# Patient Record
Sex: Female | Born: 1960
Health system: Southern US, Community
[De-identification: ages and names within clinical notes are randomized; demographics above are authoritative.]

## PROBLEM LIST (undated history)

## (undated) DIAGNOSIS — Z9889 Other specified postprocedural states: Secondary | ICD-10-CM

## (undated) DIAGNOSIS — E785 Hyperlipidemia, unspecified: Secondary | ICD-10-CM

## (undated) DIAGNOSIS — R112 Nausea with vomiting, unspecified: Secondary | ICD-10-CM

## (undated) DIAGNOSIS — M1990 Unspecified osteoarthritis, unspecified site: Secondary | ICD-10-CM

## (undated) DIAGNOSIS — N951 Menopausal and female climacteric states: Secondary | ICD-10-CM

## (undated) DIAGNOSIS — E109 Type 1 diabetes mellitus without complications: Secondary | ICD-10-CM

## (undated) DIAGNOSIS — E669 Obesity, unspecified: Secondary | ICD-10-CM

## (undated) DIAGNOSIS — E559 Vitamin D deficiency, unspecified: Secondary | ICD-10-CM

## (undated) DIAGNOSIS — E039 Hypothyroidism, unspecified: Secondary | ICD-10-CM

## (undated) DIAGNOSIS — G20 Parkinson's disease: Secondary | ICD-10-CM

## (undated) DIAGNOSIS — G20A1 Parkinson's disease without dyskinesia, without mention of fluctuations: Secondary | ICD-10-CM

## (undated) DIAGNOSIS — D649 Anemia, unspecified: Secondary | ICD-10-CM

## (undated) HISTORY — DX: Hypothyroidism, unspecified: E03.9

## (undated) HISTORY — DX: Anemia, unspecified: D64.9

## (undated) HISTORY — PX: OTHER SURGICAL HISTORY: SHX169

## (undated) HISTORY — DX: Type 1 diabetes mellitus without complications: E10.9

## (undated) HISTORY — DX: Unspecified osteoarthritis, unspecified site: M19.90

## (undated) HISTORY — DX: Vitamin D deficiency, unspecified: E55.9

## (undated) HISTORY — PX: BREAST EXCISIONAL BIOPSY: SUR124

## (undated) HISTORY — PX: APPENDECTOMY: SHX54

## (undated) HISTORY — DX: Menopausal and female climacteric states: N95.1

## (undated) HISTORY — PX: TONSILLECTOMY: SUR1361

## (undated) HISTORY — DX: Obesity, unspecified: E66.9

## (undated) HISTORY — DX: Hyperlipidemia, unspecified: E78.5

---

## 2010-10-11 LAB — HM COLONOSCOPY

## 2013-06-22 DIAGNOSIS — E039 Hypothyroidism, unspecified: Secondary | ICD-10-CM

## 2013-06-22 DIAGNOSIS — E1069 Type 1 diabetes mellitus with other specified complication: Secondary | ICD-10-CM | POA: Insufficient documentation

## 2013-06-22 DIAGNOSIS — E1065 Type 1 diabetes mellitus with hyperglycemia: Secondary | ICD-10-CM | POA: Insufficient documentation

## 2013-06-22 DIAGNOSIS — E785 Hyperlipidemia, unspecified: Secondary | ICD-10-CM

## 2013-06-22 DIAGNOSIS — E782 Mixed hyperlipidemia: Secondary | ICD-10-CM

## 2013-06-22 DIAGNOSIS — E109 Type 1 diabetes mellitus without complications: Secondary | ICD-10-CM

## 2013-06-22 DIAGNOSIS — N951 Menopausal and female climacteric states: Secondary | ICD-10-CM

## 2013-06-22 DIAGNOSIS — E66811 Obesity, class 1: Secondary | ICD-10-CM | POA: Insufficient documentation

## 2013-06-22 DIAGNOSIS — E669 Obesity, unspecified: Secondary | ICD-10-CM

## 2013-06-22 HISTORY — DX: Menopausal and female climacteric states: N95.1

## 2013-06-22 HISTORY — DX: Obesity, class 1: E66.811

## 2013-06-22 HISTORY — DX: Type 1 diabetes mellitus without complications: E10.9

## 2013-06-22 HISTORY — DX: Obesity, unspecified: E66.9

## 2013-06-22 HISTORY — DX: Hypothyroidism, unspecified: E03.9

## 2013-06-22 HISTORY — DX: Hyperlipidemia, unspecified: E78.5

## 2013-09-24 DIAGNOSIS — E559 Vitamin D deficiency, unspecified: Secondary | ICD-10-CM | POA: Insufficient documentation

## 2013-09-24 HISTORY — DX: Vitamin D deficiency, unspecified: E55.9

## 2016-04-23 LAB — HM DIABETES EYE EXAM

## 2016-07-27 ENCOUNTER — Telehealth: Payer: Self-pay | Admitting: Surgical

## 2016-07-27 NOTE — Telephone Encounter (Signed)
Pre Visit call attempted. When I called the patient she could not hear me. I ask her to call back if she could hear me.

## 2016-07-30 ENCOUNTER — Encounter: Payer: Self-pay | Admitting: Family Medicine

## 2016-07-30 ENCOUNTER — Ambulatory Visit (INDEPENDENT_AMBULATORY_CARE_PROVIDER_SITE_OTHER): Payer: 59 | Admitting: Family Medicine

## 2016-07-30 VITALS — BP 108/70 | HR 69 | Temp 97.8°F | Ht 63.5 in | Wt 182.2 lb

## 2016-07-30 DIAGNOSIS — E039 Hypothyroidism, unspecified: Secondary | ICD-10-CM

## 2016-07-30 DIAGNOSIS — E669 Obesity, unspecified: Secondary | ICD-10-CM | POA: Diagnosis not present

## 2016-07-30 DIAGNOSIS — I8390 Asymptomatic varicose veins of unspecified lower extremity: Secondary | ICD-10-CM

## 2016-07-30 DIAGNOSIS — E109 Type 1 diabetes mellitus without complications: Secondary | ICD-10-CM

## 2016-07-30 DIAGNOSIS — E559 Vitamin D deficiency, unspecified: Secondary | ICD-10-CM

## 2016-07-30 DIAGNOSIS — N951 Menopausal and female climacteric states: Secondary | ICD-10-CM

## 2016-07-30 DIAGNOSIS — E782 Mixed hyperlipidemia: Secondary | ICD-10-CM

## 2016-07-30 DIAGNOSIS — E66811 Obesity, class 1: Secondary | ICD-10-CM

## 2016-07-30 LAB — POCT GLYCOSYLATED HEMOGLOBIN (HGB A1C): Hemoglobin A1C: 7.4

## 2016-07-30 MED ORDER — LEVOTHYROXINE SODIUM 125 MCG PO TABS: ORAL_TABLET | ORAL | 3 refills | Status: DC

## 2016-07-30 MED ORDER — BLOOD GLUCOSE TEST VI STRP: ORAL_STRIP | 6 refills | Status: DC

## 2016-07-30 NOTE — Progress Notes (Signed)
Chelsea Mercado is a 56 y.o. female is here to Chelsea Mercado.   Patient Care Team: Briscoe Deutscher, DO as PCP - General (Family Medicine)   History of Present Illness:   Shaune Pascal CMA acting as scribe for Dr. Juleen China.  HPI:   Patient comes in today to establish care. She just moved to this area from [place] yesterday. She needs a referral to Endocrinology for Type 1 diabetes. She is on the pump for her diabetes. This pump is 56 year old. No other concerns today. Referral placed today.   Current symptoms: no polyuria or polydipsia, no chest pain, dyspnea or TIA's, no numbness, tingling or pain in extremities.  Taking medication compliantly without noted sided effects [x]   YES  []   NO  Episodes of hypoglycemia? []   YES  [x]   NO Maintaining a diabetic diet? [x]   YES  []   NO Trying to exercise on a regular basis? [x]   YES  []   NO  On ACE inhibitor or angiotensin II receptor blocker? [x]   YES  []   NO On Aspirin? []   YES  [x]   NO  She would like to be referred for her varicose veins. They do not give her any problems just concerned about the look. Referral placed today.   Health Maintenance Due  Topic Date Due  . Hepatitis C Screening  10/28/1960  . PNEUMOCOCCAL POLYSACCHARIDE VACCINE (1) 08/01/1962  . FOOT EXAM  08/01/1970  . OPHTHALMOLOGY EXAM  08/01/1970  . URINE MICROALBUMIN  08/01/1970  . HIV Screening  08/01/1975  . TETANUS/TDAP  08/01/1979  . PAP SMEAR  07/31/1981  . MAMMOGRAM  08/01/2010  . COLONOSCOPY  08/01/2010   PMHx, SurgHx, SocialHx, Medications, and Allergies were reviewed in the Visit Navigator and updated as appropriate.   Past Medical History:  Diagnosis Date  . Controlled type 1 diabetes mellitus without complication ([place]) 16/07/3708  . Hyperlipidemia 06/22/2013  . Obesity, Class I, BMI 30-34.9 06/22/2013  . Post menopausal syndrome 06/22/2013  . Primary hypothyroidism 06/22/2013  . Vitamin D deficiency 09/24/2013   No past surgical history on  file. No family history on file.   Social History  Substance Use Topics  . Smoking status: Never Smoker  . Smokeless tobacco: Never Used  . Alcohol use No   Current Medications and Allergies:   .  Cholecalciferol (VITAMIN D3) 5000 units CAPS, Take by mouth., Disp: , Rfl:  .  Glucose Blood (BLOOD GLUCOSE TEST STRIPS) STRP, 6-7 times daily, Disp: 100 each, Rfl: 6 .  insulin lispro (HUMALOG) 100 UNIT/ML injection, Per pump take maximum 50 units daily, Disp: , Rfl:  .  Ketoprofen 10 % CREA, by Does not apply route., Disp: , Rfl:  .  levothyroxine (SYNTHROID, LEVOTHROID) 125 MCG tablet, Take 1 tab by mouth once daily, Disp: 90 tablet, Rfl: 3 .  naproxen (NAPROSYN) 500 MG tablet, Take by mouth., Disp: , Rfl:  .  pravastatin (PRAVACHOL) 40 MG tablet, Take by mouth., Disp: , Rfl:  .  ramipril (ALTACE) 10 MG capsule, 1 tab once daily, Disp: , Rfl:   Allergies  Allergen Reactions  . Hydrocodone-Acetaminophen Nausea And Vomiting  . Propoxyphene Nausea Only   Review of Systems:   Pertinent items are noted in the HPI. Otherwise, ROS is negative.  Vitals:   Vitals:   07/30/16 1108  BP: 108/70  Pulse: 69  Temp: 97.8 F (36.6 C)  TempSrc: Oral  SpO2: 96%  Weight: 182 lb 3.2 oz (82.6 kg)  Height: 5'  3.5" (1.613 m)     Body mass index is 31.77 kg/m.  Physical Exam:   Physical Exam  Constitutional: She is oriented to person, place, and time. She appears well-developed and well-nourished. No distress.  HENT:  Head: Normocephalic and atraumatic.  Eyes: Pupils are equal, round, and reactive to light. EOM are normal.  Neck: Normal range of motion. Neck supple.  Cardiovascular: Normal rate, regular rhythm, normal heart sounds and intact distal pulses.   Pulmonary/Chest: Effort normal and breath sounds normal.  Abdominal: Soft. Bowel sounds are normal.  Musculoskeletal: Normal range of motion.  Neurological: She is alert and oriented to person, place, and time.  Skin: Skin is warm.   Psychiatric: She has a normal mood and affect. Her behavior is normal.  Nursing note and vitals reviewed.  Results for orders placed or performed in visit on 07/30/16  POCT glycosylated hemoglobin (Hb A1C)  Result Value Ref Range   Hemoglobin A1C 7.4    Assessment and Plan:   Problem  Varicose Vein of Leg   Multiple superficial veins, some of which are inflamed. No red flags. The patient would like referral to the intravascular. Referral placed today.   Vitamin D Deficiency   History of the same with a low vitamin D of 8 per the patient. She continues to take daily vitamin D. Concerns today.   Controlled Type 1 Diabetes Mellitus Without Complication ([facility])   Referral for endocrinology placed today. The patient is using an insulin pump. She has no concerns today she does not use a continuous glucose monitor. Her last A1c was documented at 7.6.  Lab Results  Component Value Date   HGBA1C 7.4 07/30/2016     Hyperlipidemia   Well controlled.  No signs of complications, medication side effects, or red flags.  Continue current regimen.      Obesity, Class I, Bmi 30-34.9   The patient is asked to make an attempt to improve diet and exercise patterns to aid in medical management of this problem.    Primary Hypothyroidism   The patient denies unintentional weight change, fatigue, skin changes, bowel changes, or other concerns. She takes her medication daily. She is not due for a recheck labs today. She does need a new prescription refill today.   Post Menopausal Syndrome (Resolved)   The patient denies any new symptoms or concerns.     . Reviewed expectations re: course of current medical issues. . Discussed self-management of symptoms. . Outlined signs and symptoms indicating need for more acute intervention. . Patient verbalized understanding and all questions were answered. Marland Kitchen Health Maintenance issues including appropriate healthy diet, exercise, and smoking avoidance were  discussed with patient. . See orders for this visit as documented in the electronic medical record. . Patient received an After Visit Summary.  CMA served as Education administrator during this visit. History, Physical, and Plan performed by medical provider. The above documentation has been reviewed and is accurate and complete. Briscoe Deutscher, D.O.  Briscoe Deutscher, DO [place] [place] 07/30/2016  Future Appointments Date Time Provider [place]  10/30/2016 9:45 AM Briscoe Deutscher, DO [facility] None

## 2016-08-03 ENCOUNTER — Encounter: Payer: Self-pay | Admitting: Surgical

## 2016-08-10 ENCOUNTER — Other Ambulatory Visit: Payer: Self-pay

## 2016-08-10 DIAGNOSIS — I8311 Varicose veins of right lower extremity with inflammation: Secondary | ICD-10-CM

## 2016-08-10 DIAGNOSIS — I8312 Varicose veins of left lower extremity with inflammation: Principal | ICD-10-CM

## 2016-08-20 ENCOUNTER — Encounter: Payer: Self-pay | Admitting: Vascular Surgery

## 2016-08-20 ENCOUNTER — Ambulatory Visit (INDEPENDENT_AMBULATORY_CARE_PROVIDER_SITE_OTHER): Payer: 59 | Admitting: Vascular Surgery

## 2016-08-20 VITALS — BP 117/81 | HR 86 | Temp 98.0°F | Resp 18 | Ht 64.0 in | Wt 185.0 lb

## 2016-08-20 DIAGNOSIS — I83893 Varicose veins of bilateral lower extremities with other complications: Secondary | ICD-10-CM | POA: Insufficient documentation

## 2016-08-20 DIAGNOSIS — I8393 Asymptomatic varicose veins of bilateral lower extremities: Secondary | ICD-10-CM

## 2016-08-20 NOTE — Progress Notes (Signed)
Subjective:     Patient ID: Chelsea Mercado, female   DOB: 1960/01/21, 56 y.o.   MRN: [id number]  HPI This 56 year old female was referred by Dr. Juleen China for evaluation of bilateral varicose veins with inflammation. The patient is a juvenile diabetic and has an insulin pump. She has no history of DVT thrombophlebitis stasis ulcers or bleeding. She does develop nocturnal leg cramps. She has had spider veins in both legs for many years but has never had any treatment. She takes no anticoagulants.  Past Medical History:  Diagnosis Date  . Anemia   . Arthritis   . Controlled type 1 diabetes mellitus without complication ([place]) 64/40/3474  . Hyperlipidemia 06/22/2013  . Obesity, Class I, BMI 30-34.9 06/22/2013  . Post menopausal syndrome 06/22/2013  . Primary hypothyroidism 06/22/2013  . Vitamin D deficiency 09/24/2013    Social History  Substance Use Topics  . Smoking status: Never Smoker  . Smokeless tobacco: Never Used  . Alcohol use No    Family History  Problem Relation Age of Onset  . Heart disease Mother   . Hypertension Mother   . High Cholesterol Mother   . Heart disease Maternal Grandfather     Allergies  Allergen Reactions  . Hydrocodone-Acetaminophen Nausea And Vomiting  . Propoxyphene Nausea Only     Current Outpatient Prescriptions:  .  Cholecalciferol (VITAMIN D3) 5000 units CAPS, Take by mouth., Disp: , Rfl:  .  Glucose Blood (BLOOD GLUCOSE TEST STRIPS) STRP, 6-7 times daily, Disp: 100 each, Rfl: 6 .  insulin lispro (HUMALOG) 100 UNIT/ML injection, Per pump take maximum 50 units daily, Disp: , Rfl:  .  Ketoprofen 10 % CREA, by Does not apply route., Disp: , Rfl:  .  levothyroxine (SYNTHROID, LEVOTHROID) 125 MCG tablet, Take 1 tab by mouth once daily, Disp: 90 tablet, Rfl: 3 .  naproxen (NAPROSYN) 500 MG tablet, Take by mouth., Disp: , Rfl:  .  pravastatin (PRAVACHOL) 40 MG tablet, Take by mouth., Disp: , Rfl:  .  ramipril (ALTACE) 10 MG capsule, 1 tab once daily,  Disp: , Rfl:   Vitals:   08/20/16 1139  BP: 117/81  Pulse: 86  Resp: 18  Temp: 98 F (36.7 C)  TempSrc: Oral  SpO2: 96%  Weight: 185 lb (83.9 kg)  Height: 5\' 4"  (1.626 m)    Body mass index is 31.76 kg/m.         Review of Systems denies chest pain, dyspnea on exertion, PND, orthopnea, hemoptysis. See history of present illness    Objective:   Physical Exam BP 117/81 (BP Location: Left Arm, Patient Position: Sitting, Cuff Size: Normal)   Pulse 86   Temp 98 F (36.7 C) (Oral)   Resp 18   Ht 5\' 4"  (1.626 m)   Wt 185 lb (83.9 kg)   SpO2 96%   BMI 31.76 kg/m     Gen.-alert and oriented x3 in no apparent distress HEENT normal for age Lungs no rhonchi or wheezing Cardiovascular regular rhythm no murmurs carotid pulses 3+ palpable no bruits audible Abdomen soft nontender no palpable masses-obese Musculoskeletal free of  major deformities Skin clear -no rashes Neurologic normal Lower extremities 3+ femoral and dorsalis pedis pulses palpable bilaterally with no edema Diffuse spider veins bilaterally in the medial lateral thighs medial lateral calf and posterior calf and popliteal fossa. No bulging varicosities noted. No hyperpigmentation or ulceration noted. Both feet well perfused.  Lap performed a bedside SonoSite ultrasound exam and visualized both great saphenous veins  which appear normal in caliber with no evidence of reflux       Assessment:     #1 bilateral spider veins #2 type 1 diabetes mellitus    Plan:     Discussed potential foam sclerotherapy with patient and she would like to explore this further and will meet with Thea Silversmith. Also explained to her that any symptoms which she is experiencing now may not resolve with treatment and that the treatment is primarily for cosmetic reasons

## 2016-09-17 ENCOUNTER — Encounter: Payer: Self-pay | Admitting: *Deleted

## 2016-09-19 ENCOUNTER — Encounter: Payer: Self-pay | Admitting: *Deleted

## 2016-09-19 ENCOUNTER — Ambulatory Visit (INDEPENDENT_AMBULATORY_CARE_PROVIDER_SITE_OTHER): Payer: Self-pay | Admitting: *Deleted

## 2016-09-19 DIAGNOSIS — I8393 Asymptomatic varicose veins of bilateral lower extremities: Secondary | ICD-10-CM

## 2016-09-19 NOTE — Progress Notes (Signed)
X=.3% Sotradecol administered with a 27g butterfly.  Patient received a total of 6cc.  Treated major areas of concern with one syringe. Easy access. Tol well. She'll need more tx in the future. Will follow this nice lady prn.    Compression stockings applied: Yes.

## 2016-09-20 ENCOUNTER — Telehealth: Payer: Self-pay | Admitting: Family Medicine

## 2016-09-20 MED ORDER — INSULIN LISPRO 100 UNIT/ML ~~LOC~~ SOLN: SUBCUTANEOUS | 1 refills | Status: DC

## 2016-09-20 NOTE — Telephone Encounter (Signed)
Medication sent into pharmacy for patient.

## 2016-09-20 NOTE — Telephone Encounter (Signed)
MEDICATION: insulin lispro (HUMALOG) 100 UNIT/ML injection [[id number]]   PHARMACY:   [id number] - [place], [place] - [address] [phone] (Phone) [phone] (Fax)    IS THIS A 90 DAY SUPPLY :  N/A  IS PATIENT OUT OF MEDICATION:  No, only one bottle left.  IF NOT; HOW MUCH IS LEFT: One bottle.   LAST APPOINTMENT DATE: @7 /23/2018  NEXT APPOINTMENT DATE:@10 /23/2018  OTHER COMMENTS:  See's endo provider on 9/24, needs enough to last till then.    **Let patient know to contact pharmacy at the end of the day to make sure medication is ready. **  ** Please notify patient to allow 48-72 hours to process**  **Encourage patient to contact the pharmacy for refills or they can request refills through [facility]**

## 2016-10-01 ENCOUNTER — Encounter: Payer: Self-pay | Admitting: Internal Medicine

## 2016-10-01 ENCOUNTER — Ambulatory Visit (INDEPENDENT_AMBULATORY_CARE_PROVIDER_SITE_OTHER): Payer: 59 | Admitting: Internal Medicine

## 2016-10-01 VITALS — BP 110/60 | HR 83 | Ht 63.5 in | Wt 183.0 lb

## 2016-10-01 DIAGNOSIS — E039 Hypothyroidism, unspecified: Secondary | ICD-10-CM

## 2016-10-01 DIAGNOSIS — E559 Vitamin D deficiency, unspecified: Secondary | ICD-10-CM

## 2016-10-01 DIAGNOSIS — E1065 Type 1 diabetes mellitus with hyperglycemia: Secondary | ICD-10-CM | POA: Diagnosis not present

## 2016-10-01 DIAGNOSIS — E041 Nontoxic single thyroid nodule: Secondary | ICD-10-CM | POA: Insufficient documentation

## 2016-10-01 LAB — LIPID PANEL
CHOLESTEROL: 187 mg/dL (ref 0–200)
HDL: 56.8 mg/dL (ref >39.00)
LDL CALC: 106 mg/dL — AB (ref 0–99)
NonHDL: 129.89
Total CHOL/HDL Ratio: 3
Triglycerides: 119 mg/dL (ref 0.0–149.0)
VLDL: 23.8 mg/dL (ref 0.0–40.0)

## 2016-10-01 LAB — MICROALBUMIN / CREATININE URINE RATIO
Creatinine,U: 140 mg/dL
MICROALB/CREAT RATIO: 0.5 mg/g (ref 0.0–30.0)

## 2016-10-01 LAB — COMPLETE METABOLIC PANEL WITH GFR
AG Ratio: 1.4 (calc) (ref 1.0–2.5)
ALKALINE PHOSPHATASE (APISO): 60 U/L (ref 33–130)
ALT: 13 U/L (ref 6–29)
AST: 18 U/L (ref 10–35)
Albumin: 4 g/dL (ref 3.6–5.1)
BILIRUBIN TOTAL: 0.4 mg/dL (ref 0.2–1.2)
BUN: 15 mg/dL (ref 7–25)
CHLORIDE: 102 mmol/L (ref 98–110)
CO2: 26 mmol/L (ref 20–32)
CREATININE: 0.93 mg/dL (ref 0.50–1.05)
Calcium: 9.3 mg/dL (ref 8.6–10.4)
GFR, Est African American: 80 mL/min/{1.73_m2} (ref >=60)
GFR, Est Non African American: 69 mL/min/{1.73_m2} (ref >=60)
GLUCOSE: 158 mg/dL — AB (ref 65–99)
Globulin: 2.9 g/dL (calc) (ref 1.9–3.7)
Potassium: 4.5 mmol/L (ref 3.5–5.3)
Sodium: 136 mmol/L (ref 135–146)
Total Protein: 6.9 g/dL (ref 6.1–8.1)

## 2016-10-01 LAB — VITAMIN D 25 HYDROXY (VIT D DEFICIENCY, FRACTURES): VITD: 47.47 ng/mL (ref 30.00–100.00)

## 2016-10-01 MED ORDER — LEVOTHYROXINE SODIUM 125 MCG PO TABS: ORAL_TABLET | ORAL | 3 refills | Status: DC

## 2016-10-01 MED ORDER — GLUCAGON (RDNA) 1 MG IJ KIT: 1.0000 mg | PACK | Freq: Once | INTRAMUSCULAR | 12 refills | Status: DC | PRN

## 2016-10-01 MED ORDER — RAMIPRIL 10 MG PO CAPS: ORAL_CAPSULE | ORAL | 3 refills | Status: DC

## 2016-10-01 NOTE — Patient Instructions (Addendum)
Please continue: - basal rates: 12 AM: 0.775 units/h 5 AM: 0.9 9 AM: 0.7 - ICR: 1:10 - target: 100-120 - ISF: 50 - Insulin on Board: 3h  Please check sugars,  enter carbs, and bolus even for snacks.  Please move Levothyroxine 125 mcg to am.  Take the thyroid hormone every day, with water, at least 30 minutes before breakfast, separated by at least 4 hours from: - acid reflux medications - calcium - iron - multivitamins  Please call and schedule an eye appt with Dr. Prudencio Burly: [facility]:  Dr. Sherlyn Lick MD ?  Address: [address], [place], [place] [address]  [phone]

## 2016-10-01 NOTE — Progress Notes (Signed)
Patient ID: Chelsea Mercado, female   DOB: Nov 20, 1960, 56 y.o.   MRN: [id number]   HPI: Chelsea Mercado is a 56 y.o.-year-old female, referred by her PCP, Dr. Juleen China, for management of DM1, uncontrolled, without long term complications and hypothyroidism. She saw endocrinology before: Dr. Cherie Dark.   Patient has been diagnosed with DM1 at 56 y/o; she started on insulin at dx.   Last hemoglobin A1c was: Lab Results  Component Value Date   HGBA1C 7.4 07/30/2016  02/14/2016: HbA1c 7.6%  Pt is on an insulin pump: Medtronic 630G (wears it on arm) - last pump: since 2016-2017, without CGM (not enough space on abdomen), uses Humalog in the pump.  Pump settings: - basal rates: 12 AM: 0.775 units/h 5 AM: 0.9 9 AM: 0.7 - ICR: 1:10 - target: 100-120 - ISF: 50 - Insulin on Board: 3h - bolus wizard: on TDD from basal insulin: 59% (18 units) TDD from bolus insulin: 41% (12 units) - extended bolusing: not using - changes infusion site: q3-4 days  - Meter: Bayer Contour  Pt checks her sugars  2.4x a day - 137 +/- 38: - am: 85-156, 179 - 2h after b'fast: n/c - before lunch: 85-168, 195, 251 - 2h after lunch: n/c - before dinner: 93-145, 216 - 2h after dinner: n/c - bedtime: 123-193 - nighttime: 79 No lows. Lowest sugar was 68; she has hypoglycemia awareness at 60s. + previous hypoglycemia ED visit x 1 many years ago. She does not have a glucagon kit at home. Highest sugar was 490 (site/infusion set pb). No previous DKA admissions.    Pt's meals are: - Breakfast: waffles, or egg, bread, coffee - Lunch: Sandwich, soup, salad, fruit, chips - Dinner: Meat, potatoes, brown rice, veggies or salad - Snacks: 1  - no CKD, last BUN/creatinine:  03/26/2015: 14/0.9 No results found for: BUN, CREATININE  On Ramipril 10. - + HL. Last set of lipids: No results found for: CHOL, HDL, LDLCALC, LDLDIRECT, TRIG, CHOLHDL  On Pravastatin 40. - last eye exam was in 03/2015 >> No DR - [place], [place] - no numbness and  tingling in her feet.  Pt has no FH of DM.  She also has hypothyroidism:  Last TSH: 02/10/2016: TSH 6.671 08/06/2015: TSH 4.35 03/26/2015: TSH 4.37 09/28/2014: TSH 2.73 No results found for: TSH  Pt is on levothyroxine 125 mcg daily, taken: - at bedtime (!): eats dinner, goes to bed at 10:30-11 pm - fasting - at least 30 min from b'fast - no Ca, Fe, MVI, PPIs - not on Biotin  She also has vitamin D deficiency.  Latest vitamin D level: 02/10/2016: vit D 31 08/06/2015: vit D 21  On vitamin D 5000 IU daily.  ROS: Constitutional: no weight gain/loss, no fatigue, no subjective hyperthermia/hypothermia Eyes: no blurry vision, no xerophthalmia ENT: no sore throat, no nodules palpated in throat, no dysphagia/odynophagia, no hoarseness Cardiovascular: no CP/SOB/palpitations/leg swelling Respiratory: no cough/SOB Gastrointestinal: no N/V/D/C Musculoskeletal: no muscle/joint aches Skin: no rashes Neurological: no tremors/numbness/tingling/dizziness Psychiatric: no depression/anxiety  Past Medical History:  Diagnosis Date  . Anemia   . Arthritis   . Controlled type 1 diabetes mellitus without complication ([place]) 56/16/9678  . Hyperlipidemia 06/22/2013  . Obesity, Class I, BMI 30-34.9 06/22/2013  . Post menopausal syndrome 06/22/2013  . Primary hypothyroidism 06/22/2013  . Vitamin D deficiency 09/24/2013   Past Surgical History:  Procedure Laterality Date  . APPENDECTOMY    . [place]   Social History   Social History  .  Marital status: Married    Spouse name: N/A  . Number of children: 3   Occupational History  . Homemaker    Social History Main Topics  . Smoking status: Never Smoker  . Smokeless tobacco: Never Used  . Alcohol use No  . Drug use: No   Current Outpatient Prescriptions on File Prior to Visit  Medication Sig Dispense Refill  . Cholecalciferol (VITAMIN D3) 5000 units CAPS Take by mouth.    . Glucose Blood (BLOOD GLUCOSE TEST  STRIPS) STRP 6-7 times daily 100 each 6  . insulin lispro (HUMALOG) 100 UNIT/ML injection Per pump take maximum 50 units daily 10 mL 1  . levothyroxine (SYNTHROID, LEVOTHROID) 125 MCG tablet Take 1 tab by mouth once daily 90 tablet 3  . pravastatin (PRAVACHOL) 40 MG tablet Take by mouth.    . ramipril (ALTACE) 10 MG capsule 1 tab once daily    . Ketoprofen 10 % CREA by Does not apply route.    . naproxen (NAPROSYN) 500 MG tablet Take by mouth.     No current facility-administered medications on file prior to visit.    Allergies  Allergen Reactions  . Hydrocodone-Acetaminophen Nausea And Vomiting  . Propoxyphene Nausea Only   Family History  Problem Relation Age of Onset  . Heart disease Mother   . Hypertension Mother   . High Cholesterol Mother   . Heart disease Maternal Grandfather     PE: BP 110/60 (BP Location: Left Arm, Patient Position: Sitting)   Pulse 83   Ht 5' 3.5" (1.613 m)   Wt 183 lb (83 kg)   SpO2 96%   BMI 31.91 kg/m  Wt Readings from Last 3 Encounters:  10/01/16 183 lb (83 kg)  08/20/16 185 lb (83.9 kg)  07/30/16 182 lb 3.2 oz (82.6 kg)   Constitutional: overweight, in NAD Eyes: PERRLA, EOMI, no exophthalmos ENT: moist mucous membranes, no thyromegaly + L lobe and isthmus possible thyroid nodule , no cervical lymphadenopathy Cardiovascular: RRR, No MRG Respiratory: CTA B Gastrointestinal: abdomen soft, NT, ND, BS+ Musculoskeletal: no deformities, strength intact in all 4 Skin: moist, warm, no rashes Neurological: no tremor with outstretched hands, DTR normal in all 4  ASSESSMENT: 1. DM1, uncontrolled, without long term complications, but with hyperglycemia  2. Hypothyroidism  3. Thyroid nodule  4. Vit D def  PLAN:  1. Patient with long-standing DM1, on insulin pump therapy. She is doing exceptionally well considering her long duration of diabetes. My suspicion is that she has some insulin production from the pancreas - she has better control  especially in the last 2 weeks that I was able to review. We downloaded her insulin pump and reviewed the reports together with the patient. Her sugars are at or very close to goal the majority of the time, with few hyperglycemic spikes. She is mostly doing a good job entering the carbs in her pump with every meal and bolusing for them, however, at night, she boluses for snacks without introducing carbs or checking sugars. Subsequently, sugars in the morning may be higher than target. I strongly advised her to start checking sugars and putting in the carbs for snacks, also. She rarely misses checking her sugars and introducing the carbs with dinner, and I advised her to also start doing so. Otherwise, I feel her insulin pump settings are very good, and no other changes are needed.  - She is also doing a great job changing her infusion site every 3 days mostly, occasionally every  4 days - She is doing insulin injections she had a nonfunctioning site  - I advised her to:  Patient Instructions  Please continue: - basal rates: 12 AM: 0.775 units/h 5 AM: 0.9 9 AM: 0.7 - ICR: 1:10 - target: 100-120 - ISF: 50 - Insulin on Board: 3h  Please check sugars,  enter carbs, and bolus even for snacks.  Please move Levothyroxine 125 mcg to am.  Take the thyroid hormone every day, with water, at least 30 minutes before breakfast, separated by at least 4 hours from: - acid reflux medications - calcium - iron - multivitamins  Please call and schedule an eye appt with Dr. Prudencio Burly: [facility]:  Dr. Sherlyn Lick MD ?  Address: [address], [place], [place] [address]  [phone]  - continue checking sugars at different times of the day - check at least 4 times a day, rotating checks - given sugar log and advised how to fill it and to bring it at next appt  - given foot care handout and explained the principles  - given instructions for hypoglycemia management "15-15 rule"   - advised for yearly eye exams >> she will need a local doctor so I recommended Dr. Prudencio Burly  - sent 1x glucagon kit Rx to pharmacy - Will check a CMP, ACR, and lipid panel today. She is not fasting. - advised to always have Glu tablets with her - Return to clinic in 3 mo with sugar log   2.  hypothyroidism  - Last TSH level was high, with the previous ones being at the upper limit of normal  - We discussed that taking the levothyroxine at bedtime is not a good idea, since there is decreased absorption of the medication after dinner - I advised her to move the levothyroxine in the morning and she agrees to restart this - For now, will continue the current dose of levothyroxine, 125 g daily-   - I advised her to take the thyroid hormone every day, with water, at least 30 minutes before breakfast, separated by at least 4 hours from: - acid reflux medications - calcium - iron - multivitamins - Will recheck her TFTs at next visit, after we move levothyroxine in the morning  - Refilled her levothyroxine prescription  3. Thyroid nodule - Possible left-sided thyroid nodule versus left lobe enlargement and also possible isthmic smaller nodule. I plan to check a thyroid ultrasound at next visit. She agrees with this plan. - no neck compression sxs  4.Vit D def -  We'll check a vitamin D level today -  continue current dose of vitamin D, 5000 units daily   Orders Placed This Encounter  Procedures  . Vitamin D, 25-hydroxy  . Microalbumin / creatinine urine ratio  . COMPLETE METABOLIC PANEL WITH GFR  . Lipid panel   - time spent with the patient: 60 min of which >50% was spent in reviewing her pump downloads, discussing her blood sugar patterns, reviewing previous labs and pump settings and developing a plan to avoid hypo- and hyper-glycemia. We also addressed the rest of her endocrine problems: Hypothyroidism, new diagnosis of thyroid nodule, also vitamin D deficiency - please see the  discussed topics above.   Office Visit on 10/01/2016  Component Date Value Ref Range Status  . VITD 10/01/2016 47.47  30.00 - 100.00 ng/mL Final  . Microalb, Ur 10/01/2016 <0.7  0.0 - 1.9 mg/dL Final  . Creatinine,U 10/01/2016 140.0  mg/dL Final  .  Microalb Creat Ratio 10/01/2016 0.5  0.0 - 30.0 mg/g Final  . Glucose, Bld 10/01/2016 158* 65 - 99 mg/dL Final   Comment: .            Fasting reference interval . For someone without known diabetes, a glucose value >125 mg/dL indicates that they may have diabetes and this should be confirmed with a follow-up test. .   . BUN 10/01/2016 15  7 - 25 mg/dL Final  . Creat 10/01/2016 0.93  0.50 - 1.05 mg/dL Final   Comment: For patients >1 years of age, the reference limit for Creatinine is approximately 13% higher for people identified as African-American. .   . GFR, Est Non African American 10/01/2016 69  > OR = 60 mL/min/1.79m Final  . GFR, Est African American 10/01/2016 80  > OR = 60 mL/min/1.712mFinal  . BUN/Creatinine Ratio 0993/23/5573OT APPLICABLE  6 - 22 (calc) Final  . Sodium 10/01/2016 136  135 - 146 mmol/L Final  . Potassium 10/01/2016 4.5  3.5 - 5.3 mmol/L Final  . Chloride 10/01/2016 102  98 - 110 mmol/L Final  . CO2 10/01/2016 26  20 - 32 mmol/L Final  . Calcium 10/01/2016 9.3  8.6 - 10.4 mg/dL Final  . Total Protein 10/01/2016 6.9  6.1 - 8.1 g/dL Final  . Albumin 10/01/2016 4.0  3.6 - 5.1 g/dL Final  . Globulin 10/01/2016 2.9  1.9 - 3.7 g/dL (calc) Final  . AG Ratio 10/01/2016 1.4  1.0 - 2.5 (calc) Final  . Total Bilirubin 10/01/2016 0.4  0.2 - 1.2 mg/dL Final  . Alkaline phosphatase (APISO) 10/01/2016 60  33 - 130 U/L Final  . AST 10/01/2016 18  10 - 35 U/L Final  . ALT 10/01/2016 13  6 - 29 U/L Final  . Cholesterol 10/01/2016 187  0 - 200 mg/dL Final   ATP III Classification       Desirable:  < 200 mg/dL               Borderline High:  200 - 239 mg/dL          High:  > = 240 mg/dL  . Triglycerides 10/01/2016 119.0   0.0 - 149.0 mg/dL Final   Normal:  <150 mg/dLBorderline High:  150 - 199 mg/dL  . HDL 10/01/2016 56.80  >39.00 mg/dL Final  . VLDL 10/01/2016 23.8  0.0 - 40.0 mg/dL Final  . LDL Cholesterol 10/01/2016 106* 0 - 99 mg/dL Final  . Total CHOL/HDL Ratio 10/01/2016 3   Final                  Men          Women1/2 Average Risk     3.4          3.3Average Risk          5.0          4.42X Average Risk          9.6          7.13X Average Risk          15.0          11.0                      . NonHDL 10/01/2016 129.89   Final   NOTE:  Non-HDL goal should be 30 mg/dL higher than patient's LDL goal (i.e. LDL goal of < 70 mg/dL, would have non-HDL goal  of < 100 mg/dL)    [name], MD PhD [facility] Endocrinology

## 2016-10-02 ENCOUNTER — Telehealth: Payer: Self-pay

## 2016-10-02 NOTE — Telephone Encounter (Signed)
LVM, gave lab results. Gave call back number if any questions or concerns.  

## 2016-10-02 NOTE — Telephone Encounter (Signed)
-----   Message from [name], MD sent at 10/02/2016  9:25 AM EDT ----- Chelsea Mercado, can you please call pt: Labs are all OK except for slightly high Glucose and slightly high LDL-cholesterol.

## 2016-10-04 ENCOUNTER — Encounter (HOSPITAL_COMMUNITY): Payer: 59

## 2016-10-04 ENCOUNTER — Encounter: Payer: 59 | Admitting: Vascular Surgery

## 2016-10-30 ENCOUNTER — Encounter: Payer: Self-pay | Admitting: Family Medicine

## 2016-10-30 ENCOUNTER — Ambulatory Visit (INDEPENDENT_AMBULATORY_CARE_PROVIDER_SITE_OTHER): Payer: 59 | Admitting: Family Medicine

## 2016-10-30 VITALS — BP 112/70 | HR 81 | Temp 97.9°F | Ht 63.5 in | Wt 183.8 lb

## 2016-10-30 DIAGNOSIS — E669 Obesity, unspecified: Secondary | ICD-10-CM | POA: Diagnosis not present

## 2016-10-30 DIAGNOSIS — Z1239 Encounter for other screening for malignant neoplasm of breast: Secondary | ICD-10-CM

## 2016-10-30 DIAGNOSIS — L821 Other seborrheic keratosis: Secondary | ICD-10-CM

## 2016-10-30 DIAGNOSIS — Z1231 Encounter for screening mammogram for malignant neoplasm of breast: Secondary | ICD-10-CM

## 2016-10-30 DIAGNOSIS — E1065 Type 1 diabetes mellitus with hyperglycemia: Secondary | ICD-10-CM

## 2016-10-30 DIAGNOSIS — Z23 Encounter for immunization: Secondary | ICD-10-CM

## 2016-10-30 NOTE — Progress Notes (Signed)
Chelsea Mercado is a 56 y.o. female is here for follow up.  History of Present Illness:   Shaune Pascal CMA acting as scribe for Dr. Juleen China.  HPI: Patient comes in today for a follow up on her diabetes. She has one concern today about two spots on her back. She stated that they have gotten lighter in color.  1. Type 1 diabetes mellitus with hyperglycemia ([place]).   Current symptoms: no polyuria or polydipsia, no chest pain, dyspnea or TIA's, no numbness, tingling or pain in extremities.  Taking medication compliantly without noted sided effects [x]   YES  []   NO  Episodes of hypoglycemia? []   YES  [x]   NO Maintaining a diabetic diet? [x]   YES  []   NO Trying to exercise on a regular basis? [x]   YES  []   NO  On ACE inhibitor or angiotensin II receptor blocker? [x]   YES  []   NO On Aspirin? []   YES  [x]   NO  Lab Results  Component Value Date   HGBA1C 7.4 07/30/2016    Lab Results  Component Value Date   MICROALBUR <0.7 10/01/2016    Lab Results  Component Value Date   CHOL 187 10/01/2016   HDL 56.80 10/01/2016   LDLCALC 106 (H) 10/01/2016   TRIG 119.0 10/01/2016   CHOLHDL 3 10/01/2016     Wt Readings from Last 3 Encounters:  10/30/16 183 lb 12.8 oz (83.4 kg)  10/01/16 183 lb (83 kg)  08/20/16 185 lb (83.9 kg)   BP Readings from Last 3 Encounters:  10/30/16 112/70  10/01/16 110/60  08/20/16 117/81   Lab Results  Component Value Date   CREATININE 0.93 10/01/2016     Health Maintenance Due  Topic Date Due  . Hepatitis C Screening  [birth year]  . PNEUMOCOCCAL POLYSACCHARIDE VACCINE (1) 08/01/1962  . FOOT EXAM  08/01/1970  . OPHTHALMOLOGY EXAM  08/01/1970  . HIV Screening  08/01/1975  . TETANUS/TDAP  08/01/1979  . MAMMOGRAM  08/01/2010  . COLONOSCOPY  08/01/2010   Depression screen PHQ 2/9 10/30/2016  Decreased Interest 0  Down, Depressed, Hopeless 0  PHQ - 2 Score 0   PMHx, SurgHx, SocialHx, FamHx, Medications, and Allergies were reviewed in the Visit Navigator  and updated as appropriate.   Patient Active Problem List   Diagnosis Date Noted  . Left thyroid nodule 10/01/2016  . Varicose veins of bilateral lower extremities with other complications 26/33/3545  . Spider veins of both lower extremities 08/20/2016  . Varicose vein of leg 07/30/2016  . Vitamin D deficiency 09/24/2013  . Type 1 diabetes mellitus with hyperglycemia ([place]) 06/22/2013  . Hyperlipidemia 06/22/2013  . Obesity, Class I, BMI 30-34.9 06/22/2013  . Primary hypothyroidism 06/22/2013   Social History  Substance Use Topics  . Smoking status: Never Smoker  . Smokeless tobacco: Never Used  . Alcohol use No   Current Medications and Allergies:   .  Cholecalciferol (VITAMIN D3) 5000 units CAPS, Take by mouth., Disp: , Rfl:  .  glucagon (GLUCAGON EMERGENCY) 1 MG injection, Inject 1 mg into the muscle once as needed., Disp: 1 each, Rfl: 12 .  Glucose Blood (BLOOD GLUCOSE TEST STRIPS) STRP, 6-7 times daily, Disp: 100 each, Rfl: 6 .  insulin lispro (HUMALOG) 100 UNIT/ML injection, Per pump take maximum 50 units daily, Disp: 10 mL, Rfl: 1 .  Ketoprofen 10 % CREA, by Does not apply route., Disp: , Rfl:  .  levothyroxine (SYNTHROID, LEVOTHROID) 125 MCG tablet, Take 1 tab  by mouth once daily, Disp: 90 tablet, Rfl: 3 .  naproxen (NAPROSYN) 500 MG tablet, Take by mouth., Disp: , Rfl:  .  pravastatin (PRAVACHOL) 40 MG tablet, Take by mouth., Disp: , Rfl:  .  ramipril (ALTACE) 10 MG capsule, 1 tab once daily, Disp: 90 capsule, Rfl: 3   Allergies  Allergen Reactions  . Hydrocodone-Acetaminophen Nausea And Vomiting  . Propoxyphene Nausea Only   Review of Systems   Pertinent items are noted in the HPI. Otherwise, ROS is negative.  Vitals:   Vitals:   10/30/16 0956  BP: 112/70  Pulse: 81  Temp: 97.9 F (36.6 C)  TempSrc: Oral  SpO2: 98%  Weight: 183 lb 12.8 oz (83.4 kg)  Height: 5' 3.5" (1.613 m)     Body mass index is 32.05 kg/m.   Physical Exam:   Physical Exam    Constitutional: She appears well-nourished.  HENT:  Head: Normocephalic and atraumatic.  Eyes: Pupils are equal, round, and reactive to light. EOM are normal.  Neck: Normal range of motion. Neck supple.  Cardiovascular: Normal rate, regular rhythm, normal heart sounds and intact distal pulses.   Pulmonary/Chest: Effort normal.  Abdominal: Soft.  Skin: Skin is warm.  Two benign SK on back.  Psychiatric: She has a normal mood and affect. Her behavior is normal.  Nursing note and vitals reviewed.   Assessment and Plan:   Diagnoses and all orders for this visit:  Type 1 diabetes mellitus with hyperglycemia ([place]) Comments: A1c 7.4 at last visit. She will be seeing Dr. Cruzita Lederer on 01/02/17. Will wait and let A1c be rechecked at that point.   Screening for breast cancer -     MM SCREENING BREAST TOMO BILATERAL; Future  Need for immunization against influenza -     Flu Vaccine QUAD 36+ mos IM  Obesity, Class I, BMI 30-34.9 Comments: The patient is asked to make an attempt to improve diet and exercise patterns to aid in medical management of this problem.   Seborrheic keratoses Comments: No red flags. Will monitor for changes.   . Reviewed expectations re: course of current medical issues. . Discussed self-management of symptoms. . Outlined signs and symptoms indicating need for more acute intervention. . Patient verbalized understanding and all questions were answered. Marland Kitchen Health Maintenance issues including appropriate healthy diet, exercise, and smoking avoidance were discussed with patient. . See orders for this visit as documented in the electronic medical record. . Patient received an After Visit Summary.  CMA served as Education administrator during this visit. History, Physical, and Plan performed by medical provider. The above documentation has been reviewed and is accurate and complete. Briscoe Deutscher, D.O.  Briscoe Deutscher, DO [place] [place] 11/03/2016  Future  Appointments Date Time Provider [place]  11/19/2016 2:00 PM [facility] NURSE [facility] None  11/27/2016 11:00 AM [facility] MM 3 [facility] GI-BREAST CE  01/02/2017 10:30 AM [name], MD [facility] None  04/30/2017 10:00 AM Briscoe Deutscher, DO [facility] None

## 2016-11-02 ENCOUNTER — Telehealth: Payer: Self-pay | Admitting: *Deleted

## 2016-11-02 NOTE — Telephone Encounter (Signed)
Patient called concerned with an area at site of sclerotherapy stating that it appears bruised and somewhat raised.  Thea Silversmith, RN  Is calling patient to triage.

## 2016-11-09 ENCOUNTER — Other Ambulatory Visit: Payer: Self-pay | Admitting: Family Medicine

## 2016-11-19 ENCOUNTER — Ambulatory Visit (INDEPENDENT_AMBULATORY_CARE_PROVIDER_SITE_OTHER): Payer: 59

## 2016-11-19 DIAGNOSIS — Z23 Encounter for immunization: Secondary | ICD-10-CM | POA: Diagnosis not present

## 2016-11-27 ENCOUNTER — Ambulatory Visit
Admission: RE | Admit: 2016-11-27 | Discharge: 2016-11-27 | Disposition: A | Discharge status: Home / Self Care | Payer: 59 | Source: Ambulatory Visit | Attending: Family Medicine | Admitting: Family Medicine

## 2016-11-27 DIAGNOSIS — Z1239 Encounter for other screening for malignant neoplasm of breast: Secondary | ICD-10-CM

## 2016-12-07 ENCOUNTER — Telehealth: Payer: Self-pay | Admitting: Internal Medicine

## 2016-12-07 NOTE — Telephone Encounter (Signed)
Please decrease the basal rates as follows for the duration of her stomach problems: - basal rates: 12 AM: 0.775 units/h >> 0.7 5 AM: 0.9 >> 0.8 9 AM: 0.7 >> 0.6  The rest of the settings can stay the same for now - Insulin to carb ratio: 1:10 - target: 100-120 - Sensitivity factor: 50 - Insulin on Board: 3h

## 2016-12-07 NOTE — Telephone Encounter (Signed)
Pt is aware and is fixing her rates

## 2016-12-07 NOTE — Telephone Encounter (Signed)
Pt stated that she has had the stomach virus all week, today is her first "good" day.  12/05/16 7:55-119 10:33-124 12:56 120 6:38p-89 10:25p-62 12/06/16 11:43-69 12:19-80 12:47-96 1:20-149 12/07/16 10:50-77 12:25-123 3:15p-72 3:30p-78 4:17p-98  Would like to know your opinion on what to do about her bottoming out while she is dealing with this diarrhea and such from the stomach bug, please advise

## 2016-12-07 NOTE — Telephone Encounter (Signed)
Patient wants you to call her re:she has been sick-blood sugar dropping a lot ph# [phone]

## 2016-12-14 ENCOUNTER — Other Ambulatory Visit: Payer: Self-pay | Admitting: Family Medicine

## 2016-12-24 ENCOUNTER — Ambulatory Visit: Payer: 59 | Admitting: Family Medicine

## 2016-12-27 ENCOUNTER — Other Ambulatory Visit: Payer: Self-pay

## 2016-12-27 MED ORDER — INSULIN LISPRO 100 UNIT/ML ~~LOC~~ SOLN: SUBCUTANEOUS | 1 refills | Status: DC

## 2017-01-02 ENCOUNTER — Encounter: Payer: Self-pay | Admitting: Internal Medicine

## 2017-01-02 ENCOUNTER — Ambulatory Visit (INDEPENDENT_AMBULATORY_CARE_PROVIDER_SITE_OTHER): Payer: 59 | Admitting: Internal Medicine

## 2017-01-02 VITALS — BP 130/70 | HR 79 | Ht 63.5 in | Wt 181.4 lb

## 2017-01-02 DIAGNOSIS — E559 Vitamin D deficiency, unspecified: Secondary | ICD-10-CM

## 2017-01-02 DIAGNOSIS — E1065 Type 1 diabetes mellitus with hyperglycemia: Secondary | ICD-10-CM

## 2017-01-02 DIAGNOSIS — E041 Nontoxic single thyroid nodule: Secondary | ICD-10-CM

## 2017-01-02 DIAGNOSIS — E039 Hypothyroidism, unspecified: Secondary | ICD-10-CM | POA: Diagnosis not present

## 2017-01-02 LAB — T4, FREE: Free T4: 1.54 ng/dL (ref 0.60–1.60)

## 2017-01-02 LAB — POCT GLYCOSYLATED HEMOGLOBIN (HGB A1C): HEMOGLOBIN A1C: 7.9

## 2017-01-02 LAB — TSH: TSH: 1.06 u[IU]/mL (ref 0.35–4.50)

## 2017-01-02 MED ORDER — PRAVASTATIN SODIUM 40 MG PO TABS: 40.0000 mg | ORAL_TABLET | Freq: Every day | ORAL | 3 refills | Status: DC

## 2017-01-02 MED ORDER — INSULIN GLARGINE 100 UNIT/ML SOLOSTAR PEN: 20.0000 [IU] | PEN_INJECTOR | Freq: Every day | SUBCUTANEOUS | 11 refills | Status: DC

## 2017-01-02 MED ORDER — PEN NEEDLES 32G X 4 MM MISC: 1.0000 | Freq: Every day | 11 refills | Status: DC

## 2017-01-02 NOTE — Progress Notes (Signed)
Patient ID: Chelsea Mercado, female   DOB: 1960-07-07, 56 y.o.   MRN: [id number]   HPI: Chelsea Mercado is a 56 y.o.-year-old female, returning for f/u for DM1, dx'ed at 56 y/o, uncontrolled, without long term complications and hypothyroidism. She saw endocrinology before: Dr. Cherie Dark.  Last visit with me 3 months ago.    Since last visit, she had gastroenteritis >> we had to decrease her basal rates 2/2 hypoglycemia. Since then, she increased them back in last week.   Last hemoglobin A1c was: Lab Results  Component Value Date   HGBA1C 7.4 07/30/2016  02/14/2016: HbA1c 7.6%  Pt is on an insulin pump: Medtronic 630 G (wears it on arm) - last pump: since 2016-2017, without CGM (not enough space on abdomen), uses Humalog in the pump.  Pump settings: - basal rates: 12 AM: 0.775 units/h 5 AM: 0.9 9 AM: 0.7 - ICR: 1:10 >> 1:12 - target: 100-120 - ISF: 50 - Insulin on Board: 3h - bolus wizard: on TDD from basal insulin: 59% (18 units) >> 58% TDD from bolus insulin: 41% (12 units) >> 42% - extended bolusing: not using - changes infusion site: q 3-4 days  - Meter: Bayer Contour  Pt checks her sugars  3x a day - 137 +/- 38 >> 156 +/- 37: - am: 85-156, 179 >> 97-163, 186, 203 - 2h after b'fast: n/c >> 208 - before lunch: 85-168, 195, 251 >> 94-177 - 2h after lunch: n/c  - before dinner: 93-145, 216 >> 103-190 - 2h after dinner: n/c - bedtime: 123-193 >> 189-203 - nighttime: 79 Lowest sugar was 68 >> 94; she has hypoglycemia awareness at 60s. She had previous hypoglycemia ED visit x 1 many years ago. She has a glucagon kit at home. Highest sugar was 490 (site/infusion set pb) >> 203. No previous DKA admissions.    Pt's meals are: - Breakfast: waffles, or egg, bread, coffee - Lunch: Sandwich, soup, salad, fruit, chips - Dinner: Meat, potatoes, brown rice, veggies or salad - Snacks: 1  - No CKD, last BUN/creatinine:  Lab Results  Component Value Date   BUN 15 10/01/2016   CREATININE 0.93  10/01/2016   On Ramipril 10. - + HL. Last set of lipids: Lab Results  Component Value Date   CHOL 187 10/01/2016   HDL 56.80 10/01/2016   LDLCALC 106 (H) 10/01/2016   TRIG 119.0 10/01/2016   CHOLHDL 3 10/01/2016   On Pravastatin 40 - last eye exam was in 03/2016 >> No DR - [place], [place] - no numbness and tingling in her feet.  Pt has no FH of DM.  She also has hypothyroidism:  Last TSH: 02/10/2016: TSH 6.671 08/06/2015: TSH 4.35 03/26/2015: TSH 4.37 09/28/2014: TSH 2.73 No results found for: TSH  Pt is on levothyroxine 125 mcg daily, taken: - in am now, moved from bedtime at last visit - fasting - at least 30 min from b'fast - no Ca, Fe, MVI, PPIs - not on Biotin  She also has vitamin D deficiency:  Latest vitamin D level - normal: Lab Results  Component Value Date   VD25OH 47.47 10/01/2016  Prev: 02/10/2016: vit D 31 08/06/2015: vit D 21  On vitamin D 5000 IU daily.  ROS: Constitutional: no weight gain/no weight loss, no fatigue, no subjective hyperthermia, no subjective hypothermia Eyes: no blurry vision, no xerophthalmia ENT: no sore throat, no nodules palpated in throat, no dysphagia, no odynophagia, no hoarseness Cardiovascular: no CP/no SOB/no palpitations/no leg swelling Respiratory: no cough/no  SOB/no wheezing Gastrointestinal: no N/no V/no D/no C/no acid reflux Musculoskeletal: no muscle aches/no joint aches Skin: no rashes, no hair loss Neurological: no tremors/no numbness/no tingling/no dizziness  I reviewed pt's medications, allergies, PMH, social hx, family hx, and changes were documented in the history of present illness. Otherwise, unchanged from my initial visit note.  Past Medical History:  Diagnosis Date  . Anemia   . Arthritis   . Controlled type 1 diabetes mellitus without complication ([place]) 35/45/5656  . Hyperlipidemia 06/22/2013  . Obesity, Class I, BMI 30-34.9 06/22/2013  . Post menopausal syndrome 06/22/2013  . Primary  hypothyroidism 06/22/2013  . Vitamin D deficiency 09/24/2013   Past Surgical History:  Procedure Laterality Date  . APPENDECTOMY    . [place]   Social History   Social History  . Marital status: Married    Spouse name: N/A  . Number of children: 3   Occupational History  . Homemaker    Social History Main Topics  . Smoking status: Never Smoker  . Smokeless tobacco: Never Used  . Alcohol use No  . Drug use: No   Current Outpatient Medications on File Prior to Visit  Medication Sig Dispense Refill  . Cholecalciferol (VITAMIN D3) 5000 units CAPS Take by mouth.    Marland Kitchen glucagon (GLUCAGON EMERGENCY) 1 MG injection Inject 1 mg into the muscle once as needed. 1 each 12  . Glucose Blood (BLOOD GLUCOSE TEST STRIPS) STRP 6-7 times daily 100 each 6  . insulin lispro (HUMALOG) 100 UNIT/ML injection PER PUMP TAKE MAXIMUM 50 UNITS DAILY 10 mL 1  . Ketoprofen 10 % CREA by Does not apply route.    Marland Kitchen levothyroxine (SYNTHROID, LEVOTHROID) 125 MCG tablet Take 1 tab by mouth once daily 90 tablet 3  . naproxen (NAPROSYN) 500 MG tablet Take by mouth.    . pravastatin (PRAVACHOL) 40 MG tablet Take by mouth.    . ramipril (ALTACE) 10 MG capsule 1 tab once daily 90 capsule 3   No current facility-administered medications on file prior to visit.    Allergies  Allergen Reactions  . Hydrocodone-Acetaminophen Nausea And Vomiting  . Propoxyphene Nausea Only   Family History  Problem Relation Age of Onset  . Heart disease Mother   . Hypertension Mother   . High Cholesterol Mother   . Heart disease Maternal Grandfather    PE: BP 130/70   Pulse 79   Ht 5' 3.5" (1.613 m)   Wt 181 lb 6.4 oz (82.3 kg)   SpO2 98%   BMI 31.63 kg/m  Wt Readings from Last 3 Encounters:  01/02/17 181 lb 6.4 oz (82.3 kg)  10/30/16 183 lb 12.8 oz (83.4 kg)  10/01/16 183 lb (83 kg)   Constitutional: overweight, in NAD Eyes: PERRLA, EOMI, no exophthalmos ENT: moist mucous membranes, + L lobe and  isthmus possible thyroid nodule, no cervical lymphadenopathy Cardiovascular: RRR, No MRG Respiratory: CTA B Gastrointestinal: abdomen soft, NT, ND, BS+ Musculoskeletal: no deformities, strength intact in all 4 Skin: moist, warm, no rashes Neurological: no tremor with outstretched hands, DTR normal in all 4  ASSESSMENT: 1. DM1, uncontrolled, without long term complications, but with hyperglycemia  2. Hypothyroidism  3. Thyroid nodule  4. Vit D def  PLAN:  1. Patient with long-standing, uncontrolled, type 1 diabetes, on insulin pump therapy.  She is usually doing a good job with entering her carbs in her pump and bolusing for meals.  However, in the last few months, her meter  did not communicate with the pump and she had to introduce the sugars manually in the pump.  She introduced them selectively, therefore, the average glucose for the last 2 weeks is 156, however, her HbA1c today is 7.9% (higher than before and also higher than expected from her pump downloads). - We had to decrease her basal rates a month ago when she had gastroenteritis and she could not eat well, therefore, she had more hypoglycemia.  Since then, her GI symptoms resolved and she actually went back to her previous basal rates approximately 1 week ago, his sugars were higher. - Since last visit, during her episode of gastroenteritis, she also increased her insulin to carb ratios from 1:10 to 1:12.  At this visit, since her sugars are slightly higher, I advised her to go back to the 1:10 ICRS. - She is doing a great job changing her infusion sites every 3 days - at today's visit, I gave her a written prescription for Lantus and pen needles in case her pump fails - I also referred her to diabetes education to see if they can help with meter-pump communication problem - I advised her to:  Patient Instructions  Please change: - basal rates: 12 AM: 0.775 units/h 5 AM: 0.9 9 AM: 0.7 - ICR: 1:12 >> 1:10 - target:  100-120 - ISF: 50 - Insulin on Board: 3h  Please continue Levothyroxine 125 mcg to am.  Take the thyroid hormone every day, with water, at least 30 minutes before breakfast, separated by at least 4 hours from: - acid reflux medications - calcium - iron - multivitamins  Please call and schedule an eye appt with Dr. Prudencio Burly: [facility]:  Dr. Sherlyn Lick MD ?  Address: [address], [place], [place] [address]  [phone]  Please look up: Christa See - The Engine 2 diet   Rip Esselstyn - The Engine 2 7-day Rescue diet  - continue checking sugars at different times of the day - check 4x a day, rotating checks - advised for yearly eye exams >> she is UTD - Return to clinic in 3 mo with sugar log   2.  hypothyroidism  - latest thyroid labs reviewed with pt >> high, 10 mo ago - she continues on LT4 125 mcg daily - pt feels good on this dose. - we discussed about taking the thyroid hormone every day, with water, >30 minutes before breakfast, separated by >4 hours from acid reflux medications, calcium, iron, multivitamins. Pt. is taking it correctly, now moved it to am. - will check thyroid tests today: TSH and fT4 - If labs are abnormal, she will need to return for repeat TFTs in 1.5 months - OTW, RTC in 3 mo  3. Thyroid nodule - She has a possible left-sided thyroid nodule versus left lobe enlargement and also possible isthmic smaller nodule. - No neck compression symptoms - We will check a thyroid ultrasound at next visit  4.Vit D def -  Continues 5000 IU vit D daily -  Latest level great 3 mo ago  Orders Placed This Encounter  Procedures  . TSH  . T4, free  . Ambulatory referral to diabetic education  . POCT glycosylated hemoglobin (Hb A1C)   Component     Latest Ref Rng & Units 01/02/2017  TSH     0.35 - 4.50 uIU/mL 1.06  T4,Free(Direct)     0.60 - 1.60 ng/dL 1.54  Normal TFTs. [name], MD PhD [facility]

## 2017-01-02 NOTE — Patient Instructions (Addendum)
Please change: - basal rates: 12 AM: 0.775 units/h 5 AM: 0.9 9 AM: 0.7 - ICR: 1:12 >> 1:10 - target: 100-120 - ISF: 50 - Insulin on Board: 3h  Please continue Levothyroxine 125 mcg to am.  Take the thyroid hormone every day, with water, at least 30 minutes before breakfast, separated by at least 4 hours from: - acid reflux medications - calcium - iron - multivitamins  Please call and schedule an eye appt with Dr. Prudencio Burly: [facility]:  Dr. Sherlyn Lick MD ?  Address: [address], [place], [place] [address]  [phone]  Please look up: Christa See - The Engine 2 diet   Rip Cyd Silence - The Engine 2 7-day Rescue diet

## 2017-01-07 ENCOUNTER — Encounter: Payer: Self-pay | Admitting: Family Medicine

## 2017-01-07 ENCOUNTER — Ambulatory Visit: Payer: 59 | Admitting: Family Medicine

## 2017-01-07 ENCOUNTER — Other Ambulatory Visit: Payer: Self-pay | Admitting: Family Medicine

## 2017-01-07 VITALS — BP 126/74 | HR 96 | Temp 97.9°F | Ht 63.5 in | Wt 180.6 lb

## 2017-01-07 DIAGNOSIS — B354 Tinea corporis: Secondary | ICD-10-CM | POA: Diagnosis not present

## 2017-01-07 MED ORDER — KETOCONAZOLE 2 % EX CREA: 1.0000 "application " | TOPICAL_CREAM | Freq: Every day | CUTANEOUS | 0 refills | Status: DC

## 2017-01-07 NOTE — Progress Notes (Signed)
Chelsea Mercado is a 56 y.o. female here for an acute visit.  History of Present Illness:   Shaune Pascal CMA acting as scribe for Dr. Juleen China.  HPI: Patient comes in today for a sore on the back left thigh. She first noticed before thanksgiving. She has put neosporin on it without any relief. She denies any drainage.   PMHx, SurgHx, SocialHx, Medications, and Allergies were reviewed in the Visit Navigator and updated as appropriate.  Current Medications:   Current Outpatient Medications:  .  Cholecalciferol (VITAMIN D3) 5000 units CAPS, Take by mouth., Disp: , Rfl:  .  glucagon (GLUCAGON EMERGENCY) 1 MG injection, Inject 1 mg into the muscle once as needed., Disp: 1 each, Rfl: 12 .  Glucose Blood (BLOOD GLUCOSE TEST STRIPS) STRP, 6-7 times daily, Disp: 100 each, Rfl: 6 .  Insulin Glargine (LANTUS SOLOSTAR) 100 UNIT/ML Solostar Pen, Inject 20 Units into the skin daily at 10 pm., Disp: 5 pen, Rfl: 11 .  insulin lispro (HUMALOG) 100 UNIT/ML injection, PER PUMP TAKE MAXIMUM 50 UNITS DAILY, Disp: 10 mL, Rfl: 1 .  Insulin Pen Needle (PEN NEEDLES) 32G X 4 MM MISC, 1 each by Does not apply route daily., Disp: 100 each, Rfl: 11 .  Ketoprofen 10 % CREA, by Does not apply route., Disp: , Rfl:  .  levothyroxine (SYNTHROID, LEVOTHROID) 125 MCG tablet, Take 1 tab by mouth once daily, Disp: 90 tablet, Rfl: 3 .  naproxen (NAPROSYN) 500 MG tablet, Take by mouth., Disp: , Rfl:  .  pravastatin (PRAVACHOL) 40 MG tablet, Take 1 tablet (40 mg total) by mouth daily., Disp: 90 tablet, Rfl: 3 .  ramipril (ALTACE) 10 MG capsule, 1 tab once daily, Disp: 90 capsule, Rfl: 3   Allergies  Allergen Reactions  . Hydrocodone-Acetaminophen Nausea And Vomiting  . Propoxyphene Nausea Only   Review of Systems:   Pertinent items are noted in the HPI. Otherwise, ROS is negative.  Vitals:   Vitals:   01/07/17 1028  BP: 126/74  Pulse: 96  Temp: 97.9 F (36.6 C)  TempSrc: Oral  SpO2: 96%  Weight: 180 lb 9.6 oz  (81.9 kg)  Height: 5' 3.5" (1.613 m)     Body mass index is 31.49 kg/m.  Physical Exam:   Physical Exam  Constitutional: She appears well-nourished.  HENT:  Head: Normocephalic and atraumatic.  Eyes: EOM are normal. Pupils are equal, round, and reactive to light.  Neck: Normal range of motion. Neck supple.  Cardiovascular: Normal rate, regular rhythm, normal heart sounds and intact distal pulses.  Pulmonary/Chest: Effort normal.  Abdominal: Soft.  Skin: Skin is warm.     Psychiatric: She has a normal mood and affect. Her behavior is normal.  Nursing note and vitals reviewed.   Diabetic Foot Exam - Simple   Simple Foot Form Diabetic Foot exam was performed with the following findings:  Yes 01/07/2017 10:49 AM  Visual Inspection No deformities, no ulcerations, no other skin breakdown bilaterally:  Yes Sensation Testing Intact to touch and monofilament testing bilaterally:  Yes Pulse Check Posterior Tibialis and Dorsalis pulse intact bilaterally:  Yes Comments    Results for orders placed or performed in visit on 01/02/17  TSH  Result Value Ref Range   TSH 1.06 0.35 - 4.50 uIU/mL  T4, free  Result Value Ref Range   Free T4 1.54 0.60 - 1.60 ng/dL  POCT glycosylated hemoglobin (Hb A1C)  Result Value Ref Range   Hemoglobin A1C 7.9    Assessment  and Plan:   1. Tinea corporis - ketoconazole (NIZORAL) 2 % cream; Apply 1 application topically daily.  Dispense: 15 g; Refill: 0   . Reviewed expectations re: course of current medical issues. . Discussed self-management of symptoms. . Outlined signs and symptoms indicating need for more acute intervention. . Patient verbalized understanding and all questions were answered. Marland Kitchen Health Maintenance issues including appropriate healthy diet, exercise, and smoking avoidance were discussed with patient. . See orders for this visit as documented in the electronic medical record. . Patient received an After Visit Summary.  CMA  served as Education administrator during this visit. History, Physical, and Plan performed by medical provider. The above documentation has been reviewed and is accurate and complete. Briscoe Deutscher, D.O.   Briscoe Deutscher, DO [place], [place] [facility] 01/07/2017

## 2017-01-17 ENCOUNTER — Telehealth: Payer: Self-pay | Admitting: Internal Medicine

## 2017-01-17 NOTE — Telephone Encounter (Signed)
[employer] called about a medical necessity  paper that was faxed over to [place] for pt. Wanted to see if we have received.   Call if there are any questions.  [phone] Option 2

## 2017-01-17 NOTE — Telephone Encounter (Signed)
Have not received the fax from [employer], and unable to reach a representative when calling the number below

## 2017-01-18 ENCOUNTER — Encounter: Payer: 59 | Attending: Internal Medicine | Admitting: *Deleted

## 2017-01-18 DIAGNOSIS — Z4681 Encounter for fitting and adjustment of insulin pump: Secondary | ICD-10-CM | POA: Diagnosis not present

## 2017-01-18 DIAGNOSIS — E1065 Type 1 diabetes mellitus with hyperglycemia: Secondary | ICD-10-CM

## 2017-01-24 NOTE — Progress Notes (Signed)
Pump Upgrade Training:Date: 01/18/2017   Appt start time: 0930  End time: 1100.   Assessment:  Patient here for upgrade to 630G replacement within warranty insulin pump. [place] has [place]. She received her pump before she moved here and was trained there too.    Medications: see list.   Intervention:  Pump settings transferred directly from current pump to new pump by patient New meter ID added to pump for communicating Reviewed following features of pump with patient:  Temp Basal, Dual/Square Wave Bolus Patient is not signed up on [employer], I encouraged her to do so in order for MD office to assist if needed between visits.  Patient expressed understanding of info taught and is wearing pump by end of visit.  Follow Up  Patient to see MD for insulin dose adjustments  Patient informed of DM 1/Pump Support Group and expressed interest in attending meetings.

## 2017-01-31 DIAGNOSIS — E1065 Type 1 diabetes mellitus with hyperglycemia: Secondary | ICD-10-CM | POA: Diagnosis not present

## 2017-02-03 ENCOUNTER — Other Ambulatory Visit: Payer: Self-pay | Admitting: Family Medicine

## 2017-03-04 ENCOUNTER — Other Ambulatory Visit: Payer: Self-pay

## 2017-03-04 MED ORDER — GLUCOSE BLOOD VI STRP: ORAL_STRIP | 3 refills | Status: DC

## 2017-04-05 ENCOUNTER — Ambulatory Visit: Payer: 59 | Admitting: Internal Medicine

## 2017-04-05 ENCOUNTER — Encounter: Payer: Self-pay | Admitting: Internal Medicine

## 2017-04-05 VITALS — BP 120/74 | HR 98 | Ht 63.5 in | Wt 183.0 lb

## 2017-04-05 DIAGNOSIS — E039 Hypothyroidism, unspecified: Secondary | ICD-10-CM | POA: Diagnosis not present

## 2017-04-05 DIAGNOSIS — E041 Nontoxic single thyroid nodule: Secondary | ICD-10-CM

## 2017-04-05 DIAGNOSIS — E559 Vitamin D deficiency, unspecified: Secondary | ICD-10-CM | POA: Diagnosis not present

## 2017-04-05 DIAGNOSIS — E1065 Type 1 diabetes mellitus with hyperglycemia: Secondary | ICD-10-CM | POA: Diagnosis not present

## 2017-04-05 LAB — POCT GLYCOSYLATED HEMOGLOBIN (HGB A1C)

## 2017-04-05 NOTE — Patient Instructions (Addendum)
Please change: - basal rates: 12 AM: 0.775 units/h 5 AM: 0.9 9 AM: 0.775 - ICR: 1:10 - target: 100-120 - ISF: 50 >> 40 - Insulin on Board: 3h  Please continue Levothyroxine 125 mcg to am.  Take the thyroid hormone every day, with water, at least 30 minutes before breakfast, separated by at least 4 hours from: - acid reflux medications - calcium - iron - multivitamins  Please return in 3 months.

## 2017-04-05 NOTE — Progress Notes (Addendum)
Patient ID: Chelsea Mercado, female   DOB: 1960-11-03, 57 y.o.   MRN: [id number]   HPI: Chelsea Mercado is a 57 y.o.-year-old female, returning for f/u for DM1, dx'ed at 57 y/o, uncontrolled, without long term complications and hypothyroidism. She saw endocrinology before: Dr. Cherie Dark.  Last visit with me 57 months ago.  She was trying to reduce meat >> did not like it >> trying to get on a Mediterranean diet.  However, since last visit, she is mostly at home and eats because she is bored so she noticed that her sugars are higher in the second half of the day.  Last hemoglobin A1c was: Lab Results  Component Value Date   HGBA1C 7.9 01/02/2017   HGBA1C 7.4 07/30/2016  02/14/2016: HbA1c 7.6%  Pt is on an insulin pump:  -Medtronic 630 G (wears it on her arm) since 2016-2017 >> new pump since 01/2017, without CGM (not enough space on the abdomen), uses Humalog in the pump Pump settings: - basal rates: 12 AM: 0.775 units/h 5 AM: 0.9 9 AM: 0.775 - ICR: 1:12 >> 1:10 - target: 100-120 - ISF: 50 - Insulin on Board: 3h - bolus wizard: on TDD from basal insulin: 59% (18 units) >> 58% TDD from bolus insulin: 41% (12 units) >> 42% - extended bolusing: not using - changes infusion site: q 3-4 days  - Meter: Bayer Contour  Pt checks her sugars  ~3x a day: - am: 85-156, 179 >> 97-163, 186, 203 >> 124-186 - 2h after b'fast: n/c >> 208 >> 114 - before lunch: 85-168, 195, 251 >> 94-177 >> 97-146 - 2h after lunch: n/c  - before dinner: 93-145, 216 >> 103-190 >> 200-243 - 2h after dinner: n/c - bedtime: 123-193 >> 189-203 >> 242 - nighttime: 79 >> n/c Lowest sugar was 68 >> 94 >> 80 ; she has hypoglycemia awareness in the 60s.  She has previous hypoglycemia ED visit  x 1 many years ago.  she has a glucagon kit at home Highest sugar was 490 (site/infusion set pb) >> 203 >> 300 x2 (site pb, food indiscretions).  No previous DKA admissions  Pt's meals are: - Breakfast: waffles, or egg, bread, coffee - Lunch:  Sandwich, soup, salad, fruit, chips - Dinner: Meat, potatoes, brown rice, veggies or salad - Snacks: 1  -No CKD, last BUN/creatinine:  Lab Results  Component Value Date   BUN 15 10/01/2016   CREATININE 0.93 10/01/2016   On ramipril 10. -+ HL. Last set of lipids: Lab Results  Component Value Date   CHOL 187 10/01/2016   HDL 56.80 10/01/2016   LDLCALC 106 (H) 10/01/2016   TRIG 119.0 10/01/2016   CHOLHDL 3 10/01/2016   On pravastatin 40 - last eye exam was in  03/2016: No DR -Denies numbness and tingling in her feet.  Pt has no FH of DM.  She also has hypothyroidism: 57 y/o  Last TSH: Lab Results  Component Value Date   TSH 1.06 01/02/2017  02/10/2016: TSH 6.671 08/06/2015: TSH 4.35 03/26/2015: TSH 4.37 09/28/2014: TSH 2.73  Pt is on levothyroxine 125 mcg daily, taken: - in am since last visit - fasting - at least 30 min from b'fast - no Ca, Fe, MVI, PPIs - not on Biotin  Vitamin D deficiency:  Latest vitamin D level -normal: Lab Results  Component Value Date   VD25OH 47.47 10/01/2016  Prev: 02/10/2016: vit D 57 08/06/2015: vit D 21  On vitamin D 5000 units daily.  ROS: Constitutional: + weight gain/no  weight loss, no fatigue, no subjective hyperthermia, no subjective hypothermia Eyes: no blurry vision, no xerophthalmia ENT: no sore throat, no nodules palpated in throat, no dysphagia, no odynophagia, no hoarseness Cardiovascular: no CP/no SOB/no palpitations/no leg swelling Respiratory: no cough/no SOB/no wheezing Gastrointestinal: no N/no V/no D/no C/no acid reflux Musculoskeletal: no muscle aches/no joint aches Skin: no rashes, no hair loss Neurological: no tremors/no numbness/no tingling/no dizziness  I reviewed pt's medications, allergies, PMH, social hx, family hx, and changes were documented in the history of present illness. Otherwise, unchanged from my initial visit note.  Past Medical History:  Diagnosis Date  . Anemia   . Arthritis   . Controlled  type 1 diabetes mellitus without complication ([place]) 57/62/3762  . Hyperlipidemia 06/22/2013  . Obesity, Class I, BMI 30-34.9 06/22/2013  . Post menopausal syndrome 06/22/2013  . Primary hypothyroidism 06/22/2013  . Vitamin D deficiency 09/57/2015   Past Surgical History:  Procedure Laterality Date  . APPENDECTOMY    . [place]   Social History   Social History  . Marital status: Married    Spouse name: N/A  . Number of children: 3   Occupational History  . Homemaker    Social History Main Topics  . Smoking status: Never Smoker  . Smokeless tobacco: Never Used  . Alcohol use No  . Drug use: No   Current Outpatient Medications on File Prior to Visit  Medication Sig Dispense Refill  . Cholecalciferol (VITAMIN D3) 5000 units CAPS Take by mouth.    Marland Kitchen glucagon (GLUCAGON EMERGENCY) 1 MG injection Inject 1 mg into the muscle once as needed. 1 each 12  . glucose blood (CONTOUR NEXT TEST) test strip TEST 6-7 TIMES PER DAY 300 each 3  . Insulin Glargine (LANTUS SOLOSTAR) 100 UNIT/ML Solostar Pen Inject 20 Units into the skin daily at 10 pm. 5 pen 11  . insulin lispro (HUMALOG) 100 UNIT/ML injection PER PUMP TAKE MAXIMUM 50 UNITS DAILY 10 mL 5  . Insulin Pen Needle (PEN NEEDLES) 32G X 4 MM MISC 1 each by Does not apply route daily. 100 each 11  . ketoconazole (NIZORAL) 2 % cream Apply 1 application topically daily. 15 g 0  . Ketoprofen 10 % CREA by Does not apply route.    Marland Kitchen levothyroxine (SYNTHROID, LEVOTHROID) 125 MCG tablet Take 1 tab by mouth once daily 90 tablet 3  . naproxen (NAPROSYN) 500 MG tablet Take by mouth.    . pravastatin (PRAVACHOL) 40 MG tablet Take 1 tablet (40 mg total) by mouth daily. 90 tablet 3  . ramipril (ALTACE) 10 MG capsule 1 tab once daily 90 capsule 3   No current facility-administered medications on file prior to visit.    Allergies  Allergen Reactions  . Hydrocodone-Acetaminophen Nausea And Vomiting  . Propoxyphene Nausea Only    Family History  Problem Relation Age of Onset  . Heart disease Mother   . Hypertension Mother   . High Cholesterol Mother   . Heart disease Maternal Grandfather    PE: BP 120/74   Pulse 98   Ht 5' 3.5" (1.613 m)   Wt 183 lb (83 kg)   SpO2 98%   BMI 31.91 kg/m  Wt Readings from Last 3 Encounters:  04/05/17 183 lb (83 kg)  01/07/17 180 lb 9.6 oz (81.9 kg)  01/02/17 181 lb 6.4 oz (82.3 kg)   Constitutional: overweight, in NAD Eyes: PERRLA, EOMI, no exophthalmos ENT: moist mucous membranes, no thyromegaly, + left lobe and  possible isthmic thyroid nodules; no cervical lymphadenopathy Cardiovascular: tachycardia, RR, No MRG Respiratory: CTA B Gastrointestinal: abdomen soft, NT, ND, BS+ Musculoskeletal: no deformities, strength intact in all 4 Skin: moist, warm, no rashes Neurological: no tremor with outstretched hands, DTR normal in all 4  ASSESSMENT: 1. DM1, uncontrolled, without long term complications, but with hyperglycemia  2. Hypothyroidism  3. Thyroid nodule  4. Vit D def  PLAN:  1. Patient with long-standing, uncontrolled, type 1 diabetes, on insulin pump therapy.  She is usually doing a good job entering her carbs in her pump and bolusing for meals, however, since last visit, she relaxed her diet and she noticed that her sugars are higher towards the second half of the day.  At last visit, her HbA1c was 7.9% and at this visit, it is 8.2%, higher. -She mentions that she believes that her settings are right, but she does not have her eating under control.  She is trying to start a Mediterranean diet, which I encouraged her to do.  She tried to the vegan diet but could not do it. -At this visit, we discussed about decreasing her insulin sensitivity factor from 50 to 40 because she does not feel her pump is giving her enough insulin when she corrects a high sugar - she is changing her infusion site every 3 days. -We will not change her basal rates for now despite the  higher blood sugars since she is usually taking a higher daily dose of insulin from basal rates then from boluses - I advised her to:  Patient Instructions  Please change: - basal rates: 12 AM: 0.775 units/h 5 AM: 0.9 9 AM: 0.775 - ICR: 1:10 - target: 100-120 - ISF: 50 >> 40 - Insulin on Board: 3h  Please continue Levothyroxine 125 mcg to am.  Take the thyroid hormone every day, with water, at least 30 minutes before breakfast, separated by at least 4 hours from: - acid reflux medications - calcium - iron - multivitamins  Please return in 3 months.   - continue checking sugars at different times of the day - check  at least 4 times a day x a day, rotating checks - advised for yearly eye exams >> she is UTD - Return to clinic in 3 mo with sugar log    2.  hypothyroidism  - latest thyroid labs reviewed with pt >> normal at last visit - she continues on LT4 125 mcg daily - pt feels good on this dose. - we discussed about taking the thyroid hormone every day, with water, >30 minutes before breakfast, separated by >4 hours from acid reflux medications, calcium, iron, multivitamins. Pt. is taking it correctly.  3. Thyroid nodule - She has a possible left-sided thyroid nodule versus left lobe enlargement and also possible isthmic smaller nodule -  no neck compression symptoms - We will check a thyroid ultrasound now  4.Vit D def -Continues 5000 units vitamin D daily -Latest level reviewed: Normal 6 months ago  - time spent with the patient: 40 min, of which >50% was spent in reviewing her pump interest, settings, boluses (we could not download the reports today because her clip would not come off, but I reviewed the valve using her pump manually), discussing her  hyper-glycemic episodes, reviewing previous labs and pump settings and developing a plan to avoid hypo- and hyper-glycemia.  We also discussed about her other endocrine problems (please see above).  Thyroid U/S  (04/16/2017):: No thyroid nodule  Narrative  CLINICAL DATA: 57 year old female with a history of palpable nodules  EXAM: THYROID ULTRASOUND  TECHNIQUE: Ultrasound examination of the thyroid gland and adjacent soft tissues was performed.  COMPARISON: None.  FINDINGS: Parenchymal Echotexture: Moderately heterogenous Isthmus: 0.4 cm Right lobe: 4.7 cm x 1.1 cm x 1.0 cm Left lobe: 4.0 cm x 1.2 cm x 0.8 cm  _________________________________________________________  Estimated total number of nodules >/= 1 cm: 0 Number of spongiform nodules >/= 2 cm not described below (TR1): 0 Number of mixed cystic and solid nodules >/= 1.5 cm not described below (TR2): 0  _________________________________________________________  No discrete nodules are seen within the thyroid gland.  IMPRESSION: Heterogeneous thyroid suggesting medical thyroid disease.  Electronically Signed By: Corrie Mckusick D.O. On: 04/16/2017 14:42     [name], MD PhD [facility] Endocrinology

## 2017-04-16 ENCOUNTER — Ambulatory Visit
Admission: RE | Admit: 2017-04-16 | Discharge: 2017-04-16 | Disposition: A | Discharge status: Home / Self Care | Payer: 59 | Source: Ambulatory Visit | Attending: Internal Medicine | Admitting: Internal Medicine

## 2017-04-16 DIAGNOSIS — E042 Nontoxic multinodular goiter: Secondary | ICD-10-CM | POA: Diagnosis not present

## 2017-04-29 NOTE — Progress Notes (Signed)
Chelsea Mercado is a 57 y.o. female is here for follow up.  History of Present Illness:   SWF:UXNATFT in today for follow up.   Right Knee pain: Started when she was a child but now ache is constant. She has been taking naproxen that helps a little but not much. She has iced and heat a few times but didn't get much relief from that as well.    Foot Pain: Would like referral to podiatry for bilateral foot pain. When she [place] up on her feet a lot she has some swelling and pain. She has seen sports medicine but didn't is still having pain.   Health Maintenance Due  Topic Date Due  . OPHTHALMOLOGY EXAM  04/23/2017   Depression screen PHQ 2/9 10/30/2016  Decreased Interest 0  Down, Depressed, Hopeless 0  PHQ - 2 Score 0   PMHx, SurgHx, SocialHx, FamHx, Medications, and Allergies were reviewed in the Visit Navigator and updated as appropriate.   Patient Active Problem List   Diagnosis Date Noted  . Achilles tendon contracture, bilateral 05/07/2017  . Left thyroid nodule 10/01/2016  . Varicose veins of bilateral lower extremities with other complications 73/22/0254  . Spider veins of both lower extremities 08/20/2016  . Varicose vein of leg 07/30/2016  . Vitamin D deficiency 09/24/2013  . Type 1 diabetes mellitus with hyperglycemia ([place]) 06/22/2013  . Hyperlipidemia 06/22/2013  . Obesity, Class I, BMI 30-34.9 06/22/2013  . Primary hypothyroidism 06/22/2013   Social History   Tobacco Use  . Smoking status: Never Smoker  . Smokeless tobacco: Never Used  Substance Use Topics  . Alcohol use: No  . Drug use: No   Current Medications and Allergies:   .  Cholecalciferol (VITAMIN D3) 5000 units CAPS, Take by mouth., Disp: , Rfl:  .  glucagon (GLUCAGON EMERGENCY) 1 MG injection, Inject 1 mg into the muscle once as needed., Disp: 1 each, Rfl: 12 .  glucose blood (CONTOUR NEXT TEST) test strip, TEST 6-7 TIMES PER DAY, Disp: 300 each, Rfl: 3 .  Insulin Glargine (LANTUS SOLOSTAR) 100  UNIT/ML Solostar Pen, Inject 20 Units into the skin daily at 10 pm., Disp: 5 pen, Rfl: 11 .  insulin lispro (HUMALOG) 100 UNIT/ML injection, PER PUMP TAKE MAXIMUM 50 UNITS DAILY, Disp: 10 mL, Rfl: 5 .  Insulin Pen Needle (PEN NEEDLES) 32G X 4 MM MISC, 1 each by Does not apply route daily., Disp: 100 each, Rfl: 11 .  ketoconazole (NIZORAL) 2 % cream, Apply 1 application topically daily., Disp: 15 g, Rfl: 0 .  Ketoprofen 10 % CREA, by Does not apply route., Disp: , Rfl:  .  levothyroxine (SYNTHROID, LEVOTHROID) 125 MCG tablet, Take 1 tab by mouth once daily, Disp: 90 tablet, Rfl: 3 .  naproxen (NAPROSYN) 500 MG tablet, Take by mouth., Disp: , Rfl:  .  pravastatin (PRAVACHOL) 40 MG tablet, Take 1 tablet (40 mg total) by mouth daily., Disp: 90 tablet, Rfl: 3 .  ramipril (ALTACE) 10 MG capsule, 1 tab once daily, Disp: 90 capsule, Rfl: 3  Allergies  Allergen Reactions  . Hydrocodone-Acetaminophen Nausea And Vomiting  . Propoxyphene Nausea Only   Review of Systems   Pertinent items are noted in the HPI. Otherwise, ROS is negative.  Vitals:   Vitals:   04/30/17 0945  BP: 124/78  Pulse: 93  Temp: 98.3 F (36.8 C)  TempSrc: Oral  SpO2: 93%  Weight: 185 lb 6.4 oz (84.1 kg)  Height: 5' 3.5" (1.613 m)  Body mass index is 32.33 kg/m.   Physical Exam:   Physical Exam  Constitutional: She appears well-nourished.  HENT:  Head: Normocephalic and atraumatic.  Eyes: Pupils are equal, round, and reactive to light. EOM are normal.  Neck: Normal range of motion. Neck supple.  Cardiovascular: Normal rate, regular rhythm, normal heart sounds and intact distal pulses.  Pulmonary/Chest: Effort normal.  Abdominal: Soft.  Musculoskeletal:       Right knee: She exhibits decreased range of motion. She exhibits no effusion. Tenderness found. Medial joint line and lateral joint line tenderness noted.  Skin: Skin is warm.  Psychiatric: She has a normal mood and affect. Her behavior is normal.    Nursing note and vitals reviewed.   CLINICAL DATA:  Chronic right knee pain, no recent injury  EXAM: RIGHT KNEE 3 VIEWS  COMPARISON:  None.  FINDINGS: Views of the right knee show very mild primarily unicompartmental degenerative joint disease the right knee with some loss of medial joint compartment. However no significant degenerative spurring is seen. No fracture is noted and there is no evidence of joint effusion. The standing view of the left knee in the frontal projection shows no significant abnormality.  IMPRESSION: Very mild degenerative joint disease of the right knee involving the medial compartment. No significant degenerative spurring is seen.  Assessment and Plan:   [name] was seen today for follow-up.  Diagnoses and all orders for this visit:  Encounter for screening for HIV -     HIV antibody  Need for hepatitis C screening test -     Hepatitis C antibody  Chronic pain of right knee -     Ambulatory referral to Orthopedic Surgery -     DG Knee AP/LAT W/[place] Right; Future  Pain in both feet -     Ambulatory referral to Orthopedic Surgery  Pain in both lower extremities -     Comprehensive metabolic panel -     CK  . Reviewed expectations re: course of current medical issues. . Discussed self-management of symptoms. . Outlined signs and symptoms indicating need for more acute intervention. . Patient verbalized understanding and all questions were answered. Marland Kitchen Health Maintenance issues including appropriate healthy diet, exercise, and smoking avoidance were discussed with patient. . See orders for this visit as documented in the electronic medical record. . Patient received an After Visit Summary.  Briscoe Deutscher, DO [place], [place] 05/07/2017  Future Appointments  Date Time Provider [place]  08/06/2017 11:00 AM [name], MD [facility] None

## 2017-04-30 ENCOUNTER — Ambulatory Visit: Payer: 59 | Admitting: Family Medicine

## 2017-04-30 ENCOUNTER — Ambulatory Visit (INDEPENDENT_AMBULATORY_CARE_PROVIDER_SITE_OTHER): Payer: 59

## 2017-04-30 VITALS — BP 124/78 | HR 93 | Temp 98.3°F | Ht 63.5 in | Wt 185.4 lb

## 2017-04-30 DIAGNOSIS — M1711 Unilateral primary osteoarthritis, right knee: Secondary | ICD-10-CM | POA: Diagnosis not present

## 2017-04-30 DIAGNOSIS — M79671 Pain in right foot: Secondary | ICD-10-CM | POA: Diagnosis not present

## 2017-04-30 DIAGNOSIS — G8929 Other chronic pain: Secondary | ICD-10-CM

## 2017-04-30 DIAGNOSIS — M79672 Pain in left foot: Secondary | ICD-10-CM | POA: Diagnosis not present

## 2017-04-30 DIAGNOSIS — M79604 Pain in right leg: Secondary | ICD-10-CM

## 2017-04-30 DIAGNOSIS — M25561 Pain in right knee: Secondary | ICD-10-CM

## 2017-04-30 DIAGNOSIS — Z1159 Encounter for screening for other viral diseases: Secondary | ICD-10-CM | POA: Diagnosis not present

## 2017-04-30 DIAGNOSIS — E1065 Type 1 diabetes mellitus with hyperglycemia: Secondary | ICD-10-CM | POA: Diagnosis not present

## 2017-04-30 DIAGNOSIS — Z114 Encounter for screening for human immunodeficiency virus [HIV]: Secondary | ICD-10-CM | POA: Diagnosis not present

## 2017-04-30 DIAGNOSIS — M79605 Pain in left leg: Secondary | ICD-10-CM | POA: Diagnosis not present

## 2017-04-30 LAB — COMPREHENSIVE METABOLIC PANEL
ALT: 14 U/L (ref 0–35)
AST: 20 U/L (ref 0–37)
Albumin: 3.9 g/dL (ref 3.5–5.2)
Alkaline Phosphatase: 67 U/L (ref 39–117)
BUN: 20 mg/dL (ref 6–23)
CO2: 31 mEq/L (ref 19–32)
Calcium: 9.4 mg/dL (ref 8.4–10.5)
Chloride: 100 mEq/L (ref 96–112)
Creatinine, Ser: 0.87 mg/dL (ref 0.40–1.20)
GFR: 71.39 mL/min (ref >60.00)
Glucose, Bld: 200 mg/dL — ABNORMAL HIGH (ref 70–99)
Potassium: 4.4 mEq/L (ref 3.5–5.1)
Sodium: 135 mEq/L (ref 135–145)
Total Bilirubin: 0.5 mg/dL (ref 0.2–1.2)
Total Protein: 6.9 g/dL (ref 6.0–8.3)

## 2017-04-30 LAB — CK: Total CK: 116 U/L (ref 7–177)

## 2017-05-01 LAB — HIV ANTIBODY (ROUTINE TESTING W REFLEX): HIV 1&2 Ab, 4th Generation: NONREACTIVE

## 2017-05-01 LAB — HEPATITIS C ANTIBODY
Hepatitis C Ab: NONREACTIVE
SIGNAL TO CUT-OFF: 0.05 (ref <1.00)

## 2017-05-07 ENCOUNTER — Ambulatory Visit (INDEPENDENT_AMBULATORY_CARE_PROVIDER_SITE_OTHER): Payer: 59

## 2017-05-07 ENCOUNTER — Encounter (INDEPENDENT_AMBULATORY_CARE_PROVIDER_SITE_OTHER): Payer: Self-pay | Admitting: Orthopedic Surgery

## 2017-05-07 ENCOUNTER — Encounter: Payer: Self-pay | Admitting: Family Medicine

## 2017-05-07 ENCOUNTER — Ambulatory Visit (INDEPENDENT_AMBULATORY_CARE_PROVIDER_SITE_OTHER): Payer: 59 | Admitting: Orthopedic Surgery

## 2017-05-07 VITALS — Ht 63.0 in | Wt 185.0 lb

## 2017-05-07 DIAGNOSIS — M6702 Short Achilles tendon (acquired), left ankle: Secondary | ICD-10-CM | POA: Diagnosis not present

## 2017-05-07 DIAGNOSIS — M170 Bilateral primary osteoarthritis of knee: Secondary | ICD-10-CM

## 2017-05-07 DIAGNOSIS — M6701 Short Achilles tendon (acquired), right ankle: Secondary | ICD-10-CM

## 2017-05-07 DIAGNOSIS — M79672 Pain in left foot: Secondary | ICD-10-CM

## 2017-05-07 DIAGNOSIS — M79671 Pain in right foot: Secondary | ICD-10-CM | POA: Diagnosis not present

## 2017-05-07 NOTE — Progress Notes (Signed)
Office Visit Note   Patient: Chelsea Mercado           Date of Birth: 1960/10/02           MRN: [id number] Visit Date: 05/07/2017              Requested by: Briscoe Deutscher, [address], [place] [address] PCP: Briscoe Deutscher, DO  Chief Complaint  Patient presents with  . Right Knee - Pain  . Left Foot - Pain  . Right Foot - Pain      HPI: Patient is a 57 year old woman with diabetes insulin controlled with a pump who has had chronic history of midfoot pain bilaterally pain worse at the end of the day after prolonged ambulation also arthritic pain in both knees worse in the right than the left she states she is been diagnosed with mild arthritis of the knees.  Assessment & Plan: Visit Diagnoses:  1. Pain in right foot   2. Pain in left foot   3. Achilles tendon contracture, bilateral   4. Bilateral primary osteoarthritis of knee     Plan: Patient was given instructions and demonstrated Achilles stretching with both feet to be done 5 times a day a minute at a time.  Recommended a stiff soled Trail running shoe with sole orthotics to unload the arch.  Recommended exercise and strength training for the arthritis of her knees.  Follow-Up Instructions: Return if symptoms worsen or fail to improve.   Ortho Exam  Patient is alert, oriented, no adenopathy, well-dressed, normal affect, normal respiratory effort. Examination patient has a normal gait she has good pulses there is mild crepitation range of motion of her knees.  Examination of both feet she has heel cord contracture with dorsiflexion just short of neutral with her knee extended bilaterally.  She has no nodular changes to the Achilles there is no tenderness to palpation across the Lisfranc complex.  She does have some prominent bony spurs base of the first metatarsal bilaterally.  Imaging: Xr Foot 2 Views Left  Result Date: 05/07/2017 2 view radiographs of the right foot shows a congruent joint spaces she does  have osteophytic bone spurs from the cuneiform navicular and the medial cuneiform base of the first metatarsal.  Xr Foot 2 Views Right  Result Date: 05/07/2017 2 view radiographs of the left foot shows arthritic bone spurs  at the base of the first metatarsal medial cuneiform the joint spaces are congruent no widening of the Lisfranc complex.  No images are attached to the encounter.  Labs: Lab Results  Component Value Date   HGBA1C 8.2% 04/05/2017   HGBA1C 7.9 01/02/2017   HGBA1C 7.4 07/30/2016    @LABALLVALUES (HGBA1:3,ALB:3,PREALB:3)@  Body mass index is 32.77 kg/m.  Orders:  Orders Placed This Encounter  Procedures  . XR Foot 2 Views Right  . XR Foot 2 Views Left   No orders of the defined types were placed in this encounter.    Procedures: No procedures performed  Clinical Data: No additional findings.  ROS:  All other systems negative, except as noted in the HPI. Review of Systems  Objective: Vital Signs: Ht 5\' 3"  (1.6 m)   Wt 185 lb (83.9 kg)   BMI 32.77 kg/m   Specialty Comments:  No specialty comments available.  PMFS History: Patient Active Problem List   Diagnosis Date Noted  . Achilles tendon contracture, bilateral 05/07/2017  . Left thyroid nodule 10/01/2016  . Varicose veins of bilateral lower  extremities with other complications 38/17/7116  . Spider veins of both lower extremities 08/20/2016  . Varicose vein of leg 07/30/2016  . Vitamin D deficiency 09/24/2013  . Type 1 diabetes mellitus with hyperglycemia ([place]) 06/22/2013  . Hyperlipidemia 06/22/2013  . Obesity, Class I, BMI 30-34.9 06/22/2013  . Primary hypothyroidism 06/22/2013   Past Medical History:  Diagnosis Date  . Anemia   . Arthritis   . Controlled type 1 diabetes mellitus without complication ([place]) 57/90/3833  . Hyperlipidemia 06/22/2013  . Obesity, Class I, BMI 30-34.9 06/22/2013  . Post menopausal syndrome 06/22/2013  . Primary hypothyroidism 06/22/2013  . Vitamin D  deficiency 09/24/2013    Family History  Problem Relation Age of Onset  . Heart disease Mother   . Hypertension Mother   . High Cholesterol Mother   . Heart disease Maternal Grandfather     Past Surgical History:  Procedure Laterality Date  . APPENDECTOMY    . [place]   Social History   Occupational History  . Not on file  Tobacco Use  . Smoking status: Never Smoker  . Smokeless tobacco: Never Used  Substance and Sexual Activity  . Alcohol use: No  . Drug use: No  . Sexual activity: Yes    Partners: Male

## 2017-05-24 DIAGNOSIS — E1065 Type 1 diabetes mellitus with hyperglycemia: Secondary | ICD-10-CM | POA: Diagnosis not present

## 2017-05-27 DIAGNOSIS — E119 Type 2 diabetes mellitus without complications: Secondary | ICD-10-CM | POA: Diagnosis not present

## 2017-05-27 LAB — HM DIABETES EYE EXAM

## 2017-06-25 ENCOUNTER — Other Ambulatory Visit: Payer: Self-pay | Admitting: Family Medicine

## 2017-07-31 ENCOUNTER — Telehealth: Payer: Self-pay | Admitting: Emergency Medicine

## 2017-07-31 ENCOUNTER — Other Ambulatory Visit: Payer: Self-pay | Admitting: Family Medicine

## 2017-07-31 NOTE — Telephone Encounter (Signed)
Pt called and wants to know if she can get a refill on her HUMALOG 100 UNIT/ML injection. Pharmacy is [facility] [place]. Thanks.

## 2017-08-01 MED ORDER — INSULIN LISPRO 100 UNIT/ML ~~LOC~~ SOLN: SUBCUTANEOUS | 1 refills | Status: DC

## 2017-08-01 NOTE — Telephone Encounter (Signed)
Sent!

## 2017-08-06 ENCOUNTER — Encounter: Payer: Self-pay | Admitting: Internal Medicine

## 2017-08-06 ENCOUNTER — Ambulatory Visit: Payer: 59 | Admitting: Internal Medicine

## 2017-08-06 VITALS — BP 130/72 | HR 85 | Ht 63.5 in | Wt 184.6 lb

## 2017-08-06 DIAGNOSIS — E039 Hypothyroidism, unspecified: Secondary | ICD-10-CM | POA: Diagnosis not present

## 2017-08-06 DIAGNOSIS — E559 Vitamin D deficiency, unspecified: Secondary | ICD-10-CM | POA: Diagnosis not present

## 2017-08-06 DIAGNOSIS — E1065 Type 1 diabetes mellitus with hyperglycemia: Secondary | ICD-10-CM | POA: Diagnosis not present

## 2017-08-06 DIAGNOSIS — E041 Nontoxic single thyroid nodule: Secondary | ICD-10-CM

## 2017-08-06 LAB — POCT GLYCOSYLATED HEMOGLOBIN (HGB A1C): HEMOGLOBIN A1C: 7.9 % — AB (ref 4.0–5.6)

## 2017-08-06 NOTE — Addendum Note (Signed)
Addended by: Drucilla Schmidt on: 08/06/2017 02:09 PM   Modules accepted: Orders

## 2017-08-06 NOTE — Progress Notes (Signed)
Patient ID: Chelsea Mercado, female   DOB: [birth year], 57 y.o.   MRN: [id number]   HPI: Chelsea Mercado is a 58 y.o.-year-old female, returning for f/u for DM1, dx'ed at 57 y/o, uncontrolled, without long term complications and hypothyroidism. She saw endocrinology before: Dr. Cherie Dark.  Last visit with me 4 months ago.  Last hemoglobin A1c was: Lab Results  Component Value Date   HGBA1C 8.2% 04/05/2017   HGBA1C 7.9 01/02/2017   HGBA1C 7.4 07/30/2016  02/14/2016: HbA1c 7.6%  She is on insulin pump:  -Medtronic 630 G (wears it on her arm) since 2016-17 >> new pump since 01/2017, without CGM (not enough space on the abdomen).  Uses Humalog in the pump.  Pump settings: - basal rates: 12 AM: 0.775 units/h 5 AM: 0.9 9 AM: 0.775 - ICR: 1:10 - target: 100-120 - ISF: 50 >> 40 - Insulin on Board: 3h - bolus wizard: on TDD from basal insulin: 59% (18 units) >> 58% TDD from bolus insulin: 41% (12 units) >> 42% - extended bolusing: not using - changes infusion site: q 3 days  - Meter: Bayer Contour  Pt checks her sugars 3x a day: - am:  97-163, 186, 203 >> 124-186 >> 66-229 - 2h after b'fast: n/c >> 208 >> 114 >> 67, 110-194, 259 - before lunch: 94-177 >> 97-146 >> 81-242, 285 - 2h after lunch: n/c  >> 219-253 - before dinner: 103-190 >> 200-243 >> 102-210, 274 - 2h after dinner: n/c >> 70-195 - bedtime: 123-193 >> 189-203 >> 242 >> see above - nighttime: 79 >> n/c >> 217 Lowest sugar was 68 >> 94 >> 80 >> 66 ; she has hypoglycemia awareness  in the 60s.  She had one previous ED visit for hypoglycemia many years ago .  she she has a glucagon kit at home. Highest sugar was 300 x2 (site pb, food indiscretions) >> 285.  No previous DKA admissions.    Pt's meals are: - Breakfast: waffles, or egg, bread, coffee - Lunch: Sandwich, soup, salad, fruit, chips - Dinner: Meat, potatoes, brown rice, veggies or salad - Snacks: 1  - no CKD, last BUN/creatinine:  Lab Results  Component Value Date   BUN  20 04/30/2017   BUN 15 10/01/2016   CREATININE 0.87 04/30/2017   CREATININE 0.93 10/01/2016   On  ramipril 10. -+ HL. Last set of lipids: Lab Results  Component Value Date   CHOL 187 10/01/2016   HDL 56.80 10/01/2016   LDLCALC 106 (H) 10/01/2016   TRIG 119.0 10/01/2016   CHOLHDL 3 10/01/2016   On p pravastatin 40 - last eye exam was in 05/2017: No DR - no numbness and tingling in her feet.  Pt has no FH of DM.  Hypothyroidism:  Last TSH normal: Lab Results  Component Value Date   TSH 1.06 01/02/2017  02/10/2016: TSH 6.671 08/06/2015: TSH 4.35 03/26/2015: TSH 4.37 09/28/2014: TSH 2.73  Pt is on levothyroxine 125 mcg daily, taken: - in am - fasting - at least 30 min from b'fast - no Ca, Fe, MVI, PPIs - not on Biotin  Vitamin D deficiency:  Latest vitamin D level -normal Lab Results  Component Value Date   VD25OH 47.47 10/01/2016  Prev: 02/10/2016: vit D 31 08/06/2015: vit D 21  On vitamin D 5000 units daily:  ROS: Constitutional: no weight gain/no weight loss, no fatigue, no subjective hyperthermia, no subjective hypothermia Eyes: no blurry vision, no xerophthalmia ENT: no sore throat, no nodules palpated in throat,  no dysphagia, no odynophagia, no hoarseness Cardiovascular: no CP/no SOB/no palpitations/no leg swelling Respiratory: no cough/no SOB/no wheezing Gastrointestinal: no N/no V/no D/no C/no acid reflux Musculoskeletal: no muscle aches/no joint aches Skin: no rashes, no hair loss Neurological: no tremors/no numbness/no tingling/no dizziness  I reviewed pt's medications, allergies, PMH, social hx, family hx, and changes were documented in the history of present illness. Otherwise, unchanged from my initial visit note.  Past Medical History:  Diagnosis Date  . Anemia   . Arthritis   . Controlled type 1 diabetes mellitus without complication ([place]) 57/84/6962  . Hyperlipidemia 06/22/2013  . Obesity, Class I, BMI 30-34.9 06/22/2013  . Post  menopausal syndrome 06/22/2013  . Primary hypothyroidism 06/22/2013  . Vitamin D deficiency 09/24/2013   Past Surgical History:  Procedure Laterality Date  . APPENDECTOMY    . [place]   Social History   Social History  . Marital status: Married    Spouse name: N/A  . Number of children: 3   Occupational History  . Homemaker    Social History Main Topics  . Smoking status: Never Smoker  . Smokeless tobacco: Never Used  . Alcohol use No  . Drug use: No   Current Outpatient Medications on File Prior to Visit  Medication Sig Dispense Refill  . Cholecalciferol (VITAMIN D3) 5000 units CAPS Take by mouth.    Marland Kitchen glucagon (GLUCAGON EMERGENCY) 1 MG injection Inject 1 mg into the muscle once as needed. 1 each 12  . glucose blood (CONTOUR NEXT TEST) test strip TEST 6-7 TIMES PER DAY 300 each 3  . Insulin Glargine (LANTUS SOLOSTAR) 100 UNIT/ML Solostar Pen Inject 20 Units into the skin daily at 10 pm. (Patient taking differently: Inject 20 Units into the skin daily at 10 pm. ) 5 pen 11  . insulin lispro (HUMALOG) 100 UNIT/ML injection PER PUMP TAKE MAXIMUM 50 UNITS DAILY 10 mL 5  . insulin lispro (HUMALOG) 100 UNIT/ML injection PER PUMP TAKE MAXIMUM 50 UNITS DAILY 50 mL 1  . Insulin Pen Needle (PEN NEEDLES) 32G X 4 MM MISC 1 each by Does not apply route daily. 100 each 11  . Ketoprofen 10 % CREA by Does not apply route.    Marland Kitchen levothyroxine (SYNTHROID, LEVOTHROID) 125 MCG tablet Take 1 tab by mouth once daily 90 tablet 3  . naproxen (NAPROSYN) 500 MG tablet Take by mouth.    . pravastatin (PRAVACHOL) 40 MG tablet Take 1 tablet (40 mg total) by mouth daily. 90 tablet 3  . ramipril (ALTACE) 10 MG capsule 1 tab once daily 90 capsule 3   No current facility-administered medications on file prior to visit.    Allergies  Allergen Reactions  . Hydrocodone-Acetaminophen Nausea And Vomiting  . Propoxyphene Nausea Only   Family History  Problem Relation Age of Onset  . Heart  disease Mother   . Hypertension Mother   . High Cholesterol Mother   . Heart disease Maternal Grandfather    PE: BP 130/72   Pulse 85   Ht 5' 3.5" (1.613 m)   Wt 184 lb 9.6 oz (83.7 kg)   SpO2 95%   BMI 32.19 kg/m  Wt Readings from Last 3 Encounters:  08/06/17 184 lb 9.6 oz (83.7 kg)  05/07/17 185 lb (83.9 kg)  04/30/17 185 lb 6.4 oz (84.1 kg)   Constitutional: overweight, in NAD Eyes: PERRLA, EOMI, no exophthalmos ENT: moist mucous membranes, no thyromegaly, no cervical lymphadenopathy Cardiovascular: RRR, No MRG Respiratory: CTA B  Gastrointestinal: abdomen soft, NT, ND, BS+ Musculoskeletal: no deformities, strength intact in all 4 Skin: moist, warm, no rashes Neurological: no tremor with outstretched hands, DTR normal in all 4  ASSESSMENT: 1. DM1, uncontrolled, without long term complications, but with hyperglycemia  2. Hypothyroidism  3. Thyroid nodule  4. Vit D def  PLAN:  1. Patient with  long-standing, uncontrolled, type 1 diabetes, on insulin pump.  She is usually doing a good job entering her carbs in the pump and bolusing for meals, however, before last visit, she relaxed her diet and noticed that her sugars were higher towards the second half of the day.  And HbA1c was also higher, at 8.2%.  She started to adjust her diet and trying a Mediterranean diet right before last visit.  However, she did not continue with this since last visit. she tried a vegan diet but could not do it.  She also started to eat beyond 7 PM, which she feels is influencing her morning sugars.  They are usually higher.  Also, her sugars in the afternoon and before dinner are higher, as she sometimes eats ice cream or other sweets after lunch and does not enter the carbs for them, only does a manual bolus.  I strongly advised her to enter the carbs for these if she does a later bolus (not associated with the main meal) - At last visit, as she felt that she was not getting enough insulin when she  was trying to do a correction, we decrease her insulin sensitivity factor at last visit.  We will continue the current correction factor for now - She is telling me that if she tries to eat a lighter meal, even though it contains carbs, she may drop her sugars after the meal.  I advised her that if she restarts the Mediterranean diet, she may need to increase her insulin to carb ratio from 1:10 to 1:12 to avoid low blood sugars after meals - She continues to do a good job changing her infusion sites every 3 days -No other changes are necessary for now. - I advised her to:  Patient Instructions  Please continue: - basal rates: 12 AM: 0.775 units/h 5 AM: 0.9 9 AM: 0.775  - ICR: 1:10 (try 1:12 on the Mediterranean diet) - target: 100-120 - ISF: 40 - Insulin on Board: 3h  Enter the carbs for snacks/desserts.  Please continue Levothyroxine 125 mcg to am.  Take the thyroid hormone every day, with water, at least 30 minutes before breakfast, separated by at least 4 hours from: - acid reflux medications - calcium - iron - multivitamins  Please return in 4 months.   - today, HbA1c is 7.9% (lower) - continue checking sugars at different times of the day - check 4x a day, rotating checks - advised for yearly eye exams >> she is UTD - Return to clinic in 4 mo      2.  hypothyroidism  - latest thyroid labs reviewed with pt >> normal  - she continues on LT4 125 mcg daily - pt feels good on this dose. - we discussed about taking the thyroid hormone every day, with water, >30 minutes before breakfast, separated by >4 hours from acid reflux medications, calcium, iron, multivitamins. Pt. is taking it correctly. - will check thyroid tests at next visit  3. H/o Thyroid nodule -  no neck compression symptoms - Reviewed the report of her latest ultrasound: No thyroid nodules evident  4.Vit D def - Continues  5000 units vitamin D daily - Latest level was normal - will check again at next  visit  - time spent with the patient: 30 min, of which >50% was spent in reviewing her pump downloads, discussing her hypo- and hyper-glycemic episodes, reviewing previous labs and pump settings and developing a plan to avoid hypo- and hyper-glycemia.    [name], MD PhD [facility] Endocrinology

## 2017-08-06 NOTE — Patient Instructions (Addendum)
Please continue: - basal rates: 12 AM: 0.775 units/h 5 AM: 0.9 9 AM: 0.775  - ICR: 1:10 (try 1:12 on the Mediterranean) - target: 100-120 - ISF: 40 - Insulin on Board: 3h  Enter the carbs for snacks/desserts.  Please continue Levothyroxine 125 mcg to am.  Take the thyroid hormone every day, with water, at least 30 minutes before breakfast, separated by at least 4 hours from: - acid reflux medications - calcium - iron - multivitamins  Please return in 4 months.

## 2017-08-22 DIAGNOSIS — E1065 Type 1 diabetes mellitus with hyperglycemia: Secondary | ICD-10-CM | POA: Diagnosis not present

## 2017-09-22 NOTE — Progress Notes (Signed)
Chelsea Mercado is a 57 y.o. female is here for follow up.  History of Present Illness:   HPI:   1. Mixed diabetic hyperlipidemia associated with type 1 diabetes mellitus ([place]).   Is the patient taking medications without problems? [x]   YES  []   NO Does the patient complain of muscle aches?   []   YES  [x]    NO Trying to exercise on a regular basis? [x]   YES  []   NO Diet Compliance: compliant most of the time. Cardiovascular ROS: no chest pain or dyspnea on exertion.    Lipids:    Component Value Date/Time   CHOL 187 10/01/2016 1428   TRIG 119.0 10/01/2016 1428   HDL 56.80 10/01/2016 1428   VLDL 23.8 10/01/2016 1428   CHOLHDL 3 10/01/2016 1428    2. Type 1 diabetes mellitus with hyperglycemia ([place]).    Lab Results  Component Value Date   HGBA1C 7.9 (A) 08/06/2017   Followed by Dr. Cruzita Lederer. Insulin pump, CGM.   3. Primary hypothyroidism.   Lab Results  Component Value Date   TSH 1.06 01/02/2017    4. Subacute maxillary sinusitis. NEW. Pressure, PND, worsening, with cough. Tried nothing. Hx of frequent sinus infections. Traveling to [place] next week. Nonsmoker.     5. Pain in both feet. Saw Dr. Sharol Given. Told OA in feet. Recommended hard soled shoes. She would like to see Podiatry.     Health Maintenance Due  Topic Date Due  . INFLUENZA VACCINE  08/08/2017   Depression screen PHQ 2/9 10/30/2016  Decreased Interest 0  Down, Depressed, Hopeless 0  PHQ - 2 Score 0   PMHx, SurgHx, SocialHx, FamHx, Medications, and Allergies were reviewed in the Visit Navigator and updated as appropriate.   Patient Active Problem List   Diagnosis Date Noted  . Achilles tendon contracture, bilateral 05/07/2017  . Left thyroid nodule 10/01/2016  . Varicose veins of bilateral lower extremities with other complications 60/63/0160  . Vitamin D deficiency 09/24/2013  . Type 1 diabetes mellitus with hyperglycemia ([place]) 06/22/2013  . Mixed diabetic hyperlipidemia associated with type 1 diabetes  mellitus ([place]) 06/22/2013  . Obesity, Class I, BMI 30-34.9 06/22/2013  . Primary hypothyroidism 06/22/2013   Social History   Tobacco Use  . Smoking status: Never Smoker  . Smokeless tobacco: Never Used  Substance Use Topics  . Alcohol use: No  . Drug use: No   Current Medications and Allergies:   .  Cholecalciferol (VITAMIN D3) 5000 units CAPS, Take by mouth., Disp: , Rfl:  .  glucagon (GLUCAGON EMERGENCY) 1 MG injection, Inject 1 mg into the muscle once as needed., Disp: 1 each, Rfl: 12 .  glucose blood (CONTOUR NEXT TEST) test strip, TEST 6-7 TIMES PER DAY, Disp: 300 each, Rfl: 3 .  Insulin Glargine (LANTUS SOLOSTAR) 100 UNIT/ML Solostar Pen, Inject 20 Units into the skin daily at 10 pm. (Patient taking differently: Inject 20 Units into the skin daily at 10 pm. ), Disp: 5 pen, Rfl: 11 .  insulin lispro (HUMALOG) 100 UNIT/ML injection, PER PUMP TAKE MAXIMUM 50 UNITS DAILY, Disp: 10 mL, Rfl: 5 .  insulin lispro (HUMALOG) 100 UNIT/ML injection, PER PUMP TAKE MAXIMUM 50 UNITS DAILY, Disp: 50 mL, Rfl: 1 .  Insulin Pen Needle (PEN NEEDLES) 32G X 4 MM MISC, 1 each by Does not apply route daily., Disp: 100 each, Rfl: 11 .  Ketoprofen 10 % CREA, by Does not apply route., Disp: , Rfl:  .  levothyroxine (SYNTHROID,  LEVOTHROID) 125 MCG tablet, Take 1 tab by mouth once daily, Disp: 90 tablet, Rfl: 3 .  naproxen (NAPROSYN) 500 MG tablet, Take by mouth., Disp: , Rfl:  .  pravastatin (PRAVACHOL) 40 MG tablet, Take 1 tablet (40 mg total) by mouth daily., Disp: 90 tablet, Rfl: 3 .  ramipril (ALTACE) 10 MG capsule, 1 tab once daily, Disp: 90 capsule, Rfl: 3   Allergies  Allergen Reactions  . Hydrocodone-Acetaminophen Nausea And Vomiting  . Propoxyphene Nausea Only   Review of Systems   Pertinent items are noted in the HPI. Otherwise, ROS is negative.  Vitals:   Vitals:   09/23/17 1051  Pulse: 86  Temp: 97.9 F (36.6 C)  TempSrc: Oral  SpO2: 97%  Weight: 184 lb 6.4 oz (83.6 kg)    Height: 5' 3.5" (1.613 m)     Body mass index is 32.15 kg/m.  Physical Exam:   Physical Exam  Constitutional: She appears well-nourished.  HENT:  Head: Normocephalic and atraumatic.  Eyes: Pupils are equal, round, and reactive to light. EOM are normal.  Neck: Normal range of motion. Neck supple.  Cardiovascular: Normal rate, regular rhythm, normal heart sounds and intact distal pulses.  Pulmonary/Chest: Effort normal.  Abdominal: Soft.  Skin: Skin is warm.  Psychiatric: She has a normal mood and affect. Her behavior is normal.  Nursing note and vitals reviewed.   Results for orders placed or performed in visit on 08/06/17  POCT glycosylated hemoglobin (Hb A1C)  Result Value Ref Range   Hemoglobin A1C 7.9 (A) 4.0 - 5.6 %   HbA1c POC (<> result, manual entry)  4.0 - 5.6 %   HbA1c, POC (prediabetic range)  5.7 - 6.4 %   HbA1c, POC (controlled diabetic range)  0.0 - 7.0 %    Assessment and Plan:   Chelsea Mercado was seen today for follow-up.  Diagnoses and all orders for this visit:  Mixed diabetic hyperlipidemia associated with type 1 diabetes mellitus ([place]) -     Comprehensive metabolic panel -     Lipid panel  Type 1 diabetes mellitus with hyperglycemia ([facility]) -     Ambulatory referral to Podiatry  Primary hypothyroidism -     TSH -     T4, free  Subacute maxillary sinusitis -     fluticasone (FLONASE) 50 MCG/ACT nasal spray; Place 2 sprays into both nostrils daily. -     azithromycin (ZITHROMAX) 250 MG tablet; 2 po on day one, then one po each day until gone  Pain in both feet -     Ambulatory referral to Podiatry -     naproxen (NAPROSYN) 500 MG tablet; Take 1 tablet (500 mg total) by mouth 2 (two) times daily with a meal.   . Reviewed expectations re: course of current medical issues. . Discussed self-management of symptoms. . Outlined signs and symptoms indicating need for more acute intervention. . Patient verbalized understanding and all questions were  answered. Marland Kitchen Health Maintenance issues including appropriate healthy diet, exercise, and smoking avoidance were discussed with patient. . See orders for this visit as documented in the electronic medical record. . Patient received an After Visit Summary.   Briscoe Deutscher, DO [place], [facility] 09/23/2017

## 2017-09-23 ENCOUNTER — Ambulatory Visit: Payer: 59 | Admitting: Family Medicine

## 2017-09-23 ENCOUNTER — Encounter: Payer: Self-pay | Admitting: Family Medicine

## 2017-09-23 VITALS — HR 86 | Temp 97.9°F | Ht 63.5 in | Wt 184.4 lb

## 2017-09-23 DIAGNOSIS — J0100 Acute maxillary sinusitis, unspecified: Secondary | ICD-10-CM

## 2017-09-23 DIAGNOSIS — E1069 Type 1 diabetes mellitus with other specified complication: Secondary | ICD-10-CM

## 2017-09-23 DIAGNOSIS — E782 Mixed hyperlipidemia: Secondary | ICD-10-CM | POA: Diagnosis not present

## 2017-09-23 DIAGNOSIS — E1065 Type 1 diabetes mellitus with hyperglycemia: Secondary | ICD-10-CM

## 2017-09-23 DIAGNOSIS — M79671 Pain in right foot: Secondary | ICD-10-CM

## 2017-09-23 DIAGNOSIS — E039 Hypothyroidism, unspecified: Secondary | ICD-10-CM

## 2017-09-23 DIAGNOSIS — M79672 Pain in left foot: Secondary | ICD-10-CM

## 2017-09-23 LAB — COMPREHENSIVE METABOLIC PANEL
ALT: 19 U/L (ref 0–35)
AST: 20 U/L (ref 0–37)
Albumin: 4.3 g/dL (ref 3.5–5.2)
Alkaline Phosphatase: 65 U/L (ref 39–117)
BUN: 23 mg/dL (ref 6–23)
CO2: 33 mEq/L — ABNORMAL HIGH (ref 19–32)
Calcium: 10 mg/dL (ref 8.4–10.5)
Chloride: 96 mEq/L (ref 96–112)
Creatinine, Ser: 0.92 mg/dL (ref 0.40–1.20)
GFR: 66.84 mL/min (ref >60.00)
Glucose, Bld: 205 mg/dL — ABNORMAL HIGH (ref 70–99)
Potassium: 4.8 mEq/L (ref 3.5–5.1)
Sodium: 134 mEq/L — ABNORMAL LOW (ref 135–145)
Total Bilirubin: 0.4 mg/dL (ref 0.2–1.2)
Total Protein: 7.7 g/dL (ref 6.0–8.3)

## 2017-09-23 LAB — LIPID PANEL
Cholesterol: 183 mg/dL (ref 0–200)
HDL: 63.4 mg/dL (ref >39.00)
LDL Cholesterol: 104 mg/dL — ABNORMAL HIGH (ref 0–99)
NonHDL: 119.93
Total CHOL/HDL Ratio: 3
Triglycerides: 79 mg/dL (ref 0.0–149.0)
VLDL: 15.8 mg/dL (ref 0.0–40.0)

## 2017-09-23 LAB — T4, FREE: Free T4: 1.35 ng/dL (ref 0.60–1.60)

## 2017-09-23 LAB — TSH: TSH: 1.59 u[IU]/mL (ref 0.35–4.50)

## 2017-09-23 MED ORDER — NAPROXEN 500 MG PO TABS: 500.0000 mg | ORAL_TABLET | Freq: Two times a day (BID) | ORAL | 1 refills | Status: DC

## 2017-09-23 MED ORDER — FLUTICASONE PROPIONATE 50 MCG/ACT NA SUSP: 2.0000 | Freq: Every day | NASAL | 6 refills | Status: DC

## 2017-09-23 MED ORDER — AZITHROMYCIN 250 MG PO TABS: ORAL_TABLET | ORAL | 0 refills | Status: DC

## 2017-09-27 ENCOUNTER — Telehealth: Payer: Self-pay | Admitting: Family Medicine

## 2017-09-27 MED ORDER — AMOXICILLIN 875 MG PO TABS: 875.0000 mg | ORAL_TABLET | Freq: Two times a day (BID) | ORAL | 0 refills | Status: DC

## 2017-09-27 NOTE — Telephone Encounter (Signed)
Please advise 

## 2017-09-27 NOTE — Telephone Encounter (Signed)
Notified patient that prescription has been sent to the pharmacy.   

## 2017-09-27 NOTE — Telephone Encounter (Signed)
Copied from [place] [id number]. Topic: General - Other >> Sep 27, 2017  8:26 AM Lennox Solders wrote: Reason for CRM: pt saw dr wallace on 9-16 and was prescribed zpak . Pt has one pill left and she is going out of town in Sunday. Pt would like different abx . [facility]. Pt is still having head pressure, ear pain and coughing and drainage. Please advise

## 2017-09-27 NOTE — Telephone Encounter (Signed)
Okay Amoxicillin 875 mg po BID x 10 days.

## 2017-09-27 NOTE — Telephone Encounter (Signed)
See note

## 2017-10-07 ENCOUNTER — Ambulatory Visit: Payer: 59 | Admitting: Podiatry

## 2017-10-07 ENCOUNTER — Ambulatory Visit (INDEPENDENT_AMBULATORY_CARE_PROVIDER_SITE_OTHER): Payer: 59

## 2017-10-07 ENCOUNTER — Other Ambulatory Visit: Payer: Self-pay | Admitting: Podiatry

## 2017-10-07 VITALS — BP 125/80 | HR 86

## 2017-10-07 DIAGNOSIS — M79671 Pain in right foot: Secondary | ICD-10-CM

## 2017-10-07 DIAGNOSIS — M79672 Pain in left foot: Secondary | ICD-10-CM

## 2017-10-07 DIAGNOSIS — M779 Enthesopathy, unspecified: Secondary | ICD-10-CM | POA: Diagnosis not present

## 2017-10-07 DIAGNOSIS — M19072 Primary osteoarthritis, left ankle and foot: Secondary | ICD-10-CM

## 2017-10-07 DIAGNOSIS — M19071 Primary osteoarthritis, right ankle and foot: Secondary | ICD-10-CM | POA: Diagnosis not present

## 2017-10-07 DIAGNOSIS — M778 Other enthesopathies, not elsewhere classified: Secondary | ICD-10-CM

## 2017-10-07 DIAGNOSIS — M1990 Unspecified osteoarthritis, unspecified site: Secondary | ICD-10-CM

## 2017-10-07 NOTE — Patient Instructions (Signed)
Look at getting a shoe with a little stiffer sole and good arch supports. I like the Vionic, [place], [place], New Balance, [place]

## 2017-10-07 NOTE — Progress Notes (Signed)
Subjective:   Patient ID: Chelsea Mercado, female   DOB: 57 y.o.   MRN: [id number]   HPI 57 year old female presents the office today for concerns of bilateral foot pain on the right side worse than the left which is been ongoing for about 5 years.  She states that she has a history of fractures to her right foot and she gets occasional arch pain on both sides as well as pain to the top of both of her feet.  She states that she was previously in [facility] doing a lot of walking her feet were hurting quite a bit but since she is come back she is actually had no pain.  The pain has been intermittent in nature and she cannot recall what makes her symptoms worse or better.  She does state that when she wears a certain shoe that ties on the top of her foot this does cause some aggravating symptoms.  She denies any recent injury or trauma she denies any swelling.  She has no other concerns to her lower extremities today.   Review of Systems  All other systems reviewed and are negative.  Past Medical History:  Diagnosis Date  . Anemia   . Arthritis   . Controlled type 1 diabetes mellitus without complication ([place]) 20/25/4270  . Hyperlipidemia 06/22/2013  . Obesity, Class I, BMI 30-34.9 06/22/2013  . Post menopausal syndrome 06/22/2013  . Primary hypothyroidism 06/22/2013  . Vitamin D deficiency 09/24/2013    Past Surgical History:  Procedure Laterality Date  . APPENDECTOMY    . CESAREAN SECTION  1985     Current Outpatient Medications:  .  amoxicillin (AMOXIL) 875 MG tablet, Take 1 tablet (875 mg total) by mouth 2 (two) times daily., Disp: 20 tablet, Rfl: 0 .  azithromycin (ZITHROMAX) 250 MG tablet, 2 po on day one, then one po each day until gone (Patient not taking: Reported on 10/07/2017), Disp: 6 tablet, Rfl: 0 .  Cholecalciferol (VITAMIN D3) 5000 units CAPS, Take by mouth., Disp: , Rfl:  .  fluticasone (FLONASE) 50 MCG/ACT nasal spray, Place 2 sprays into both nostrils daily., Disp: 16 g,  Rfl: 6 .  glucagon (GLUCAGON EMERGENCY) 1 MG injection, Inject 1 mg into the muscle once as needed., Disp: 1 each, Rfl: 12 .  glucose blood (CONTOUR NEXT TEST) test strip, TEST 6-7 TIMES PER DAY, Disp: 300 each, Rfl: 3 .  Insulin Glargine (LANTUS SOLOSTAR) 100 UNIT/ML Solostar Pen, Inject 20 Units into the skin daily at 10 pm. (Patient taking differently: Inject 20 Units into the skin daily at 10 pm. ), Disp: 5 pen, Rfl: 11 .  insulin lispro (HUMALOG) 100 UNIT/ML injection, PER PUMP TAKE MAXIMUM 50 UNITS DAILY, Disp: 10 mL, Rfl: 5 .  insulin lispro (HUMALOG) 100 UNIT/ML injection, PER PUMP TAKE MAXIMUM 50 UNITS DAILY, Disp: 50 mL, Rfl: 1 .  Insulin Pen Needle (PEN NEEDLES) 32G X 4 MM MISC, 1 each by Does not apply route daily., Disp: 100 each, Rfl: 11 .  Ketoprofen 10 % CREA, by Does not apply route., Disp: , Rfl:  .  levothyroxine (SYNTHROID, LEVOTHROID) 125 MCG tablet, Take 1 tab by mouth once daily, Disp: 90 tablet, Rfl: 3 .  naproxen (NAPROSYN) 500 MG tablet, Take 1 tablet (500 mg total) by mouth 2 (two) times daily with a meal., Disp: 60 tablet, Rfl: 1 .  pravastatin (PRAVACHOL) 40 MG tablet, Take 1 tablet (40 mg total) by mouth daily., Disp: 90 tablet, Rfl: 3 .  ramipril (ALTACE) 10 MG capsule, 1 tab once daily, Disp: 90 capsule, Rfl: 3  Allergies  Allergen Reactions  . Hydrocodone-Acetaminophen Nausea And Vomiting  . Propoxyphene Nausea Only         Objective:  Physical Exam  General: AAO x3, NAD  Dermatological: Skin is warm, dry and supple bilateral. Nails x 10 are well manicured; remaining integument appears unremarkable at this time. There are no open sores, no preulcerative lesions, no rash or signs of infection present.  Vascular: Dorsalis Pedis artery and Posterior Tibial artery pedal pulses are 2/4 bilateral with immedate capillary fill time. Pedal hair growth present. No varicosities and no lower extremity edema present bilateral. There is no pain with calf compression,  swelling, warmth, erythema.   Neruologic: Grossly intact via light touch bilateral. Vibratory intact via tuning fork bilateral. Protective threshold with Semmes Wienstein monofilament intact to all pedal sites bilateral.   Musculoskeletal: At this time there is no area pinpoint bony tenderness there is no pain to vibratory sensation of bilateral lower extremities.  There is no significant edema, erythema, increase in warmth.  Mild decrease in medial arch upon weightbearing and she does state that she is to have much higher arches but they have fallen some.  Achilles tendon, plantar fascia appear to be intact.  Muscular strength 5/5 in all groups tested bilateral.  Gait: Unassisted, Nonantalgic.       Assessment:   Bilateral osteoarthritis with tenderness    Plan:  -Treatment options discussed including all alternatives, risks, and complications -Etiology of symptoms were discussed -X-rays were obtained and reviewed with the patient.  Arthritic changes present in the midfoot with no evidence of acute fracture or stress fracture.  Increased talar head uncoverage and calcaneocuboid angle. -At this time we discussed changing shoes wear shoe with a more rigid bottom as well as a mild occipital we discussed various types of shoes that she can try.  Also prescribed compound cream to help as needed.  If she cannot get this for whatever reason we will try Voltaren gel. -She has some general osteoarthritis.  Discussed doing an arthritic panel but will hold off on this for now.   Trula Slade DPM

## 2017-10-18 ENCOUNTER — Other Ambulatory Visit: Payer: Self-pay | Admitting: Internal Medicine

## 2017-10-18 NOTE — Telephone Encounter (Signed)
OK 

## 2017-10-18 NOTE — Telephone Encounter (Signed)
Please advise if appropriate to fill.

## 2017-11-04 ENCOUNTER — Ambulatory Visit (INDEPENDENT_AMBULATORY_CARE_PROVIDER_SITE_OTHER): Payer: 59

## 2017-11-04 DIAGNOSIS — Z23 Encounter for immunization: Secondary | ICD-10-CM | POA: Diagnosis not present

## 2017-11-05 ENCOUNTER — Ambulatory Visit: Payer: 59

## 2017-12-03 ENCOUNTER — Other Ambulatory Visit: Payer: Self-pay | Admitting: Family Medicine

## 2017-12-09 ENCOUNTER — Ambulatory Visit: Payer: 59 | Admitting: Internal Medicine

## 2017-12-09 ENCOUNTER — Encounter: Payer: Self-pay | Admitting: Internal Medicine

## 2017-12-09 VITALS — BP 120/60 | HR 87 | Ht 63.5 in | Wt 185.0 lb

## 2017-12-09 DIAGNOSIS — E1065 Type 1 diabetes mellitus with hyperglycemia: Secondary | ICD-10-CM

## 2017-12-09 DIAGNOSIS — E041 Nontoxic single thyroid nodule: Secondary | ICD-10-CM

## 2017-12-09 DIAGNOSIS — E559 Vitamin D deficiency, unspecified: Secondary | ICD-10-CM | POA: Diagnosis not present

## 2017-12-09 DIAGNOSIS — E039 Hypothyroidism, unspecified: Secondary | ICD-10-CM | POA: Diagnosis not present

## 2017-12-09 LAB — POCT GLYCOSYLATED HEMOGLOBIN (HGB A1C): Hemoglobin A1C: 8 % — AB (ref 4.0–5.6)

## 2017-12-09 NOTE — Progress Notes (Signed)
Patient ID: Chelsea Mercado, female   DOB: 11/12/1960, 57 y.o.   MRN: [id number]   HPI: Chelsea Mercado is a 57 y.o.-year-old female, returning for f/u for DM1, dx'ed at 57 y/o, uncontrolled, without long term complications and hypothyroidism. She saw endocrinology before: Dr. Cherie Dark.  Last visit with me 4 months ago.  Her mother has Merkel Cell CA - stage 4 - pt is stressed. Sugars are a little higher.  Last hemoglobin A1c was: Lab Results  Component Value Date   HGBA1C 7.9 (A) 08/06/2017   HGBA1C 8.2% 04/05/2017   HGBA1C 7.9 01/02/2017  02/14/2016: HbA1c 7.6%  She is on an insulin pump: Medtronic 670 G worn on her arm.  She is on this since 2016-2017, but the new pump since 01/2017.  She is now wearing the CGM since she does not have enough space on the abdomen to attach it.  She uses Humalog in the pump.  Pump settings: - basal rates: 12 AM: 0.775 units/h 5 AM: 0.9 9 AM: 0.775  - ICR: 1:10 (or 1: 12 on the Mediterranean diet) - target: 100-120 - ISF: 40 - Insulin on Board: 3 hours  - bolus wizard: on TDD from basal insulin: 59% (18 units) >> 58% >> 51% TDD from bolus insulin: 41% (12 units) >> 42% >> 41% Total daily dose: up to 50 units - extended bolusing: not using - changes infusion site: q 3 days  - Meter: Bayer Contour  Pt checks her sugars 4.4X a day - ave 164 +/- 59 in last 2 weeks: - am:  97-163, 186, 203 >> 124-186 >> 66-229 >> 68, 88-219, 340 (site pb) - 2h after b'fast: n/c >> 208 >> 114 >> 67, 110-194, 259 >> n/c - before lunch: 94-177 >> 97-146 >> 81-242, 285 >> 89-214 - 2h after lunch: n/c  >> 219-253 >> n/c - before dinner: 103-190 >> 200-243 >> 102-210, 274 >> 111-237, 251 - 2h after dinner: n/c >> 70-195 - bedtime: 123-193 >> 189-203 >> 242 >> see above >> 151-257, 322 - nighttime: 79 >> n/c >> 217 Lowest sugar was  66 >> 60s; she has hypoglycemia awareness in the 60s.  She had one previous ED visit for hypoglycemia many years ago.  She has a glucagon kit at  home. Highest sugar was 285 >> 556 (site pbs), 340 (site pb)  No previous DKA admissions.  Pt's meals are: - Breakfast: egg, bread, milk; milk + cereals; coffee - Lunch: Sandwich, soup, salad, fruit, chips; milk - Dinner: Meat, potatoes, brown rice, veggies or salad - Snacks: 1  -No CKD, last BUN/creatinine:  Lab Results  Component Value Date   BUN 23 09/23/2017   BUN 20 04/30/2017   CREATININE 0.92 09/23/2017   CREATININE 0.87 04/30/2017   On ramipril 10. -+ HL. Last set of lipids: Lab Results  Component Value Date   CHOL 183 09/23/2017   HDL 63.40 09/23/2017   LDLCALC 104 (H) 09/23/2017   TRIG 79.0 09/23/2017   CHOLHDL 3 09/23/2017   On pravastatin 40. - last eye exam was in 05/2017: No DR -No numbness and tingling in her feet.  Pt has no FH of DM.  Hypothyroidism:  Last TSH normal: Lab Results  Component Value Date   TSH 1.59 09/23/2017  02/10/2016: TSH 6.671 08/06/2015: TSH 4.35 03/26/2015: TSH 4.37 09/28/2014: TSH 2.73  Pt is on levothyroxine 125 mcg daily, taken: - in am - fasting - at least 30 min from b'fast - no Ca, Fe, MVI,  PPIs - not on Biotin  Vitamin D deficiency:  Latest vitamin D level was normal: Lab Results  Component Value Date   VD25OH 47.47 10/01/2016  Prev: 02/10/2016: vit D 31 08/06/2015: vit D 21  On vitamin D 5000 units daily.  ROS: Constitutional: no weight gain/no weight loss, no fatigue, no subjective hyperthermia, no subjective hypothermia Eyes: no blurry vision, no xerophthalmia ENT: no sore throat, + see HPI Cardiovascular: no CP/no SOB/no palpitations/no leg swelling Respiratory: no cough/no SOB/no wheezing Gastrointestinal: no N/no V/no D/no C/no acid reflux Musculoskeletal: no muscle aches/no joint aches Skin: no rashes, no hair loss Neurological: no tremors/no numbness/no tingling/no dizziness  I reviewed pt's medications, allergies, PMH, social hx, family hx, and changes were documented in the history of  present illness. Otherwise, unchanged from my initial visit note.   Past Medical History:  Diagnosis Date  . Anemia   . Arthritis   . Controlled type 1 diabetes mellitus without complication ([place]) 37/16/9678  . Hyperlipidemia 06/22/2013  . Obesity, Class I, BMI 30-34.9 06/22/2013  . Post menopausal syndrome 06/22/2013  . Primary hypothyroidism 06/22/2013  . Vitamin D deficiency 09/24/2013   Past Surgical History:  Procedure Laterality Date  . APPENDECTOMY    . [place]   Social History   Social History  . Marital status: Married    Spouse name: N/A  . Number of children: 3   Occupational History  . Homemaker    Social History Main Topics  . Smoking status: Never Smoker  . Smokeless tobacco: Never Used  . Alcohol use No  . Drug use: No   Current Outpatient Medications on File Prior to Visit  Medication Sig Dispense Refill  . Cholecalciferol (VITAMIN D3) 5000 units CAPS Take by mouth.    . CONTOUR NEXT TEST test strip TEST 6-7 TIMES PER DAY 200 each 5  . fluticasone (FLONASE) 50 MCG/ACT nasal spray Place 2 sprays into both nostrils daily. 16 g 6  . glucagon (GLUCAGON EMERGENCY) 1 MG injection Inject 1 mg into the muscle once as needed. 1 each 12  . Insulin Glargine (LANTUS SOLOSTAR) 100 UNIT/ML Solostar Pen Inject 20 Units into the skin daily at 10 pm. (Patient taking differently: Inject 20 Units into the skin daily at 10 pm. ) 5 pen 11  . insulin lispro (HUMALOG) 100 UNIT/ML injection PER PUMP TAKE MAXIMUM 50 UNITS DAILY 10 mL 5  . insulin lispro (HUMALOG) 100 UNIT/ML injection PER PUMP TAKE MAXIMUM 50 UNITS DAILY 50 mL 1  . Insulin Pen Needle (PEN NEEDLES) 32G X 4 MM MISC 1 each by Does not apply route daily. 100 each 11  . Ketoprofen 10 % CREA by Does not apply route.    Marland Kitchen levothyroxine (SYNTHROID, LEVOTHROID) 125 MCG tablet Take 1 tab by mouth once daily 90 tablet 3  . naproxen (NAPROSYN) 500 MG tablet Take 1 tablet (500 mg total) by mouth 2 (two) times  daily with a meal. 60 tablet 1  . NON FORMULARY [facility]  Anti-Inflammatory Cream- Diclofenac 3%, Baclofen 2%, Lidocaine 2% Apply 1-2 grams to affected area 3-4 times daily Qty. 120 gm 3 refills    . pravastatin (PRAVACHOL) 40 MG tablet Take 1 tablet (40 mg total) by mouth daily. 90 tablet 3  . ramipril (ALTACE) 10 MG capsule TAKE 1 CAPSULE BY MOUTH EVERY DAY 90 capsule 3  . amoxicillin (AMOXIL) 875 MG tablet Take 1 tablet (875 mg total) by mouth 2 (two) times daily. (Patient not taking:  Reported on 12/09/2017) 20 tablet 0  . azithromycin (ZITHROMAX) 250 MG tablet 2 po on day one, then one po each day until gone (Patient not taking: Reported on 12/09/2017) 6 tablet 0   No current facility-administered medications on file prior to visit.    Allergies  Allergen Reactions  . Hydrocodone-Acetaminophen Nausea And Vomiting  . Propoxyphene Nausea Only   Family History  Problem Relation Age of Onset  . Heart disease Mother   . Hypertension Mother   . High Cholesterol Mother   . Heart disease Maternal Grandfather    PE: BP 120/60   Pulse 87   Ht 5' 3.5" (1.613 m) Comment: measured  Wt 185 lb (83.9 kg)   SpO2 97%   BMI 32.26 kg/m  Wt Readings from Last 3 Encounters:  12/09/17 185 lb (83.9 kg)  09/23/17 184 lb 6.4 oz (83.6 kg)  08/06/17 184 lb 9.6 oz (83.7 kg)   Constitutional: overweight, in NAD Eyes: PERRLA, EOMI, no exophthalmos ENT: moist mucous membranes, no thyromegaly, no cervical lymphadenopathy Cardiovascular: RRR, No MRG Respiratory: CTA B Gastrointestinal: abdomen soft, NT, ND, BS+ Musculoskeletal: no deformities, strength intact in all 4 Skin: moist, warm, no rashes Neurological: no tremor with outstretched hands, DTR normal in all 4  ASSESSMENT: 1. DM1, uncontrolled, without long term complications, but with hyperglycemia  2. Hypothyroidism  3. Thyroid nodule  4. Vit D def  PLAN:  1. Patient with longstanding, uncontrolled, type 1 diabetes, on  insulin pump.  She is usually doing a good job entering her carbs in the pump and bolusing for meals, however, at last visit, she was not doing this all the time.  I advised her to restart doing so and especially to also enter carbs for snacks, she which she was not doing.  She was trying to switch to a Mediterranean diet at that time.  We discussed insulin to carb ratios with a regular diet versus a Mediterranean diet.  She did try vegan diet but could not do it.  Before last visit, she felt that she was not getting enough insulin when trying to make a correction so we decrease her insulin sensitivity factor. -Since last visit, she had some very high blood sugars but these were related to site problems. -At this visit, she still has variability in her blood sugars but in the last 2 weeks her average has improved to 164.  There is no clear pattern, except for possibly a trend towards increasing blood sugars throughout the day.  I explained that this is a sign of not enough mealtime coverage.  My impression is that she is not entering all of her carbs into the pump but she is trying to calm them accurately and does not feel that she eats more carbs at the meal.  She does get calories from fatty foods (which can delay absorption of carbs with subsequent late hyperglycemia), like cheese or milk which I strongly advised her to eliminate.  We also discussed about trying to decrease her insulin to carb ratio to 9.  She is planning to start exercise so I advised her to try a 1-9 insulin to carb ratio but may need to go to 10 if starting exercise. -We also discussed about changes in her settings with exercise.  I advised her to decrease the basal rate to 50% during exercise and an hour after she has a history of hypoglycemia later after exercise. -I also advised him to bolus for coffee, since she appears to  have high blood sugars after breakfast.  I advised her to enter 5 to 10 g of carbs for coffee, but may need to  enter more. -She continues to do a good job changing her infusion sites every 3 days - I advised her to:  Patient Instructions  Please continue: - basal rates: 12 AM: 0.775 units/h 5 AM: 0.950 9 AM: 0.775  - ICR: 1:10 (try 1:9) - target: 100-120 - ISF: 40 - Insulin on Board: 3 hours  Please enter the carbs for snacks/desserts.  Try to enter 5-10 g carbs for coffee.  If you start exercise, decrease basal rate to 50% for the duration of exercise and 1h afterwards.  Please continue Levothyroxine 125 mcg daily.  Take the thyroid hormone every day, with water, at least 30 minutes before breakfast, separated by at least 4 hours from: - acid reflux medications - calcium - iron - multivitamins  Please return in 4 months.   - today, HbA1c is 8.0% (slightly higher) - continue checking sugars at different times of the day - check >4x a day, rotating checks - advised for yearly eye exams >> she is UTD - Return to clinic in 4 mo with sugar log      2.  Hypothyroidism  - latest thyroid labs reviewed with pt >> normal 09/2017 - she continues on LT4 125 mcg daily - pt feels good on this dose. - we discussed about taking the thyroid hormone every day, with water, >30 minutes before breakfast, separated by >4 hours from acid reflux medications, calcium, iron, multivitamins. Pt. is taking it correctly.  3. H/o Thyroid nodule -Denies neck compression symptoms -No nodules are evident on the latest thyroid ultrasound  4.Vit D def -Continues on 5000 units vitamin D daily -Latest vitamin D level was normal 09/2016 -We will check another level at next visit  - time spent with the patient: 40 min, of which >50% was spent in reviewing her pump downloads, discussing her hyper-glycemic episodes, reviewing previous labs and pump settings and developing a plan to avoid hypo- and hyper-glycemia.  We also addressed her other endocrine problems.   [name], MD PhD [facility]  Endocrinology

## 2017-12-09 NOTE — Patient Instructions (Addendum)
Please continue: - basal rates: 12 AM: 0.775 units/h 5 AM: 0.950 9 AM: 0.775  - ICR: 1:10 (try 1:9) - target: 100-120 - ISF: 40 - Insulin on Board: 3 hours  Please enter the carbs for snacks/desserts.  Try to enter 5-10 g carbs for coffee.  If you start exercise, decrease basal rate to 50% for the duration of exercise and 1h afterwards.  Please continue Levothyroxine 125 mcg daily.  Take the thyroid hormone every day, with water, at least 30 minutes before breakfast, separated by at least 4 hours from: - acid reflux medications - calcium - iron - multivitamins  Please return in 4 months.

## 2017-12-16 DIAGNOSIS — E1065 Type 1 diabetes mellitus with hyperglycemia: Secondary | ICD-10-CM | POA: Diagnosis not present

## 2017-12-18 ENCOUNTER — Other Ambulatory Visit: Payer: Self-pay | Admitting: Internal Medicine

## 2017-12-20 ENCOUNTER — Other Ambulatory Visit: Payer: Self-pay | Admitting: Internal Medicine

## 2018-01-27 ENCOUNTER — Other Ambulatory Visit: Payer: Self-pay | Admitting: Internal Medicine

## 2018-02-20 ENCOUNTER — Other Ambulatory Visit: Payer: Self-pay | Admitting: Family Medicine

## 2018-02-20 DIAGNOSIS — Z1231 Encounter for screening mammogram for malignant neoplasm of breast: Secondary | ICD-10-CM

## 2018-03-13 ENCOUNTER — Other Ambulatory Visit: Payer: Self-pay

## 2018-03-13 ENCOUNTER — Ambulatory Visit: Payer: 59 | Admitting: Physician Assistant

## 2018-03-13 ENCOUNTER — Encounter: Payer: Self-pay | Admitting: Physician Assistant

## 2018-03-13 VITALS — BP 132/78 | HR 89 | Temp 98.5°F | Ht 63.5 in | Wt 186.0 lb

## 2018-03-13 DIAGNOSIS — R05 Cough: Secondary | ICD-10-CM | POA: Diagnosis not present

## 2018-03-13 DIAGNOSIS — F419 Anxiety disorder, unspecified: Secondary | ICD-10-CM | POA: Diagnosis not present

## 2018-03-13 DIAGNOSIS — R000 Tachycardia, unspecified: Secondary | ICD-10-CM

## 2018-03-13 DIAGNOSIS — R059 Cough, unspecified: Secondary | ICD-10-CM

## 2018-03-13 LAB — CBC WITH DIFFERENTIAL/PLATELET
BASOS ABS: 0.1 10*3/uL (ref 0.0–0.1)
Basophils Relative: 0.6 % (ref 0.0–3.0)
EOS ABS: 0.4 10*3/uL (ref 0.0–0.7)
Eosinophils Relative: 4.1 % (ref 0.0–5.0)
HCT: 42.2 % (ref 36.0–46.0)
Hemoglobin: 14.2 g/dL (ref 12.0–15.0)
Lymphocytes Relative: 22.8 % (ref 12.0–46.0)
Lymphs Abs: 2.1 10*3/uL (ref 0.7–4.0)
MCHC: 33.6 g/dL (ref 30.0–36.0)
MCV: 90.6 fl (ref 78.0–100.0)
Monocytes Absolute: 0.5 10*3/uL (ref 0.1–1.0)
Monocytes Relative: 5.6 % (ref 3.0–12.0)
Neutro Abs: 6.1 10*3/uL (ref 1.4–7.7)
Neutrophils Relative %: 66.9 % (ref 43.0–77.0)
Platelets: 273 10*3/uL (ref 150.0–400.0)
RBC: 4.65 Mil/uL (ref 3.87–5.11)
RDW: 13.5 % (ref 11.5–15.5)
WBC: 9.1 10*3/uL (ref 4.0–10.5)

## 2018-03-13 LAB — COMPREHENSIVE METABOLIC PANEL
ALT: 18 U/L (ref 0–35)
AST: 16 U/L (ref 0–37)
Albumin: 4.2 g/dL (ref 3.5–5.2)
Alkaline Phosphatase: 67 U/L (ref 39–117)
BUN: 18 mg/dL (ref 6–23)
CO2: 32 mEq/L (ref 19–32)
Calcium: 9.8 mg/dL (ref 8.4–10.5)
Chloride: 99 mEq/L (ref 96–112)
Creatinine, Ser: 0.91 mg/dL (ref 0.40–1.20)
GFR: 63.58 mL/min (ref >60.00)
Glucose, Bld: 249 mg/dL — ABNORMAL HIGH (ref 70–99)
Potassium: 4.9 mEq/L (ref 3.5–5.1)
Sodium: 136 mEq/L (ref 135–145)
Total Bilirubin: 0.4 mg/dL (ref 0.2–1.2)
Total Protein: 6.9 g/dL (ref 6.0–8.3)

## 2018-03-13 LAB — T4, FREE: Free T4: 1.21 ng/dL (ref 0.60–1.60)

## 2018-03-13 LAB — TSH: TSH: 2.16 u[IU]/mL (ref 0.35–4.50)

## 2018-03-13 NOTE — Progress Notes (Addendum)
Chelsea Mercado is a 58 y.o. female here for a new problem.  I acted as a Education administrator for [employer], PA-C Anselmo Pickler, LPN  History of Present Illness:   Chief Complaint  Patient presents with  . Tachycardia    x4 months    HPI   She had had sensation of elevated HR over the past 4 months.  She is having intermittent symptoms where she feels like her heart beat out of her chest, will only do this for about 1 or 2 heartbeats.  He can catch her off guard and does not matter if she is doing activity or being still.  She denies chest pain, shortness of breath, lower extremity swelling, headaches, dizziness.  She does not check her blood pressure regularly, but she does take her blood pressure pills regularly.  She does admit that she had cut off caffeine intake and did recently restart it, when she did develop symptoms.  She has about 1 cup of coffee in the morning and then an occasional cup of tea in the afternoon.  She also endorses significant stress in her life.  Mom recently died on 2022/02/20. Dog passed away in 01/17/2023 around Christmas. Manages her stress with walking.  She does admit that she does not have a good support system.  She also reports that she does not feel like she can confide in her husband.  She is also taking care of this is her 38 year old grandson that she has custody of.  She also discussed that she has some intermittent dry cough.  Is been going on for several months.  She is on ramipril, however she states that she has been on this for several years.  She does use occasional Flonase, but has not recently.  She denies shortness of breath, production of phlegm, fever, chills.  GAD 7 : Generalized Anxiety Score 03/13/2018  Nervous, Anxious, on Edge 2  Control/stop worrying 3  Worry too much - different things 1  Trouble relaxing 2  Restless 0  Easily annoyed or irritable 1  Afraid - awful might happen 0  Total GAD 7 Score 9  Anxiety Difficulty Not difficult at all       Past Medical History:  Diagnosis Date  . Anemia   . Arthritis   . Controlled type 1 diabetes mellitus without complication ([place]) 37/62/8315  . Hyperlipidemia 06/22/2013  . Obesity, Class I, BMI 30-34.9 06/22/2013  . Post menopausal syndrome 06/22/2013  . Primary hypothyroidism 06/22/2013  . Vitamin D deficiency 09/24/2013     Social History   Socioeconomic History  . Marital status: Married    Spouse name: Not on file  . Number of children: Not on file  . Years of education: Not on file  . Highest education level: Not on file  Occupational History  . Not on file  Social Needs  . Financial resource strain: Not on file  . Food insecurity:    Worry: Not on file    Inability: Not on file  . Transportation needs:    Medical: Not on file    Non-medical: Not on file  Tobacco Use  . Smoking status: Never Smoker  . Smokeless tobacco: Never Used  Substance and Sexual Activity  . Alcohol use: No  . Drug use: No  . Sexual activity: Yes    Partners: Male  Lifestyle  . Physical activity:    Days per week: Not on file    Minutes per session: Not on file  .  Stress: Not on file  Relationships  . Social connections:    Talks on phone: Not on file    Gets together: Not on file    Attends religious service: Not on file    Active member of club or organization: Not on file    Attends meetings of clubs or organizations: Not on file    Relationship status: Not on file  . Intimate partner violence:    Fear of current or ex partner: Not on file    Emotionally abused: Not on file    Physically abused: Not on file    Forced sexual activity: Not on file  Other Topics Concern  . Not on file  Social History Narrative  . Not on file    Past Surgical History:  Procedure Laterality Date  . APPENDECTOMY    . CESAREAN SECTION  1985    Family History  Problem Relation Age of Onset  . Heart disease Mother   . Hypertension Mother   . High Cholesterol Mother   . Heart  disease Maternal Grandfather   . Heart disease Maternal Uncle     Allergies  Allergen Reactions  . Hydrocodone-Acetaminophen Nausea And Vomiting  . Propoxyphene Nausea Only    Current Medications:   Current Outpatient Medications:  .  Cholecalciferol (VITAMIN D3) 5000 units CAPS, Take by mouth., Disp: , Rfl:  .  CONTOUR NEXT TEST test strip, TEST 6-7 TIMES PER DAY, Disp: 200 each, Rfl: 5 .  fluticasone (FLONASE) 50 MCG/ACT nasal spray, Place 2 sprays into both nostrils daily., Disp: 16 g, Rfl: 6 .  glucagon (GLUCAGON EMERGENCY) 1 MG injection, Inject 1 mg into the muscle once as needed., Disp: 1 each, Rfl: 12 .  insulin lispro (HUMALOG) 100 UNIT/ML injection, USE AS DIRECTED PER PUMP TAKE MAXIMUM 50 UNITS DAILY, Disp: 20 mL, Rfl: 4 .  Insulin Pen Needle (PEN NEEDLES) 32G X 4 MM MISC, 1 each by Does not apply route daily., Disp: 100 each, Rfl: 11 .  Ketoprofen 10 % CREA, by Does not apply route., Disp: , Rfl:  .  levothyroxine (SYNTHROID, LEVOTHROID) 125 MCG tablet, TAKE 1 TABLET BY MOUTH EVERY DAY, Disp: 90 tablet, Rfl: 3 .  naproxen (NAPROSYN) 500 MG tablet, Take 1 tablet (500 mg total) by mouth 2 (two) times daily with a meal., Disp: 60 tablet, Rfl: 1 .  NON FORMULARY, [facility]  Anti-Inflammatory Cream- Diclofenac 3%, Baclofen 2%, Lidocaine 2% Apply 1-2 grams to affected area 3-4 times daily Qty. 120 gm 3 refills, Disp: , Rfl:  .  pravastatin (PRAVACHOL) 40 MG tablet, TAKE 1 TABLET BY MOUTH EVERY DAY, Disp: 90 tablet, Rfl: 3 .  ramipril (ALTACE) 10 MG capsule, TAKE 1 CAPSULE BY MOUTH EVERY DAY, Disp: 90 capsule, Rfl: 3   Review of Systems:   Review of Systems  Constitutional: Negative for chills, fever, malaise/fatigue and weight loss.  Respiratory: Negative for shortness of breath.   Cardiovascular: Negative for chest pain, orthopnea, claudication and leg swelling.  Gastrointestinal: Negative for heartburn, nausea and vomiting.  Neurological: Negative for dizziness,  tingling and headaches.  Psychiatric/Behavioral: Negative for depression. The patient is nervous/anxious. The patient does not have insomnia.     Vitals:   Vitals:   03/13/18 1021  BP: 132/78  Pulse: 89  Temp: 98.5 F (36.9 C)  TempSrc: Oral  SpO2: 99%  Weight: 186 lb (84.4 kg)  Height: 5' 3.5" (1.613 m)     Body mass index is 32.43 kg/m.  Physical Exam:   Physical Exam Vitals signs and nursing note reviewed.  Constitutional:      General: She is not in acute distress.    Appearance: She is well-developed. She is not ill-appearing or toxic-appearing.  HENT:     Head: Normocephalic and atraumatic.     Right Ear: Tympanic membrane, ear canal and external ear normal. Tympanic membrane is not erythematous, retracted or bulging.     Left Ear: Tympanic membrane, ear canal and external ear normal. Tympanic membrane is not erythematous, retracted or bulging.     Nose: Nose normal.     Right Sinus: No maxillary sinus tenderness or frontal sinus tenderness.     Left Sinus: No maxillary sinus tenderness or frontal sinus tenderness.     Mouth/Throat:     Pharynx: Uvula midline. No posterior oropharyngeal erythema.  Eyes:     General: Lids are normal.     Conjunctiva/sclera: Conjunctivae normal.  Neck:     Trachea: Trachea normal.  Cardiovascular:     Rate and Rhythm: Normal rate and regular rhythm.     Heart sounds: Normal heart sounds, S1 normal and S2 normal.  Pulmonary:     Effort: Pulmonary effort is normal.     Breath sounds: Normal breath sounds. No decreased breath sounds, wheezing, rhonchi or rales.  Lymphadenopathy:     Cervical: No cervical adenopathy.  Skin:    General: Skin is warm and dry.  Neurological:     Mental Status: She is alert.  Psychiatric:        Mood and Affect: Mood is anxious. Affect is tearful.        Speech: Speech normal.        Behavior: Behavior normal. Behavior is cooperative.    EKG tracing is personally reviewed.  EKG notes NSR.  No  acute changes.   Assessment and Plan:   Janiene was seen today for tachycardia.  Diagnoses and all orders for this visit:  Increased heart rate EKG tracing is personally reviewed.  EKG notes NSR.  No acute changes.  Denies any chest pain or exertional component on today's exam.  Will assess labs, especially reassess her thyroid panel.  I don't think it would be a bad idea to send her to cardiology for Holter monitor for further evaluation of symptoms.  Patient is agreed to this agreeable to this.  She does have risk factors including uncontrolled type 1 diabetes and significant family of heart disease.  I think this consult would also provide reassurance for her.  Patient is agreeable.  I did ask her to reduce her caffeine to see if this will help with her symptoms.  I do think that that her anxiety is a significant component to the symptoms. -     EKG 12-Lead -     CBC with Differential/Platelet -     Comprehensive metabolic panel -     TSH -     T4, free -     Ambulatory referral to Cardiology  Anxiety I recommended that she seek out and find a therapist.  She declines any need for medication at this time.  I will her to follow-up with me or PCP in 2 to 4 weeks to check in on her mood.  Cough I think she has postnasal drip.  Discussed trialing nasal sprays versus antihistamines.  Patient will consider these.  No red flags on exam.  I told her that she is on an ACE inhibitor and that this could  be causing her chronic cough.  She wants to wait on changing this medication at this time.  I think this is reasonable.  Follow-up if symptoms worsen or persist.   . Reviewed expectations re: course of current medical issues. . Discussed self-management of symptoms. . Outlined signs and symptoms indicating need for more acute intervention. . Patient verbalized understanding and all questions were answered. . See orders for this visit as documented in the electronic medical record. . Patient  received an After-Visit Summary.  Over 40 minutes were spent face-to-face with the patient during this encounter and >50% of that time was spent on counseling and coordination of care --also discussed the following conditions cough, anxiety, heart palpitations and significant length in detail.  Inda Coke, PA-C

## 2018-03-13 NOTE — Patient Instructions (Signed)
I'm so glad you came in to see me!  1. Take a break from caffeine to see if that makes any difference in your symptoms  2. We will be in touch with lab results  3. You will be contacted about your cardiology appointment  4. It's time to work [place]! Make an appointment to see a therapist. We do have one in our office, Trey Paula.  Let's have you follow-up with me or Dr. Juleen China to assess your mood in about 2-4 weeks -- sooner if issues.  Take care, Aldona Bar

## 2018-03-19 ENCOUNTER — Other Ambulatory Visit: Payer: Self-pay

## 2018-03-19 ENCOUNTER — Encounter: Payer: Self-pay | Admitting: Cardiology

## 2018-03-19 ENCOUNTER — Ambulatory Visit: Payer: 59 | Admitting: Cardiology

## 2018-03-19 VITALS — BP 120/76 | HR 81 | Ht 64.0 in | Wt 186.8 lb

## 2018-03-19 DIAGNOSIS — Z8249 Family history of ischemic heart disease and other diseases of the circulatory system: Secondary | ICD-10-CM | POA: Diagnosis not present

## 2018-03-19 DIAGNOSIS — E1065 Type 1 diabetes mellitus with hyperglycemia: Secondary | ICD-10-CM | POA: Diagnosis not present

## 2018-03-19 DIAGNOSIS — R000 Tachycardia, unspecified: Secondary | ICD-10-CM | POA: Insufficient documentation

## 2018-03-19 DIAGNOSIS — R0609 Other forms of dyspnea: Secondary | ICD-10-CM | POA: Diagnosis not present

## 2018-03-19 NOTE — Patient Instructions (Addendum)
Medication Instructions:  Not needed If you need a refill on your cardiac medications before your next appointment, please call your pharmacy.   Lab work: Not needed If you have labs (blood work) drawn today and your tests are completely normal, you will receive your results only by: Marland Kitchen MyChart Message (if you have MyChart) OR . A paper copy in the mail If you have any lab test that is abnormal or we need to change your treatment, we will call you to review the results.  Testing/Procedures:  WILL BE SCHEDULE at [place] has recommended that you have a cardiopulmonary stress test (CPX). CPX testing is a non-invasive measurement of heart and lung function. It replaces a traditional treadmill stress test. This type of test provides a tremendous amount of information that relates not only to your present condition but also for future outcomes. This test combines measurements of you ventilation, respiratory gas exchange in the lungs, electrocardiogram (EKG), blood pressure and physical response before, during, and following an exercise protocol.   Follow-Up: At [facility], you and your health needs are our priority.  As part of our continuing mission to provide you with exceptional heart care, we have created designated Provider Care Teams.  These Care Teams include your primary Cardiologist (physician) and Advanced Practice Providers (APPs -  Physician Assistants and Nurse Practitioners) who all work together to provide you with the care you need, when you need it. . Your physician recommends that you schedule a follow-up appointment in [place]. .   Any Other Special Instructions Will Be Listed Below (If Applicable).

## 2018-03-19 NOTE — Progress Notes (Signed)
PCP: Briscoe Deutscher, DO  Clinic Note: Chief Complaint  Patient presents with  . New Patient (Initial Visit)    Skipping heartbeats, family history of CAD, DOE    HPI: Chelsea Mercado is a 58 y.o. female with longstanding history of diabetes mellitus, type 1who is being seen today for the evaluation of increased HR at the request of Chelsea Mercado, [place].  Chelsea Mercado was last seen on March 1 by Chelsea Coke, PA noting intermittent episodes where she felt that her heart rate would be "out of her chest "lasting 1-2 beats.  These episodes catch her off guard and can happen with activity or with rest.  Symptoms not associated with chest pain, dyspnea, edema or dizziness. --She had previously not been drinking caffeinated beverages, but recently restarted drinking about a cup of coffee in the morning and occasional cup of tea in the afternoon.  Episodes of palpitations associated with that. --Probably as result of this, she began discussing concerns about her family history.  Her mother just died in Mar 01, 2022 after complications.  She was status post CABG at age 67.  --> Maternal uncle had CAD with his first MI at age 34 and maternal grandfather also had an MI.  Recent Hospitalizations: None  Studies Personally Reviewed - (if available, images/films reviewed: From Epic Chart or Care Everywhere)  None  Interval History: Chelsea Mercado is here today partly because the area for irregular heartbeat episodes but this is short-lived.  Usually her heart rates in the 84 bpm range.  She is a bit low but concerned that that will be high for her usually.  She has these short spurts lasting 1 or 2 beats but no other associated symptoms.  She notes that she has been trying to get back into her exercise regimen and is noticing some exertional dyspnea, but does not really note the palpitations while exerting herself at the gym. She has been under quite a bit of stress and is not sure the palpitations are related to  restarting caffeine, stress or anything to be concerned about.  Not associated with any lightheadedness, dizziness or syncope/near syncope. Although she is having a little exertional dyspnea she is not having any symptoms at all of chest pain or pressure with rest or exertion.  No PND, orthopnea or edema.  No TIA/amaurosis fugax symptoms. No melena, hematochezia, hematuria, or epstaxis. No claudication.  ROS: A comprehensive was performed. Review of Systems  Constitutional: Negative for malaise/fatigue and weight loss.  Musculoskeletal: Negative for falls and joint pain.  Neurological: Negative for dizziness, focal weakness and headaches.  Psychiatric/Behavioral: The patient is nervous/anxious.   All other systems reviewed and are negative.    I have reviewed and (if needed) personally updated the patient's problem list, medications, allergies, past medical and surgical history, social and family history.   Past Medical History:  Diagnosis Date  . Anemia   . Arthritis   . Controlled type 1 diabetes mellitus without complication ([place]) 14/43/1540  . Hyperlipidemia 06/22/2013  . Obesity, Class I, BMI 30-34.9 06/22/2013  . Post menopausal syndrome 06/22/2013  . Primary hypothyroidism 06/22/2013  . Vitamin D deficiency 09/24/2013    Past Surgical History:  Procedure Laterality Date  . APPENDECTOMY    . CESAREAN SECTION  1985    Current Meds  Medication Sig  . Cholecalciferol (VITAMIN D3) 5000 units CAPS Take by mouth.  . CONTOUR NEXT TEST test strip TEST 6-7 TIMES PER DAY  . fluticasone (FLONASE) 50 MCG/ACT nasal spray Place  2 sprays into both nostrils daily.  Marland Kitchen glucagon (GLUCAGON EMERGENCY) 1 MG injection Inject 1 mg into the muscle once as needed.  . insulin lispro (HUMALOG) 100 UNIT/ML injection USE AS DIRECTED PER PUMP TAKE MAXIMUM 50 UNITS DAILY  . Insulin Pen Needle (PEN NEEDLES) 32G X 4 MM MISC 1 each by Does not apply route daily.  . Ketoprofen 10 % CREA by Does not  apply route.  Marland Kitchen levothyroxine (SYNTHROID, LEVOTHROID) 125 MCG tablet TAKE 1 TABLET BY MOUTH EVERY DAY  . naproxen (NAPROSYN) 500 MG tablet Take 1 tablet (500 mg total) by mouth 2 (two) times daily with a meal.  . NON FORMULARY [facility]  Anti-Inflammatory Cream- Diclofenac 3%, Baclofen 2%, Lidocaine 2% Apply 1-2 grams to affected area 3-4 times daily Qty. 120 gm 3 refills  . pravastatin (PRAVACHOL) 40 MG tablet TAKE 1 TABLET BY MOUTH EVERY DAY  . ramipril (ALTACE) 10 MG capsule TAKE 1 CAPSULE BY MOUTH EVERY DAY    Allergies  Allergen Reactions  . Hydrocodone-Acetaminophen Nausea And Vomiting  . Propoxyphene Nausea Only    Social History   Tobacco Use  . Smoking status: Never Smoker  . Smokeless tobacco: Never Used  Substance Use Topics  . Alcohol use: No  . Drug use: No   Social History   Social History Narrative   Married mother of 3, grandmother 28.  Lives with her husband son and grandson.   Occasionally does yard work, but really never gets into any routine exercise.  Hoping to get into with exercise regimen at the gym.    family history includes CAD (age of onset: 2) in her maternal uncle; Cancer in her paternal grandfather; Coronary artery disease (age of onset: 26) in her maternal uncle; Coronary artery disease (age of onset: 3) in her mother; Heart attack in her maternal grandfather; High Cholesterol in her mother; Hyperlipidemia in her sister; Hypertension in her mother and sister; Hypothyroidism in her sister and sister; Lung cancer in her maternal uncle; Tuberculosis in her father and paternal grandmother.  Wt Readings from Last 3 Encounters:  03/19/18 186 lb 12.8 oz (84.7 kg)  03/13/18 186 lb (84.4 kg)  12/09/17 185 lb (83.9 kg)    PHYSICAL EXAM BP 120/76   Pulse 81   Ht 5\' 4"  (1.626 m)   Wt 186 lb 12.8 oz (84.7 kg)   SpO2 98%   BMI 32.06 kg/m  Physical Exam  Constitutional: She is oriented to person, place, and time. She appears well-developed  and well-nourished. No distress.  Healthy-appearing  HENT:  Head: Normocephalic and atraumatic.  Mouth/Throat: Oropharynx is clear and moist.  Eyes: Pupils are equal, round, and reactive to light. Conjunctivae are normal. No scleral icterus.  Neck: Normal range of motion. Neck supple. No hepatojugular reflux and no JVD present. Carotid bruit is not present. No tracheal deviation present.  Cardiovascular: Normal rate, regular rhythm, normal heart sounds and normal pulses.  No extrasystoles are present. PMI is not displaced. Exam reveals no gallop and no friction rub.  No murmur heard. Pulmonary/Chest: Effort normal and breath sounds normal. No respiratory distress. She has no wheezes. She has no rales.  Abdominal: Soft. Bowel sounds are normal. She exhibits no distension. There is no abdominal tenderness. There is no rebound.  Musculoskeletal: Normal range of motion.        General: No edema.  Lymphadenopathy:    She has no cervical adenopathy.  Neurological: She is alert and oriented to person, place, and time.  No cranial nerve deficit.  Skin: Skin is warm and dry.  Psychiatric: She has a normal mood and affect. Her behavior is normal. Judgment and thought content normal.  Vitals reviewed.    Adult ECG Report Not checked here today -EKG from PCP 03/13/2018: Sinus rhythm, rate 83 bpm.  Otherwise normal axis, intervals durations.  Normal EKG.  Other studies Reviewed: Additional studies/ records that were reviewed today include:  Recent Labs:   Lab Results  Component Value Date   CREATININE 0.91 03/13/2018   BUN 18 03/13/2018   NA 136 03/13/2018   K 4.9 03/13/2018   CL 99 03/13/2018   CO2 32 03/13/2018   Lab Results  Component Value Date   CHOL 183 09/23/2017   HDL 63.40 09/23/2017   LDLCALC 104 (H) 09/23/2017   TRIG 79.0 09/23/2017   CHOLHDL 3 09/23/2017    ASSESSMENT / PLAN: Problem List Items Addressed This Visit    DOE (dyspnea on exertion) - Primary    Probably related  to deconditioning, however we will evaluate with cardiopulmonary exercise test.      Relevant Orders   Cardiopulmonary exercise test   Family history of premature CAD (Chronic)    She is concerned based on her family history with her mother grandmother uncle having cardiac history and her having diabetes type 1.  She is a little bit short of breath which is probably related to being deconditioned, but prior to starting an exercise program would like to have some type of cuff confirmation that she is okay. We discussed screening options either coronary calcium scoring versus stress testing.  I think perhaps cardiopulmonary exercise test would be the best method of physiologic evaluation.  Plan: CPX      Relevant Orders   Cardiopulmonary exercise test   Rapid heart beat    What she is describing does not sound like rapid heartbeats that sounds more like palpitations which are probably PACs or PVCs.  Based on the relative infrequent nature of these symptoms I think they are probably benign.  If they do occur more frequently, we can consider an event monitor, however she was not concerned enough at this point to wear a monitor.      Relevant Orders   Cardiopulmonary exercise test   Type 1 diabetes mellitus with hyperglycemia (HCC) (Chronic)      I spent a total of 35.  Minutes with the patient and chart review. >  50% of the time was spent in direct patient consultation.   Current medicines are reviewed at length with the patient today.  (+/- concerns) n/a The following changes have been made:  n/a  Patient Instructions  Medication Instructions:  Not needed If you need a refill on your cardiac medications before your next appointment, please call your pharmacy.   Lab work: Not needed If you have labs (blood work) drawn today and your tests are completely normal, you will receive your results only by: Marland Kitchen MyChart Message (if you have MyChart) OR . A paper copy in the mail If you  have any lab test that is abnormal or we need to change your treatment, we will call you to review the results.  Testing/Procedures:  WILL BE SCHEDULE at [place] has recommended that you have a cardiopulmonary stress test (CPX). CPX testing is a non-invasive measurement of heart and lung function. It replaces a traditional treadmill stress test. This type of test provides a tremendous amount of information that relates not  only to your present condition but also for future outcomes. This test combines measurements of you ventilation, respiratory gas exchange in the lungs, electrocardiogram (EKG), blood pressure and physical response before, during, and following an exercise protocol.   Follow-Up: At [facility], you and your health needs are our priority.  As part of our continuing mission to provide you with exceptional heart care, we have created designated Provider Care Teams.  These Care Teams include your primary Cardiologist (physician) and Advanced Practice Providers (APPs -  Physician Assistants and Nurse Practitioners) who all work together to provide you with the care you need, when you need it. . Your physician recommends that you schedule a follow-up appointment in [place]. .   Any Other Special Instructions Will Be Listed Below (If Applicable).    Studies Ordered:   Orders Placed This Encounter  Procedures  . Cardiopulmonary exercise test      Glenetta Hew, M.D., M.S. Interventional Cardiologist   Pager # [phone] Phone # [phone] [address] [place], [place] [address]   Thank you for choosing Heartcare at [facility]!!

## 2018-03-21 ENCOUNTER — Other Ambulatory Visit: Payer: Self-pay

## 2018-03-21 ENCOUNTER — Ambulatory Visit
Admission: RE | Admit: 2018-03-21 | Discharge: 2018-03-21 | Disposition: A | Discharge status: Home / Self Care | Payer: 59 | Source: Ambulatory Visit | Attending: Family Medicine | Admitting: Family Medicine

## 2018-03-21 DIAGNOSIS — Z1231 Encounter for screening mammogram for malignant neoplasm of breast: Secondary | ICD-10-CM

## 2018-03-24 ENCOUNTER — Encounter: Payer: Self-pay | Admitting: Cardiology

## 2018-03-24 ENCOUNTER — Other Ambulatory Visit: Payer: Self-pay | Admitting: Family Medicine

## 2018-03-24 DIAGNOSIS — R928 Other abnormal and inconclusive findings on diagnostic imaging of breast: Secondary | ICD-10-CM

## 2018-03-24 NOTE — Assessment & Plan Note (Signed)
She is concerned based on her family history with her mother grandmother uncle having cardiac history and her having diabetes type 1.  She is a little bit short of breath which is probably related to being deconditioned, but prior to starting an exercise program would like to have some type of cuff confirmation that she is okay. We discussed screening options either coronary calcium scoring versus stress testing.  I think perhaps cardiopulmonary exercise test would be the best method of physiologic evaluation.  Plan: CPX

## 2018-03-24 NOTE — Assessment & Plan Note (Signed)
What she is describing does not sound like rapid heartbeats that sounds more like palpitations which are probably PACs or PVCs.  Based on the relative infrequent nature of these symptoms I think they are probably benign.  If they do occur more frequently, we can consider an event monitor, however she was not concerned enough at this point to wear a monitor.

## 2018-03-24 NOTE — Assessment & Plan Note (Signed)
Probably related to deconditioning, however we will evaluate with cardiopulmonary exercise test.

## 2018-03-27 ENCOUNTER — Other Ambulatory Visit: Payer: Self-pay | Admitting: Family Medicine

## 2018-03-27 ENCOUNTER — Ambulatory Visit
Admission: RE | Admit: 2018-03-27 | Discharge: 2018-03-27 | Disposition: A | Discharge status: Home / Self Care | Payer: 59 | Source: Ambulatory Visit | Attending: Family Medicine | Admitting: Family Medicine

## 2018-03-27 ENCOUNTER — Other Ambulatory Visit: Payer: Self-pay

## 2018-03-27 ENCOUNTER — Other Ambulatory Visit: Payer: Self-pay | Admitting: Internal Medicine

## 2018-03-27 DIAGNOSIS — R928 Other abnormal and inconclusive findings on diagnostic imaging of breast: Secondary | ICD-10-CM | POA: Diagnosis not present

## 2018-03-28 ENCOUNTER — Ambulatory Visit
Admission: RE | Admit: 2018-03-28 | Discharge: 2018-03-28 | Disposition: A | Discharge status: Home / Self Care | Payer: 59 | Source: Ambulatory Visit | Attending: Family Medicine | Admitting: Family Medicine

## 2018-03-28 DIAGNOSIS — N6012 Diffuse cystic mastopathy of left breast: Secondary | ICD-10-CM | POA: Diagnosis not present

## 2018-03-28 DIAGNOSIS — R928 Other abnormal and inconclusive findings on diagnostic imaging of breast: Secondary | ICD-10-CM | POA: Diagnosis not present

## 2018-03-28 DIAGNOSIS — E1065 Type 1 diabetes mellitus with hyperglycemia: Secondary | ICD-10-CM | POA: Diagnosis not present

## 2018-04-04 ENCOUNTER — Encounter: Payer: Self-pay | Admitting: Family Medicine

## 2018-04-06 NOTE — Telephone Encounter (Signed)
Called patient n/a no v/m

## 2018-04-07 ENCOUNTER — Ambulatory Visit: Payer: 59 | Admitting: Family Medicine

## 2018-04-08 ENCOUNTER — Encounter: Payer: Self-pay | Admitting: Internal Medicine

## 2018-04-08 NOTE — Telephone Encounter (Signed)
Called patient she has breast center mailing copy.

## 2018-04-09 ENCOUNTER — Telehealth: Payer: Self-pay | Admitting: Physician Assistant

## 2018-04-09 NOTE — Telephone Encounter (Signed)
Please call patient and let her know that I would like for her to do a Webex to check in on her mood since our last visit. I've been thinking about her! Can schedule with Juleen China (first) or me.  Thanks! Aldona Bar

## 2018-04-10 NOTE — Progress Notes (Signed)
Virtual Visit via Video   I connected with Chelsea Mercado on 04/12/18 at  1:40 PM EDT by a video enabled telemedicine application and verified that I am speaking with the correct person using two identifiers. Location patient: Home Location provider: [facility], Office Persons participating in the virtual visit: Chelsea Mercado, Chelsea Deutscher, DO Chelsea Mercado, CMA acting as scribe for Dr. Briscoe Mercado.     I discussed the limitations of evaluation and management by telemedicine and the availability of in person appointments. The patient expressed understanding and agreed to proceed.  Subjective:   HPI: Patient in for follow up on depression. She was able to see endocrinology today. She has increased her exercise and has improvement on her numbers.  She was doing some weight training but she had to stop doing that due to breast biopsy. She is feeling better from with that now. She has follow up with surgeon about options soon.    She feels like she is having improvement with her rapid heart rate. She feels like the walking has improved. She is seeing big improvement with distance that she can walk with out stopping. Never received call re: exercise stress test but feels that she is okay to hold now that she is becoming more conditioned.   Chelsea Mercado is improving with his depression and that helps some with her stress.   Reviewed all precautions and expectations with prevention of Covid-19.   ROS: See pertinent positives and negatives per HPI.  Patient Active Problem List   Diagnosis Date Noted  . Mixed anxiety and depressive disorder 04/12/2018  . Family history of premature CAD 03/19/2018  . DOE (dyspnea on exertion) 03/19/2018  . Rapid heart beat 03/19/2018  . Achilles tendon contracture, bilateral 05/07/2017  . Left thyroid nodule 10/01/2016  . Varicose veins of bilateral lower extremities with other complications 95/18/8416  . Vitamin D deficiency 09/24/2013  . Type 1 diabetes  mellitus with hyperglycemia ([place]) 06/22/2013  . Mixed diabetic hyperlipidemia associated with type 1 diabetes mellitus ([place]) 06/22/2013  . Obesity, Class I, BMI 30-34.9 06/22/2013  . Primary hypothyroidism 06/22/2013    Social History   Tobacco Use  . Smoking status: Never Smoker  . Smokeless tobacco: Never Used  Substance Use Topics  . Alcohol use: No    Current Outpatient Medications:  .  Cholecalciferol (VITAMIN D3) 5000 units CAPS, Take by mouth., Disp: , Rfl:  .  CONTOUR NEXT TEST test strip, TEST 6-7 TIMES PER DAY, Disp: 200 each, Rfl: 5 .  fluticasone (FLONASE) 50 MCG/ACT nasal spray, Place 2 sprays into both nostrils daily., Disp: 16 g, Rfl: 6 .  glucagon (GLUCAGON EMERGENCY) 1 MG injection, Inject 1 mg into the muscle once as needed., Disp: 1 each, Rfl: 12 .  insulin lispro (HUMALOG) 100 UNIT/ML injection, USE AS DIRECTED PER PUMP TAKE MAXIMUM 50 UNITS DAILY, Disp: 60 mL, Rfl: 1 .  Insulin Pen Needle (PEN NEEDLES) 32G X 4 MM MISC, 1 each by Does not apply route daily., Disp: 100 each, Rfl: 11 .  Ketoprofen 10 % CREA, by Does not apply route., Disp: , Rfl:  .  levothyroxine (SYNTHROID, LEVOTHROID) 125 MCG tablet, TAKE 1 TABLET BY MOUTH EVERY DAY, Disp: 90 tablet, Rfl: 3 .  naproxen (NAPROSYN) 500 MG tablet, Take 1 tablet (500 mg total) by mouth 2 (two) times daily with a meal., Disp: 60 tablet, Rfl: 1 .  NON FORMULARY, [facility]  Anti-Inflammatory Cream- Diclofenac 3%, Baclofen 2%, Lidocaine 2% Apply 1-2  grams to affected area 3-4 times daily Qty. 120 gm 3 refills, Disp: , Rfl:  .  pravastatin (PRAVACHOL) 40 MG tablet, TAKE 1 TABLET BY MOUTH EVERY DAY, Disp: 90 tablet, Rfl: 3 .  ramipril (ALTACE) 10 MG capsule, TAKE 1 CAPSULE BY MOUTH EVERY DAY, Disp: 90 capsule, Rfl: 3  Allergies  Allergen Reactions  . Hydrocodone-Acetaminophen Nausea And Vomiting  . Propoxyphene Nausea Only    Objective:   VITALS: Per patient if applicable, see vitals. GENERAL: Alert, appears  well and in no acute distress. HEENT: Atraumatic, conjunctiva clear, no obvious abnormalities on inspection of external nose and ears. NECK: Normal movements of the head and neck. CARDIOPULMONARY: No increased WOB. Speaking in clear sentences. I:E ratio WNL.  MS: Moves all visible extremities without noticeable abnormality. PSYCH: Pleasant and cooperative, well-groomed. Speech normal rate and rhythm. Affect is appropriate. Insight and judgement are appropriate. Attention is focused, linear, and appropriate.  NEURO: CN grossly intact. Oriented as arrived to appointment on time with no prompting. Moves both UE equally.  SKIN: No obvious lesions, wounds, erythema, or cyanosis noted on face or hands.  Assessment and Plan:   Chelsea Mercado was seen today for follow-up.  Diagnoses and all orders for this visit:  DOE (dyspnea on exertion) Comments: Deconditioning improving with gradual increase in walking. Continue.   Rapid heart beat Comments: Improved. No CP, SOB at rest, edema.   Type 1 diabetes mellitus with hyperglycemia (HCC) Comments: Improving with walking. Reviewed Endo note from today.  Obesity, Class I, BMI 30-34.9 Comments: Continue walking.   Mixed anxiety and depressive disorder Comments: Improving with the walking.    . Reviewed expectations re: course of current medical issues. . Discussed self-management of symptoms. . Outlined signs and symptoms indicating need for more acute intervention. . Patient verbalized understanding and all questions were answered. Marland Kitchen Health Maintenance issues including appropriate healthy diet, exercise, and smoking avoidance were discussed with patient. . See orders for this visit as documented in the electronic medical record.  Chelsea Deutscher, DO 04/12/2018

## 2018-04-11 ENCOUNTER — Encounter: Payer: Self-pay | Admitting: Internal Medicine

## 2018-04-11 ENCOUNTER — Ambulatory Visit (INDEPENDENT_AMBULATORY_CARE_PROVIDER_SITE_OTHER): Payer: 59 | Admitting: Internal Medicine

## 2018-04-11 ENCOUNTER — Other Ambulatory Visit: Payer: Self-pay

## 2018-04-11 ENCOUNTER — Ambulatory Visit (INDEPENDENT_AMBULATORY_CARE_PROVIDER_SITE_OTHER): Payer: 59 | Admitting: Family Medicine

## 2018-04-11 ENCOUNTER — Encounter: Payer: Self-pay | Admitting: Family Medicine

## 2018-04-11 VITALS — Ht 64.0 in | Wt 186.0 lb

## 2018-04-11 DIAGNOSIS — R0609 Other forms of dyspnea: Secondary | ICD-10-CM

## 2018-04-11 DIAGNOSIS — E041 Nontoxic single thyroid nodule: Secondary | ICD-10-CM

## 2018-04-11 DIAGNOSIS — R000 Tachycardia, unspecified: Secondary | ICD-10-CM | POA: Diagnosis not present

## 2018-04-11 DIAGNOSIS — E1065 Type 1 diabetes mellitus with hyperglycemia: Secondary | ICD-10-CM

## 2018-04-11 DIAGNOSIS — F418 Other specified anxiety disorders: Secondary | ICD-10-CM

## 2018-04-11 DIAGNOSIS — E039 Hypothyroidism, unspecified: Secondary | ICD-10-CM | POA: Diagnosis not present

## 2018-04-11 DIAGNOSIS — E559 Vitamin D deficiency, unspecified: Secondary | ICD-10-CM

## 2018-04-11 DIAGNOSIS — E669 Obesity, unspecified: Secondary | ICD-10-CM

## 2018-04-11 MED ORDER — GLUCAGON (RDNA) 1 MG IJ KIT: 1.0000 mg | PACK | Freq: Once | INTRAMUSCULAR | 12 refills | Status: DC | PRN

## 2018-04-11 NOTE — Patient Instructions (Addendum)
Please continue: - basal rates: 12 AM: 0.775 units/h 5 AM: 0.950 9 AM: 0.775  - ICR: 1:10  - target: 100-120 - ISF: 40 - Insulin on Board: 3 hours  Please enter the carbs for snacks and desserts.  Enter 5-10 g carbs for coffee.  If you exercise, decrease the basal rate to 50% for the duration of exercise and 1 hour afterwards.  Please continue levothyroxine 125 mcg daily.  Take the thyroid hormone every day, with water, at least 30 minutes before breakfast, separated by at least 4 hours from: - acid reflux medications - calcium - iron - multivitamins  Please return in 3-4 months.

## 2018-04-11 NOTE — Progress Notes (Signed)
Patient ID: Chelsea Mercado, female   DOB: 05/04/1960, 57 y.o.   MRN: [id number]   Patient location: Home My location: Office  Referring Provider: Wallace, Erica, DO  I connected with the patient on 04/11/18 at 10:00 AM EDT by a video enabled telemedicine application and verified that I am speaking with the correct person.   I discussed the limitations of evaluation and management by telemedicine and the availability of in person appointments. The patient expressed understanding and agreed to proceed.   Details of the encounter are shown below.  HPI: Chelsea Mercado is a 57 y.o.-year-old female, returning for f/u for DM1, dx'ed at 58 y/o, uncontrolled, without long term complications and hypothyroidism. She saw endocrinology before: Dr. Sfeir.  Last visit with me 4 months ago.  At last visit, her sugars were higher, as she was stressed due to mother just having been diagnosed with Merkel Cell CA - stage 4. She passed away 01/2018.  Last hemoglobin A1c was: Lab Results  Component Value Date   HGBA1C 8.0 (A) 12/09/2017   HGBA1C 7.9 (A) 08/06/2017   HGBA1C 8.2% 04/05/2017  02/14/2016: HbA1c 7.6%  She continues on an insulin pump: Medtronic 670 G warm on her arm -started 2016-2017, but the new pump is from 01/2017.  She is not wearing the CGM since she does not have enough space on the abdomen to attach it.   Uses Humalog in the pump.  Pump settings: - basal rates: 12 AM: 0.775 units/h 5 AM: 0.950 9 AM: 0.775  - ICR: 1:10  - target: 100-120 - ISF: 40 - Insulin on Board: 3 hours Please enter the carbs for snacks and desserts Enter 5-10 g carbs for coffee. If you exercise, decrease the basal rate to 50% for the duration of exercise and 1 hour afterwards. - bolus wizard: on TDD from basal insulin: 59% (18 units) >> 58% >> 51% >> ? TDD from bolus insulin: 41% (12 units) >> 42% >> 41% >> ? Total daily dose: up to 50 units - changes infusion site: q 3 days  - Meter: Bayer Contour  Pt  checks her sugars 4x a day: - am:  66-229 >> 68, 88-219, 340 (site pb) >> 82-173 - 2h after b'fast: 114 >> 67, 110-194, 259 >> n/c >> 67 after a walk - before lunch: 81-242, 285 >> 89-214 >> 75-105 - 2h after lunch: n/c  >> 219-253 >> n/c >> 182 - before dinner:  102-210, 274 >> 111-237, 251 >> 165, 201 (snack) - 2h after dinner: n/c >> 70-195 >> n/c - bedtime:  242 >> see above >> 151-257, 322 >> n/c - nighttime: 79 >> n/c >> 217 Lowest sugar was  66 >> 60s >> 67; she has hypoglycemia awareness in the 60s.  She had 1 previous ED visit for hypoglycemia many years ago.  She has a non-expired glucagon kit at home. Highest sugar was 285 >> 556 (site pbs), 340 (site pb) >> 254.  No previous DKA admissions.  Pt's meals are: - Breakfast: egg, bread, milk; milk + cereals; coffee - Lunch: Sandwich, soup, salad, fruit, chips; milk - Dinner: Meat, potatoes, brown rice, veggies or salad - Snacks: 1  She walks 2-3 x a day - up to 1h.  -No CKD, last BUN/creatinine:  Lab Results  Component Value Date   BUN 18 03/13/2018   BUN 23 09/23/2017   CREATININE 0.91 03/13/2018   CREATININE 0.92 09/23/2017   On ramipril 10. -+ HL. Last set of lipids: Lab   Results  Component Value Date   CHOL 183 09/23/2017   HDL 63.40 09/23/2017   LDLCALC 104 (H) 09/23/2017   TRIG 79.0 09/23/2017   CHOLHDL 3 09/23/2017   On pravastatin 40. - last eye exam was in 05/2017: No DR -She denies numbness and tingling in her feet.  Pt has no FH of DM.  Hypothyroidism:  Latest TSH was normal: Lab Results  Component Value Date   TSH 2.16 03/13/2018  02/10/2016: TSH 6.671 08/06/2015: TSH 4.35 03/26/2015: TSH 4.37 09/28/2014: TSH 2.73  Pt is on levothyroxine 125 mcg daily, taken: - in am - fasting - at least 30 min from b'fast - no Ca, Fe, MVI, PPIs - not on Biotin  Vitamin D deficiency:  Reviewed latest vitamin D level-normal: Lab Results  Component Value Date   VD25OH 47.47 10/01/2016   Prev: 02/10/2016: vit D 31 08/06/2015: vit D 21  She is on vitamin D 5000 units daily.  She will see Dr. Cornett for removal of a premalignant breast lesion.  She saw Dr. Harding for increased HR.   ROS: Constitutional: no weight gain/no weight loss, no fatigue, no subjective hyperthermia, no subjective hypothermia Eyes: no blurry vision, no xerophthalmia ENT: no sore throat, no nodules palpated in neck, no dysphagia, no odynophagia, no hoarseness Cardiovascular: no CP/no SOB/no palpitations/no leg swelling Respiratory: no cough/no SOB/no wheezing Gastrointestinal: no N/no V/no D/no C/no acid reflux Musculoskeletal: no muscle aches/no joint aches Skin: no rashes, no hair loss Neurological: no tremors/no numbness/no tingling/no dizziness  I reviewed pt's medications, allergies, PMH, social hx, family hx, and changes were documented in the history of present illness. Otherwise, unchanged from my initial visit note.  Past Medical History:  Diagnosis Date  . Anemia   . Arthritis   . Controlled type 1 diabetes mellitus without complication (HCC) 06/22/2013  . Hyperlipidemia 06/22/2013  . Obesity, Class I, BMI 30-34.9 06/22/2013  . Post menopausal syndrome 06/22/2013  . Primary hypothyroidism 06/22/2013  . Vitamin D deficiency 09/24/2013   Past Surgical History:  Procedure Laterality Date  . APPENDECTOMY    . CESAREAN SECTION  1985   Social History   Social History  . Marital status: Married    Spouse name: N/A  . Number of children: 3   Occupational History  . Homemaker    Social History Main Topics  . Smoking status: Never Smoker  . Smokeless tobacco: Never Used  . Alcohol use No  . Drug use: No   Current Outpatient Medications on File Prior to Visit  Medication Sig Dispense Refill  . Cholecalciferol (VITAMIN D3) 5000 units CAPS Take by mouth.    . CONTOUR NEXT TEST test strip TEST 6-7 TIMES PER DAY 200 each 5  . fluticasone (FLONASE) 50 MCG/ACT nasal spray  Place 2 sprays into both nostrils daily. 16 g 6  . glucagon (GLUCAGON EMERGENCY) 1 MG injection Inject 1 mg into the muscle once as needed. 1 each 12  . insulin lispro (HUMALOG) 100 UNIT/ML injection USE AS DIRECTED PER PUMP TAKE MAXIMUM 50 UNITS DAILY 60 mL 1  . Insulin Pen Needle (PEN NEEDLES) 32G X 4 MM MISC 1 each by Does not apply route daily. 100 each 11  . Ketoprofen 10 % CREA by Does not apply route.    . levothyroxine (SYNTHROID, LEVOTHROID) 125 MCG tablet TAKE 1 TABLET BY MOUTH EVERY DAY 90 tablet 3  . naproxen (NAPROSYN) 500 MG tablet Take 1 tablet (500 mg total) by mouth 2 (two) times daily   with a meal. 60 tablet 1  . NON FORMULARY [facility]  Anti-Inflammatory Cream- Diclofenac 3%, Baclofen 2%, Lidocaine 2% Apply 1-2 grams to affected area 3-4 times daily Qty. 120 gm 3 refills    . pravastatin (PRAVACHOL) 40 MG tablet TAKE 1 TABLET BY MOUTH EVERY DAY 90 tablet 3  . ramipril (ALTACE) 10 MG capsule TAKE 1 CAPSULE BY MOUTH EVERY DAY 90 capsule 3   No current facility-administered medications on file prior to visit.    Allergies  Allergen Reactions  . Hydrocodone-Acetaminophen Nausea And Vomiting  . Propoxyphene Nausea Only   Family History  Problem Relation Age of Onset  . Hypertension Mother   . High Cholesterol Mother   . Coronary artery disease Mother 79       CABG  . Tuberculosis Father   . Heart attack Maternal Grandfather        Died from MI  . Coronary artery disease Maternal Uncle 75       CABG  . Lung cancer Maternal Uncle        asbestosis  . CAD Maternal Uncle 35       died - coronary aneurysm  . Hyperlipidemia Sister   . Hypertension Sister   . Hypothyroidism Sister   . Tuberculosis Paternal Grandmother   . Cancer Paternal Grandfather   . Hypothyroidism Sister    PE: There were no vitals taken for this visit. Wt Readings from Last 3 Encounters:  03/19/18 186 lb 12.8 oz (84.7 kg)  03/13/18 186 lb (84.4 kg)  12/09/17 185 lb (83.9 kg)    Constitutional:  in NAD  The physical exam was not performed (virtual visit).  ASSESSMENT: 1. DM1, uncontrolled, without long term complications, but with hyperglycemia  2. Hypothyroidism  3. Thyroid nodule  4. Vit D def  PLAN:  1. Patient with longstanding, uncontrolled, type 1 diabetes, on insulin pump.  He is usually doing a good job entering her carbs in the pump and bolusing for meals, however, at last visit, HbA1c was higher as she was stressed due to her mom's illness.  At that time, she was not entering all carbs especially with snacks and also was not bolusing for coffee so we discussed about doing so.  She tried a Mediterranean diet in the past and she noticed that she needed a higher insulin to carb ratio when eating this way.  She also tried a vegan diet in the past but could not do it.  She had problems with her insulin sites in the past and she is now using the Medtronic pump on her arm.  At last visit she also had variability in her sugars without a clear pattern.  There was a possible trend towards increasing blood sugars throughout the day so we discussed about maybe trying a lower insulin to carb ratio if the sugars did not improve.  At that time, she was getting calories from fatty foods (like cheese or milk) which were delaying the peak in her blood sugars.  She was exercising and we discussed about using 50% basal rate during exercise and probably an hour after to prevent low blood sugars after exercise. -At this visit, she does a good job changing her site every 3 hours, bolusing with all of her meals and snacks, bolusing with coffee, starting the boluses 15 minutes before meals. -At this visit, her sugars are much better, as her stress has improved.  She is also doing a great job walking after almost each of   her main meals and she noticed a significant decrease in blood sugars.  Occasionally, she drops her sugars in the 60s after exercise but upon questioning, she is not  decreasing her basal rate while exercising.  I recommended that she start by decreasing the basal rate by 50% during the time of the exercise and possibly even an hour afterwards to reduce the incidence of post exertional hypoglycemia.  We also discussed about correcting this mild lows.  As of now, she occasionally overcompensates with carbs and we discussed about how to better correct her lows.  We also discussed that when she exercises after breakfast, she may try to skip the insulin for coffee. -Otherwise, I do not feel she needs any changes in her regimen. - I advised her to:  Patient Instructions  Please continue: - basal rates: 12 AM: 0.775 units/h 5 AM: 0.950 9 AM: 0.775  - ICR: 1:10  - target: 100-120 - ISF: 40 - Insulin on Board: 3 hours  Please enter the carbs for snacks and desserts.  Enter 5-10 g carbs for coffee.  If you exercise, decrease the basal rate to 50% for the duration of exercise and 1 hour afterwards.  Please continue levothyroxine 125 mcg daily.  Take the thyroid hormone every day, with water, at least 30 minutes before breakfast, separated by at least 4 hours from: - acid reflux medications - calcium - iron - multivitamins  Please return in 3-4 months.   - we will check her HbA1c when she returns to the clinic - continue checking sugars at different times of the day - check >4x a day, rotating checks - advised for yearly eye exams >> she is UTD - Return to clinic in 3-4 mo with sugar log       2.  Hypothyroidism  - latest thyroid labs reviewed with pt >> normal 03/2018 - she continues on LT4 125 mcg daily - pt feels good on this dose. - we discussed about taking the thyroid hormone every day, with water, >30 minutes before breakfast, separated by >4 hours from acid reflux medications, calcium, iron, multivitamins. Pt. is taking it correctly.  3. H/o Thyroid nodule -Denies neck compression symptoms -No nodules evident on the latest thyroid  ultrasound  4.Vit D def -Continues on 5000 units vitamin D daily -Latest vitamin D level was normal in 09/2016 -We will repeat her level when she returns to the clinic  [name], MD PhD [facility] Endocrinology

## 2018-04-12 ENCOUNTER — Encounter: Payer: Self-pay | Admitting: Family Medicine

## 2018-04-12 DIAGNOSIS — F418 Other specified anxiety disorders: Secondary | ICD-10-CM | POA: Insufficient documentation

## 2018-04-20 ENCOUNTER — Other Ambulatory Visit: Payer: Self-pay | Admitting: Internal Medicine

## 2018-04-24 ENCOUNTER — Ambulatory Visit: Payer: 59 | Admitting: Cardiology

## 2018-04-25 ENCOUNTER — Ambulatory Visit: Payer: Self-pay | Admitting: Surgery

## 2018-04-25 DIAGNOSIS — D242 Benign neoplasm of left breast: Secondary | ICD-10-CM | POA: Diagnosis not present

## 2018-04-25 NOTE — H&P (Signed)
Chelsea Mercado Documented: 04/25/2018 9:23 AM Location: [facility] Patient #: [id number] DOB: November 15, 1960 Married / Language: [name] / Race: White Female  History of Present Illness Chelsea Moores A. Fontaine Kossman MD; 04/25/2018 10:00 AM) Patient words: CLINICAL DATA: 58 year old female recalled from screening mammogram dated  EXAM: DIGITAL DIAGNOSTIC LEFT MAMMOGRAM WITH CAD  COMPARISON: Previous exam(s).  ACR Breast Density Category c: The breast tissue is heterogeneously dense, which may obscure small masses.  FINDINGS: Grouped punctate calcifications spanning approximately 1 cm demonstrated in the subareolar left breast at anterior depth. Today's magnification views demonstrate another 3 mm group of calcifications just behind the nipple. This second group is likely seen on prior mammograms dating back to at least 2015.  Mammographic images were processed with CAD.  IMPRESSION: 1. Indeterminate left breast calcifications in the subareolar aspect spanning approximately 1 cm. Recommendation is for definitive tissue diagnosis with stereotactic biopsy. 2. Additional group of 3 mm calcifications located just below the skin along the left nipple. These may be seen on prior mammograms dating back to 2014. However, they would not be amenable to stereotactic biopsy given the location.  RECOMMENDATION: 1. Stereotactic biopsy is recommended for the larger 1 cm group of calcifications at anterior to middle depth. 2. If above biopsy demonstrates benign pathology, six-month follow-up is recommended for the additional 3 mm group of calcifications just behind the nipple. If this area demonstrates atypia or malignancy, additional excision of the 3 mm group of calcifications would be recommended at the time of surgery.  I have discussed the findings and recommendations with the patient. Results were also provided in writing at the conclusion of the visit. If applicable, a reminder letter  will be sent to the patient regarding the next appointment.  BI-RADS CATEGORY 4: Suspicious.   Electronically Signed By: Kristopher Oppenheim M.D. On: 03/27/2018 12:06     Pt presents at the request of Dr Jeanmarie Plant for left breast microcalcifications and bx that showed papilloma. No nipple discharge or mass Denies pain. Had some soreness and bruising around the biopsy site.                        Diagnosis Breast, left, needle core biopsy, retroareolar location - PAPILLARY LESION - USUAL DUCTAL HYPERPLASIA, FIBROCYSTIC CHANGES, AND FIBROADENOMATOID NODULE - CALCIFICATIONS - SEE COMMENT Microscopic Comment The papillary lesion is favored to be an intraductal papilloma.  The patient is a 58 year old female.   Past Surgical History Nance Pew, CMA; 04/25/2018 9:23 AM) Appendectomy Breast Biopsy Left. Cesarean Section - 1 Tonsillectomy  Diagnostic Studies History Nance Pew, CMA; 04/25/2018 9:23 AM) Colonoscopy 5-10 years ago Mammogram within last year Pap Smear 1-5 years ago  Allergies Nance Pew, CMA; 04/25/2018 9:25 AM) Vicodin ES *ANALGESICS - OPIOID* Allergies Reconciled  Medication History Nance Pew, CMA; 04/25/2018 9:25 AM) Contour Next Test (In Vitro) Active. HumaLOG (100UNIT/ML Solution, Subcutaneous) Active. Levothyroxine Sodium (125MCG Tablet, Oral) Active. Pravastatin Sodium (40MG  Tablet, Oral) Active. Ramipril (10MG  Capsule, Oral) Active. Medications Reconciled  Social History Nance Pew, CMA; 04/25/2018 9:23 AM) Alcohol use Occasional alcohol use. Caffeine use Carbonated beverages, Coffee, Tea. No drug use Tobacco use Never smoker.  Family History Nance Pew, [place]; 04/25/2018 9:23 AM) Anesthetic complications Mother, Sister. Colon Polyps Sister. Heart Disease Family Members In General, Mother. Hypertension Family Members In General, Mother, Sister. Ovarian Cancer Family Members In  General. Respiratory Condition Father. Thyroid problems Mother, Sister.  Pregnancy / Birth History Nance Pew, [place]; 04/25/2018 9:23 AM)  Age at menarche 45 years. Age of menopause 27-50 Gravida 3 Length (months) of breastfeeding 7-12 Maternal age 51-20 Para 3  Other Problems Nance Pew, CMA; 04/25/2018 9:23 AM) Arthritis Diabetes Mellitus General anesthesia - complications Hemorrhoids Lump In Breast Oophorectomy Left. Thyroid Disease     Review of Systems ([place]; 04/25/2018 9:23 AM) General Present- Weight Gain. Not Present- Appetite Loss, Chills, Fatigue, Fever, Night Sweats and Weight Loss. Skin Not Present- Change in Wart/Mole, Dryness, Hives, Jaundice, New Lesions, Non-Healing Wounds, Rash and Ulcer. HEENT Present- Wears glasses/contact lenses. Not Present- Earache, Hearing Loss, Hoarseness, Nose Bleed, Oral Ulcers, Ringing in the Ears, Seasonal Allergies, Sinus Pain, Sore Throat, Visual Disturbances and Yellow Eyes. Respiratory Present- Chronic Cough. Not Present- Bloody sputum, Difficulty Breathing, Snoring and Wheezing. Breast Not Present- Breast Mass, Breast Pain, Nipple Discharge and Skin Changes. Cardiovascular Present- Swelling of Extremities. Not Present- Chest Pain, Difficulty Breathing Lying Down, Leg Cramps, Palpitations, Rapid Heart Rate and Shortness of Breath. Gastrointestinal Present- Hemorrhoids. Not Present- Abdominal Pain, Bloating, Bloody Stool, Change in Bowel Habits, Chronic diarrhea, Constipation, Difficulty Swallowing, Excessive gas, Gets full quickly at meals, Indigestion, Nausea, Rectal Pain and Vomiting. Female Genitourinary Not Present- Frequency, Nocturia, Painful Urination, Pelvic Pain and Urgency. Musculoskeletal Not Present- Back Pain, Joint Pain, Joint Stiffness, Muscle Pain, Muscle Weakness and Swelling of Extremities. Neurological Not Present- Decreased Memory, Fainting, Headaches, Numbness, Seizures, Tingling,  Tremor, Trouble walking and Weakness. Psychiatric Not Present- Anxiety, Bipolar, Change in Sleep Pattern, Depression, Fearful and Frequent crying. Endocrine Not Present- Cold Intolerance, Excessive Hunger, Hair Changes, Heat Intolerance, Hot flashes and New Diabetes. Hematology Not Present- Blood Thinners, Easy Bruising, Excessive bleeding, Gland problems, HIV and Persistent Infections.  Vitals (Sabrina Canty CMA; 04/25/2018 9:26 AM) 04/25/2018 9:26 AM Weight: 187.8 lb Height: 64in Body Surface Area: 1.9 m Body Mass Index: 32.24 kg/m  Temp.: 97.82F(Temporal)  Pulse: 98 (Regular)  P.OX: 97% (Room air) BP: 138/72 (Sitting, Left Arm, Standard)      Physical Exam (Piera Downs A. Carlos Heber MD; 04/25/2018 9:58 AM)  General Mental Status-Alert. General Appearance-Consistent with stated age. Hydration-Well hydrated. Voice-Normal.  Head and Neck Head-normocephalic, atraumatic with no lesions or palpable masses. Trachea-midline. Thyroid Gland Characteristics - normal size and consistency.  Chest and Lung Exam Chest and lung exam reveals -quiet, even and easy respiratory effort with no use of accessory muscles and on auscultation, normal breath sounds, no adventitious sounds and normal vocal resonance. Inspection Chest Wall - Normal. Back - normal.  Breast Breast - Left-Symmetric, Non Tender, No Biopsy scars, no Dimpling, No Inflammation, No Lumpectomy scars, No Mastectomy scars, No Peau d' Orange. Breast - Right-Symmetric, Non Tender, No Biopsy scars, no Dimpling, No Inflammation, No Lumpectomy scars, No Mastectomy scars, No Peau d' Orange. Breast Lump-No Palpable Breast Mass.  Cardiovascular Cardiovascular examination reveals -normal heart sounds, regular rate and rhythm with no murmurs and normal pedal pulses bilaterally.  Neurologic Neurologic evaluation reveals -alert and oriented x 3 with no impairment of recent or remote memory. Mental  Status-Normal.  Musculoskeletal Normal Exam - Left-Upper Extremity Strength Normal and Lower Extremity Strength Normal. Normal Exam - Right-Upper Extremity Strength Normal and Lower Extremity Strength Normal.  Lymphatic Head & Neck  General Head & Neck Lymphatics: Bilateral - Description - Normal. Axillary  General Axillary Region: Bilateral - Description - Normal. Tenderness - Non Tender.    Assessment & Plan (Sheina Mcleish A. Luwanda Starr MD; 04/25/2018 9:56 AM)  PAPILLOMA OF LEFT BREAST (D24.2) Impression: Recommend left breast seed lumpectomy for possible low  risk malignancy versus occult disease. Discussed nonoperative management as well. Risk of lumpectomy include bleeding, infection, seroma, more surgery, use of seed/wire, wound care, cosmetic deformity and the need for other treatments, death , blood clots, death. Pt agrees to proceed.  Current Plans You are being scheduled for surgery- Our schedulers will call you.  You should hear from our office's scheduling department within 5 working days about the location, date, and time of surgery. We try to make accommodations for patient's preferences in scheduling surgery, but sometimes the OR schedule or the surgeon's schedule prevents [place] from making those accommodations.  If you have not heard from our office [phone]) in 5 working days, call the office and ask for your surgeon's nurse.  If you have other questions about your diagnosis, plan, or surgery, call the office and ask for your surgeon's nurse.  Pt Education - [facility] Breast Biopsy HCI: discussed with patient and provided information.

## 2018-05-13 ENCOUNTER — Telehealth (INDEPENDENT_AMBULATORY_CARE_PROVIDER_SITE_OTHER): Payer: 59 | Admitting: Family Medicine

## 2018-05-13 DIAGNOSIS — J329 Chronic sinusitis, unspecified: Secondary | ICD-10-CM

## 2018-05-13 MED ORDER — AMOXICILLIN-POT CLAVULANATE 875-125 MG PO TABS: 1.0000 | ORAL_TABLET | Freq: Two times a day (BID) | ORAL | 0 refills | Status: DC

## 2018-05-13 MED ORDER — AZELASTINE HCL 0.1 % NA SOLN: 2.0000 | Freq: Two times a day (BID) | NASAL | 12 refills | Status: DC

## 2018-05-13 NOTE — Progress Notes (Signed)
    Chief Complaint:  Francesa Eugenio is a 58 y.o. female who presents today for a virtual office visit with a chief complaint of sinus congestion.   Assessment/Plan:  Sinusitis No red flags.  Start Astelin.  Send in a "pocket prescription" for Augmentin with strict instruction not start unless symptoms worsen or do not improve the next few days.  Encouraged good oral hydration.  Continue over-the-counter medications as needed.  Discussed reasons to return to care.  Follow-up as needed.    Subjective:  HPI:  Sinus Congestion Started about a week ago.  Worsened within the last day.  Associated with some pain and pressure in her frontal area.  No fevers or chills.  No body aches.  No specific treatments tried.  Has a history of underlying allergies.  Has tried Flonase which has not helped.  No other obvious alleviating or aggravating factors.  Feels very similar to prior sinus infections.  ROS: Per HPI  PMH: She reports that she has never smoked. She has never used smokeless tobacco. She reports that she does not drink alcohol or use drugs.      Objective/Observations  Physical Exam: Gen: NAD, resting comfortably Pulm: Normal work of breathing Neuro: Grossly normal, moves all extremities Psych: Normal affect and thought content  Virtual Visit via Video   I connected with Baxter Hire on 05/13/18 at  2:40 PM EDT by a video enabled telemedicine application and verified that I am speaking with the correct person using two identifiers. I discussed the limitations of evaluation and management by telemedicine and the availability of in person appointments. The patient expressed understanding and agreed to proceed.   Patient location: Home Provider location: [place] participating in the virtual visit: Myself and Patient     Algis Greenhouse. Jerline Pain, MD 05/13/2018 2:35 PM

## 2018-05-21 ENCOUNTER — Other Ambulatory Visit: Payer: Self-pay | Admitting: Surgery

## 2018-05-21 DIAGNOSIS — D242 Benign neoplasm of left breast: Secondary | ICD-10-CM

## 2018-05-23 ENCOUNTER — Telehealth: Payer: Self-pay | Admitting: Internal Medicine

## 2018-05-23 NOTE — Telephone Encounter (Signed)
Patient requests to be called at ph# [phone] re: discuss upcoming surgery and her DM medication (pump). Please call patient at ph# listed above to advise.

## 2018-05-23 NOTE — Telephone Encounter (Signed)
Spoke to patient and relayed the information, she did have additional questions and I advised her to contact the surgeon office and she agreed.

## 2018-05-23 NOTE — Telephone Encounter (Signed)
No, please advise her to disconnect the pump during the surgery if the surgery is longer than an hour.  They will need to do an insulin drip in that case.   She can use the same basal rate but obviously do not bolus for meals as she will be n.p.o. that morning.  She can bolus with correction if she is not eating but her sugars are high.  Otherwise, resume bolusing for meals normally whenever she starts eating.

## 2018-05-23 NOTE — Telephone Encounter (Signed)
Please advise, do we need a doxy or phone visit?

## 2018-06-01 ENCOUNTER — Other Ambulatory Visit: Payer: Self-pay | Admitting: Family Medicine

## 2018-06-03 ENCOUNTER — Encounter (HOSPITAL_BASED_OUTPATIENT_CLINIC_OR_DEPARTMENT_OTHER): Payer: Self-pay | Admitting: *Deleted

## 2018-06-03 ENCOUNTER — Other Ambulatory Visit: Payer: Self-pay

## 2018-06-06 ENCOUNTER — Encounter (HOSPITAL_BASED_OUTPATIENT_CLINIC_OR_DEPARTMENT_OTHER)
Admission: RE | Admit: 2018-06-06 | Discharge: 2018-06-06 | Disposition: A | Discharge status: Home / Self Care | Payer: 59 | Source: Ambulatory Visit | Attending: Surgery | Admitting: Surgery

## 2018-06-06 ENCOUNTER — Other Ambulatory Visit (HOSPITAL_COMMUNITY)
Admission: RE | Admit: 2018-06-06 | Discharge: 2018-06-06 | Disposition: A | Discharge status: Home / Self Care | Payer: 59 | Source: Ambulatory Visit | Attending: Surgery | Admitting: Surgery

## 2018-06-06 ENCOUNTER — Other Ambulatory Visit: Payer: Self-pay

## 2018-06-06 DIAGNOSIS — Z8041 Family history of malignant neoplasm of ovary: Secondary | ICD-10-CM | POA: Diagnosis not present

## 2018-06-06 DIAGNOSIS — Z8489 Family history of other specified conditions: Secondary | ICD-10-CM | POA: Diagnosis not present

## 2018-06-06 DIAGNOSIS — Z1159 Encounter for screening for other viral diseases: Secondary | ICD-10-CM | POA: Insufficient documentation

## 2018-06-06 DIAGNOSIS — Z8249 Family history of ischemic heart disease and other diseases of the circulatory system: Secondary | ICD-10-CM | POA: Diagnosis not present

## 2018-06-06 DIAGNOSIS — Z836 Family history of other diseases of the respiratory system: Secondary | ICD-10-CM | POA: Diagnosis not present

## 2018-06-06 DIAGNOSIS — E119 Type 2 diabetes mellitus without complications: Secondary | ICD-10-CM | POA: Diagnosis not present

## 2018-06-06 DIAGNOSIS — Z79899 Other long term (current) drug therapy: Secondary | ICD-10-CM | POA: Diagnosis not present

## 2018-06-06 DIAGNOSIS — Z885 Allergy status to narcotic agent status: Secondary | ICD-10-CM | POA: Diagnosis not present

## 2018-06-06 DIAGNOSIS — R0609 Other forms of dyspnea: Secondary | ICD-10-CM | POA: Diagnosis not present

## 2018-06-06 DIAGNOSIS — Z7984 Long term (current) use of oral hypoglycemic drugs: Secondary | ICD-10-CM | POA: Diagnosis not present

## 2018-06-06 DIAGNOSIS — Z8371 Family history of colonic polyps: Secondary | ICD-10-CM | POA: Diagnosis not present

## 2018-06-06 DIAGNOSIS — D242 Benign neoplasm of left breast: Secondary | ICD-10-CM | POA: Diagnosis present

## 2018-06-06 DIAGNOSIS — Z8349 Family history of other endocrine, nutritional and metabolic diseases: Secondary | ICD-10-CM | POA: Diagnosis not present

## 2018-06-06 DIAGNOSIS — M1990 Unspecified osteoarthritis, unspecified site: Secondary | ICD-10-CM | POA: Diagnosis not present

## 2018-06-06 LAB — CBC WITH DIFFERENTIAL/PLATELET
Abs Immature Granulocytes: 0.01 10*3/uL (ref 0.00–0.07)
Basophils Absolute: 0.1 10*3/uL (ref 0.0–0.1)
Basophils Relative: 1 %
Eosinophils Absolute: 0.4 10*3/uL (ref 0.0–0.5)
Eosinophils Relative: 5 %
HCT: 41.2 % (ref 36.0–46.0)
Hemoglobin: 13.8 g/dL (ref 12.0–15.0)
Immature Granulocytes: 0 %
Lymphocytes Relative: 26 %
Lymphs Abs: 2 10*3/uL (ref 0.7–4.0)
MCH: 29.8 pg (ref 26.0–34.0)
MCHC: 33.5 g/dL (ref 30.0–36.0)
MCV: 89 fL (ref 80.0–100.0)
Monocytes Absolute: 0.5 10*3/uL (ref 0.1–1.0)
Monocytes Relative: 7 %
Neutro Abs: 4.8 10*3/uL (ref 1.7–7.7)
Neutrophils Relative %: 61 %
Platelets: 283 10*3/uL (ref 150–400)
RBC: 4.63 MIL/uL (ref 3.87–5.11)
RDW: 12.5 % (ref 11.5–15.5)
WBC: 7.7 10*3/uL (ref 4.0–10.5)
nRBC: 0 % (ref 0.0–0.2)

## 2018-06-06 LAB — COMPREHENSIVE METABOLIC PANEL
ALT: 16 U/L (ref 0–44)
AST: 22 U/L (ref 15–41)
Albumin: 3.8 g/dL (ref 3.5–5.0)
Alkaline Phosphatase: 60 U/L (ref 38–126)
Anion gap: 8 (ref 5–15)
BUN: 12 mg/dL (ref 6–20)
CO2: 28 mmol/L (ref 22–32)
Calcium: 9.5 mg/dL (ref 8.9–10.3)
Chloride: 104 mmol/L (ref 98–111)
Creatinine, Ser: 0.89 mg/dL (ref 0.44–1.00)
GFR calc Af Amer: >60 mL/min (ref >60)
GFR calc non Af Amer: >60 mL/min (ref >60)
Glucose, Bld: 105 mg/dL — ABNORMAL HIGH (ref 70–99)
Potassium: 4.9 mmol/L (ref 3.5–5.1)
Sodium: 140 mmol/L (ref 135–145)
Total Bilirubin: 0.5 mg/dL (ref 0.3–1.2)
Total Protein: 6.8 g/dL (ref 6.5–8.1)

## 2018-06-06 LAB — HM DIABETES EYE EXAM

## 2018-06-06 NOTE — Progress Notes (Signed)
Gatorade 2 drink given with instructions to complete by 0500 dos, pt verbalized understanding.

## 2018-06-07 LAB — NOVEL CORONAVIRUS, NAA (HOSP ORDER, SEND-OUT TO REF LAB; TAT 18-24 HRS): SARS-CoV-2, NAA: NOT DETECTED

## 2018-06-09 ENCOUNTER — Ambulatory Visit
Admission: RE | Admit: 2018-06-09 | Discharge: 2018-06-09 | Disposition: A | Discharge status: Home / Self Care | Payer: 59 | Source: Ambulatory Visit | Attending: Surgery | Admitting: Surgery

## 2018-06-09 ENCOUNTER — Other Ambulatory Visit: Payer: Self-pay

## 2018-06-09 DIAGNOSIS — D242 Benign neoplasm of left breast: Secondary | ICD-10-CM

## 2018-06-10 ENCOUNTER — Encounter (HOSPITAL_BASED_OUTPATIENT_CLINIC_OR_DEPARTMENT_OTHER)
Admission: RE | Disposition: A | Discharge status: Home / Self Care | Payer: Self-pay | Source: Home / Self Care | Attending: Surgery

## 2018-06-10 ENCOUNTER — Encounter (HOSPITAL_BASED_OUTPATIENT_CLINIC_OR_DEPARTMENT_OTHER): Payer: Self-pay

## 2018-06-10 ENCOUNTER — Other Ambulatory Visit: Payer: Self-pay

## 2018-06-10 ENCOUNTER — Ambulatory Visit (HOSPITAL_BASED_OUTPATIENT_CLINIC_OR_DEPARTMENT_OTHER)
Admission: RE | Admit: 2018-06-10 | Discharge: 2018-06-10 | Disposition: A | Discharge status: Home / Self Care | Payer: 59 | Attending: Surgery | Admitting: Surgery

## 2018-06-10 ENCOUNTER — Ambulatory Visit
Admission: RE | Admit: 2018-06-10 | Discharge: 2018-06-10 | Disposition: A | Discharge status: Home / Self Care | Payer: 59 | Source: Ambulatory Visit | Attending: Surgery | Admitting: Surgery

## 2018-06-10 ENCOUNTER — Ambulatory Visit (HOSPITAL_BASED_OUTPATIENT_CLINIC_OR_DEPARTMENT_OTHER): Payer: 59 | Admitting: Anesthesiology

## 2018-06-10 DIAGNOSIS — Z8489 Family history of other specified conditions: Secondary | ICD-10-CM | POA: Insufficient documentation

## 2018-06-10 DIAGNOSIS — D242 Benign neoplasm of left breast: Secondary | ICD-10-CM

## 2018-06-10 DIAGNOSIS — Z79899 Other long term (current) drug therapy: Secondary | ICD-10-CM | POA: Insufficient documentation

## 2018-06-10 DIAGNOSIS — Z8041 Family history of malignant neoplasm of ovary: Secondary | ICD-10-CM | POA: Insufficient documentation

## 2018-06-10 DIAGNOSIS — R0609 Other forms of dyspnea: Secondary | ICD-10-CM | POA: Insufficient documentation

## 2018-06-10 DIAGNOSIS — M1990 Unspecified osteoarthritis, unspecified site: Secondary | ICD-10-CM | POA: Insufficient documentation

## 2018-06-10 DIAGNOSIS — Z836 Family history of other diseases of the respiratory system: Secondary | ICD-10-CM | POA: Insufficient documentation

## 2018-06-10 DIAGNOSIS — Z8349 Family history of other endocrine, nutritional and metabolic diseases: Secondary | ICD-10-CM | POA: Insufficient documentation

## 2018-06-10 DIAGNOSIS — E119 Type 2 diabetes mellitus without complications: Secondary | ICD-10-CM | POA: Insufficient documentation

## 2018-06-10 DIAGNOSIS — Z8249 Family history of ischemic heart disease and other diseases of the circulatory system: Secondary | ICD-10-CM | POA: Insufficient documentation

## 2018-06-10 DIAGNOSIS — Z8371 Family history of colonic polyps: Secondary | ICD-10-CM | POA: Insufficient documentation

## 2018-06-10 DIAGNOSIS — Z885 Allergy status to narcotic agent status: Secondary | ICD-10-CM | POA: Insufficient documentation

## 2018-06-10 DIAGNOSIS — Z7984 Long term (current) use of oral hypoglycemic drugs: Secondary | ICD-10-CM | POA: Insufficient documentation

## 2018-06-10 HISTORY — DX: Other specified postprocedural states: Z98.890

## 2018-06-10 HISTORY — PX: BREAST LUMPECTOMY WITH RADIOACTIVE SEED LOCALIZATION: SHX6424

## 2018-06-10 HISTORY — PX: BREAST EXCISIONAL BIOPSY: SUR124

## 2018-06-10 HISTORY — DX: Nausea with vomiting, unspecified: R11.2

## 2018-06-10 LAB — GLUCOSE, CAPILLARY
Glucose-Capillary: 113 mg/dL — ABNORMAL HIGH (ref 70–99)
Glucose-Capillary: 112 mg/dL — ABNORMAL HIGH (ref 70–99)

## 2018-06-10 SURGERY — BREAST LUMPECTOMY WITH RADIOACTIVE SEED LOCALIZATION
Anesthesia: General | Site: Breast | Laterality: Left

## 2018-06-10 MED ORDER — GABAPENTIN 300 MG PO CAPS
300.0000 mg | ORAL_CAPSULE | ORAL | Status: AC
  Administered 2018-06-10: 300 mg via ORAL

## 2018-06-10 MED ORDER — LACTATED RINGERS IV SOLN
INTRAVENOUS | Status: DC
  Administered 2018-06-10: 09:00:00 via INTRAVENOUS

## 2018-06-10 MED ORDER — PHENYLEPHRINE HCL (PRESSORS) 10 MG/ML IV SOLN
INTRAVENOUS | Status: DC | PRN
  Administered 2018-06-10: 80 ug via INTRAVENOUS

## 2018-06-10 MED ORDER — LIDOCAINE 2% (20 MG/ML) 5 ML SYRINGE
INTRAMUSCULAR | Status: AC
  Filled 2018-06-10: qty 5

## 2018-06-10 MED ORDER — MIDAZOLAM HCL 5 MG/5ML IJ SOLN
INTRAMUSCULAR | Status: DC | PRN
  Administered 2018-06-10: 2 mg via INTRAVENOUS

## 2018-06-10 MED ORDER — SCOPOLAMINE 1 MG/3DAYS TD PT72: 1.0000 | MEDICATED_PATCH | Freq: Once | TRANSDERMAL | Status: DC | PRN

## 2018-06-10 MED ORDER — FENTANYL CITRATE (PF) 100 MCG/2ML IJ SOLN
INTRAMUSCULAR | Status: DC | PRN
  Administered 2018-06-10: 50 ug via INTRAVENOUS

## 2018-06-10 MED ORDER — ONDANSETRON HCL 4 MG/2ML IJ SOLN
INTRAMUSCULAR | Status: AC
  Filled 2018-06-10: qty 2

## 2018-06-10 MED ORDER — DEXAMETHASONE SODIUM PHOSPHATE 4 MG/ML IJ SOLN
INTRAMUSCULAR | Status: DC | PRN
  Administered 2018-06-10: 10 mg via INTRAVENOUS

## 2018-06-10 MED ORDER — LIDOCAINE 2% (20 MG/ML) 5 ML SYRINGE
INTRAMUSCULAR | Status: DC | PRN
  Administered 2018-06-10: 50 mg via INTRAVENOUS

## 2018-06-10 MED ORDER — PROPOFOL 10 MG/ML IV BOLUS
INTRAVENOUS | Status: AC
  Filled 2018-06-10: qty 20

## 2018-06-10 MED ORDER — IBUPROFEN 800 MG PO TABS: 800.0000 mg | ORAL_TABLET | Freq: Three times a day (TID) | ORAL | 0 refills | Status: DC | PRN

## 2018-06-10 MED ORDER — DEXAMETHASONE SODIUM PHOSPHATE 10 MG/ML IJ SOLN
INTRAMUSCULAR | Status: AC
  Filled 2018-06-10: qty 1

## 2018-06-10 MED ORDER — CEFAZOLIN SODIUM-DEXTROSE 2-4 GM/100ML-% IV SOLN: 2.0000 g | INTRAVENOUS | Status: DC

## 2018-06-10 MED ORDER — ACETAMINOPHEN 500 MG PO TABS
ORAL_TABLET | ORAL | Status: AC
  Filled 2018-06-10: qty 2

## 2018-06-10 MED ORDER — GABAPENTIN 300 MG PO CAPS
ORAL_CAPSULE | ORAL | Status: AC
  Filled 2018-06-10: qty 1

## 2018-06-10 MED ORDER — ONDANSETRON HCL 4 MG/2ML IJ SOLN
INTRAMUSCULAR | Status: DC | PRN
  Administered 2018-06-10: 2 mg via INTRAVENOUS
  Administered 2018-06-10: 4 mg via INTRAVENOUS

## 2018-06-10 MED ORDER — MIDAZOLAM HCL 2 MG/2ML IJ SOLN
INTRAMUSCULAR | Status: AC
  Filled 2018-06-10: qty 2

## 2018-06-10 MED ORDER — CHLORHEXIDINE GLUCONATE CLOTH 2 % EX PADS: 6.0000 | MEDICATED_PAD | Freq: Once | CUTANEOUS | Status: DC

## 2018-06-10 MED ORDER — FENTANYL CITRATE (PF) 100 MCG/2ML IJ SOLN: 50.0000 ug | INTRAMUSCULAR | Status: DC | PRN

## 2018-06-10 MED ORDER — CELECOXIB 200 MG PO CAPS
ORAL_CAPSULE | ORAL | Status: AC
  Filled 2018-06-10: qty 1

## 2018-06-10 MED ORDER — CELECOXIB 200 MG PO CAPS
200.0000 mg | ORAL_CAPSULE | ORAL | Status: AC
  Administered 2018-06-10: 200 mg via ORAL

## 2018-06-10 MED ORDER — FENTANYL CITRATE (PF) 100 MCG/2ML IJ SOLN: INTRAMUSCULAR | Status: DC | PRN

## 2018-06-10 MED ORDER — ACETAMINOPHEN 500 MG PO TABS
1000.0000 mg | ORAL_TABLET | Freq: Once | ORAL | Status: AC
  Administered 2018-06-10: 1000 mg via ORAL

## 2018-06-10 MED ORDER — MIDAZOLAM HCL 2 MG/2ML IJ SOLN: 1.0000 mg | INTRAMUSCULAR | Status: DC | PRN

## 2018-06-10 MED ORDER — FENTANYL CITRATE (PF) 100 MCG/2ML IJ SOLN
INTRAMUSCULAR | Status: AC
  Filled 2018-06-10: qty 2

## 2018-06-10 MED ORDER — BUPIVACAINE-EPINEPHRINE (PF) 0.25% -1:200000 IJ SOLN
INTRAMUSCULAR | Status: DC | PRN
  Administered 2018-06-10: 10 mL

## 2018-06-10 MED ORDER — CEFAZOLIN SODIUM-DEXTROSE 2-4 GM/100ML-% IV SOLN
INTRAVENOUS | Status: AC
  Filled 2018-06-10: qty 100

## 2018-06-10 MED ORDER — PROPOFOL 10 MG/ML IV BOLUS
INTRAVENOUS | Status: DC | PRN
  Administered 2018-06-10: 200 mg via INTRAVENOUS

## 2018-06-10 SURGICAL SUPPLY — 50 items
APPLIER CLIP 9.375 MED OPEN (MISCELLANEOUS)
BINDER BREAST LRG (GAUZE/BANDAGES/DRESSINGS) IMPLANT
BINDER BREAST MEDIUM (GAUZE/BANDAGES/DRESSINGS) IMPLANT
BINDER BREAST XLRG (GAUZE/BANDAGES/DRESSINGS) IMPLANT
BINDER BREAST XXLRG (GAUZE/BANDAGES/DRESSINGS) IMPLANT
BLADE SURG 15 STRL LF DISP TIS (BLADE) ×1 IMPLANT
BLADE SURG 15 STRL SS (BLADE) ×2
CANISTER SUC SOCK COL 7IN (MISCELLANEOUS) IMPLANT
CANISTER SUCT 1200ML W/VALVE (MISCELLANEOUS) IMPLANT
CHLORAPREP W/TINT 26 (MISCELLANEOUS) ×3 IMPLANT
CLIP APPLIE 9.375 MED OPEN (MISCELLANEOUS) IMPLANT
COVER BACK TABLE REUSABLE LG (DRAPES) ×3 IMPLANT
COVER MAYO STAND REUSABLE (DRAPES) ×3 IMPLANT
COVER PROBE W GEL 5X96 (DRAPES) ×3 IMPLANT
COVER WAND RF STERILE (DRAPES) IMPLANT
DECANTER SPIKE VIAL GLASS SM (MISCELLANEOUS) IMPLANT
DERMABOND ADVANCED (GAUZE/BANDAGES/DRESSINGS) ×2
DERMABOND ADVANCED .7 DNX12 (GAUZE/BANDAGES/DRESSINGS) ×1 IMPLANT
DRAPE LAPAROSCOPIC ABDOMINAL (DRAPES) IMPLANT
DRAPE LAPAROTOMY 100X72 PEDS (DRAPES) ×3 IMPLANT
DRAPE UTILITY XL STRL (DRAPES) ×3 IMPLANT
ELECT COATED BLADE 2.86 ST (ELECTRODE) ×3 IMPLANT
ELECT REM PT RETURN 9FT ADLT (ELECTROSURGICAL) ×3
ELECTRODE REM PT RTRN 9FT ADLT (ELECTROSURGICAL) ×1 IMPLANT
GLOVE BIO SURGEON STRL SZ 6.5 (GLOVE) ×2 IMPLANT
GLOVE BIO SURGEONS STRL SZ 6.5 (GLOVE) ×1
GLOVE BIOGEL PI IND STRL 8 (GLOVE) ×1 IMPLANT
GLOVE BIOGEL PI INDICATOR 8 (GLOVE) ×2
GLOVE ECLIPSE 8.0 STRL XLNG CF (GLOVE) ×3 IMPLANT
GLOVE EXAM NITRILE MD LF STRL (GLOVE) ×3 IMPLANT
GOWN STRL REUS W/ TWL LRG LVL3 (GOWN DISPOSABLE) ×2 IMPLANT
GOWN STRL REUS W/TWL LRG LVL3 (GOWN DISPOSABLE) ×4
HEMOSTAT ARISTA ABSORB 3G PWDR (HEMOSTASIS) IMPLANT
HEMOSTAT SNOW SURGICEL 2X4 (HEMOSTASIS) IMPLANT
KIT MARKER MARGIN INK (KITS) ×3 IMPLANT
NEEDLE HYPO 25X1 1.5 SAFETY (NEEDLE) ×3 IMPLANT
NS IRRIG 1000ML POUR BTL (IV SOLUTION) ×3 IMPLANT
PACK BASIN DAY SURGERY FS (CUSTOM PROCEDURE TRAY) ×3 IMPLANT
PENCIL BUTTON HOLSTER BLD 10FT (ELECTRODE) ×3 IMPLANT
SLEEVE SCD COMPRESS KNEE MED (MISCELLANEOUS) ×3 IMPLANT
SPONGE LAP 4X18 RFD (DISPOSABLE) ×3 IMPLANT
SUT MNCRL AB 4-0 PS2 18 (SUTURE) ×3 IMPLANT
SUT SILK 2 0 SH (SUTURE) IMPLANT
SUT VICRYL 3-0 CR8 SH (SUTURE) ×3 IMPLANT
SYR CONTROL 10ML LL (SYRINGE) ×3 IMPLANT
TOWEL GREEN STERILE FF (TOWEL DISPOSABLE) ×3 IMPLANT
TRAY FAXITRON CT DISP (TRAY / TRAY PROCEDURE) ×3 IMPLANT
TUBE CONNECTING 20'X1/4 (TUBING)
TUBE CONNECTING 20X1/4 (TUBING) IMPLANT
YANKAUER SUCT BULB TIP NO VENT (SUCTIONS) IMPLANT

## 2018-06-10 NOTE — Anesthesia Procedure Notes (Signed)
Procedure Name: LMA Insertion Date/Time: 06/10/2018 9:02 AM Performed by: Marrianne Mood, CRNA Pre-anesthesia Checklist: Patient identified, Emergency Drugs available, Suction available and Patient being monitored Patient Re-evaluated:Patient Re-evaluated prior to induction Oxygen Delivery Method: Circle system utilized Preoxygenation: Pre-oxygenation with 100% oxygen Induction Type: IV induction Ventilation: Mask ventilation without difficulty LMA: LMA inserted LMA Size: 4.0 Number of attempts: 1 Airway Equipment and Method: Bite block Placement Confirmation: positive ETCO2 Tube secured with: Tape Dental Injury: Teeth and Oropharynx as per pre-operative assessment

## 2018-06-10 NOTE — Anesthesia Postprocedure Evaluation (Signed)
Anesthesia Post Note  Patient: Chelsea Mercado  Procedure(s) Performed: LEFT BREAST LUMPECTOMY WITH RADIOACTIVE SEED LOCALIZATION (Left Breast)     Patient location during evaluation: PACU Anesthesia Type: General Level of consciousness: awake Pain management: pain level controlled Vital Signs Assessment: post-procedure vital signs reviewed and stable Respiratory status: spontaneous breathing Postop Assessment: no apparent nausea or vomiting Anesthetic complications: no    Last Vitals:  Vitals:   06/10/18 1100 06/10/18 1140  BP: 119/68 129/69  Pulse: 92 97  Resp: 18 18  Temp:  36.7 C  SpO2: 95% 96%    Last Pain:  Vitals:   06/10/18 1140  TempSrc:   PainSc: 3                  Hoover Grewe

## 2018-06-10 NOTE — Anesthesia Preprocedure Evaluation (Signed)
Anesthesia Evaluation  Patient identified by MRN, date of birth, ID band Patient awake    Reviewed: Allergy & Precautions, NPO status   History of Anesthesia Complications (+) PONV  Airway Mallampati: II  TM Distance: >3 FB     Dental   Pulmonary    breath sounds clear to auscultation       Cardiovascular + DOE   Rhythm:Regular Rate:Normal     Neuro/Psych    GI/Hepatic negative GI ROS, Neg liver ROS,   Endo/Other  diabetes  Renal/GU negative Renal ROS     Musculoskeletal   Abdominal   Peds  Hematology   Anesthesia Other Findings   Reproductive/Obstetrics                             Anesthesia Physical Anesthesia Plan  ASA: II  Anesthesia Plan: General   Post-op Pain Management:    Induction: Intravenous  PONV Risk Score and Plan: 3 and Ondansetron, Dexamethasone and Midazolam  Airway Management Planned: LMA and Oral ETT  Additional Equipment:   Intra-op Plan:   Post-operative Plan: Extubation in OR  Informed Consent: I have reviewed the patients History and Physical, chart, labs and discussed the procedure including the risks, benefits and alternatives for the proposed anesthesia with the patient or authorized representative who has indicated his/her understanding and acceptance.     Dental advisory given  Plan Discussed with: CRNA  Anesthesia Plan Comments:         Anesthesia Quick Evaluation

## 2018-06-10 NOTE — Transfer of Care (Signed)
Immediate Anesthesia Transfer of Care Note  Patient: Chelsea Mercado  Procedure(s) Performed: LEFT BREAST LUMPECTOMY WITH RADIOACTIVE SEED LOCALIZATION (Left Breast)  Patient Location: PACU  Anesthesia Type:General  Level of Consciousness: sedated  Airway & Oxygen Therapy: Patient Spontanous Breathing and Patient connected to nasal cannula oxygen  Post-op Assessment: Report given to RN and Post -op Vital signs reviewed and stable  Post vital signs: Reviewed and stable  Last Vitals:  Vitals Value Taken Time  BP 115/63 06/10/2018  9:46 AM  Temp    Pulse 98 06/10/2018  9:47 AM  Resp 6 06/10/2018  9:47 AM  SpO2 100 % 06/10/2018  9:47 AM  Vitals shown include unvalidated device data.  Last Pain:  Vitals:   06/10/18 0815  TempSrc: Oral  PainSc: 0-No pain         Complications: No apparent anesthesia complications

## 2018-06-10 NOTE — H&P (Signed)
Chelsea Mercado Documented: 04/25/2018 9:23 AM Location: [facility] Patient #: [id number] DOB: 1960-08-03 Married / Language: Chelsea Mercado / Race: White Female  History of Present Illness Chelsea Moores A. [name] MD; 04/25/2018 10:00 AM) Patient words: CLINICAL DATA: 58 year old female recalled from screening mammogram dated  EXAM: DIGITAL DIAGNOSTIC LEFT MAMMOGRAM WITH CAD  COMPARISON: Previous exam(s).  ACR Breast Density Category c: The breast tissue is heterogeneously dense, which may obscure small masses.  FINDINGS: Grouped punctate calcifications spanning approximately 1 cm demonstrated in the subareolar left breast at anterior depth. Today's magnification views demonstrate another 3 mm group of calcifications just behind the nipple. This second group is likely seen on prior mammograms dating back to at least 2015.  Mammographic images were processed with CAD.  IMPRESSION: 1. Indeterminate left breast calcifications in the subareolar aspect spanning approximately 1 cm. Recommendation is for definitive tissue diagnosis with stereotactic biopsy. 2. Additional group of 3 mm calcifications located just below the skin along the left nipple. These may be seen on prior mammograms dating back to 2014. However, they would not be amenable to stereotactic biopsy given the location.  RECOMMENDATION: 1. Stereotactic biopsy is recommended for the larger 1 cm group of calcifications at anterior to middle depth. 2. If above biopsy demonstrates benign pathology, six-month follow-up is recommended for the additional 3 mm group of calcifications just behind the nipple. If this area demonstrates atypia or malignancy, additional excision of the 3 mm group of calcifications would be recommended at the time of surgery.  I have discussed the findings and recommendations with the patient. Results were also provided in writing at the conclusion of the visit. If applicable, a reminder  letter will be sent to the patient regarding the next appointment.  BI-RADS CATEGORY 4: Suspicious.   Electronically Signed By: Chelsea Mercado M.D. On: 03/27/2018 12:06     Pt presents at the request of Dr Chelsea Mercado for left breast microcalcifications and bx that showed papilloma. No nipple discharge or mass Denies pain. Had some soreness and bruising around the biopsy site.                        Diagnosis Breast, left, needle core biopsy, retroareolar location - PAPILLARY LESION - USUAL DUCTAL HYPERPLASIA, FIBROCYSTIC CHANGES, AND FIBROADENOMATOID NODULE - CALCIFICATIONS - SEE COMMENT Microscopic Comment The papillary lesion is favored to be an intraductal papilloma.  The patient is a 58 year old female.   Past Surgical History Chelsea Mercado, CMA; 04/25/2018 9:23 AM) Appendectomy Breast Biopsy Left. Cesarean Section - 1 Tonsillectomy  Diagnostic Studies History Chelsea Mercado, CMA; 04/25/2018 9:23 AM) Colonoscopy 5-10 years ago Mammogram within last year Pap Smear 1-5 years ago  Allergies Chelsea Mercado, CMA; 04/25/2018 9:25 AM) Vicodin ES *ANALGESICS - OPIOID* Allergies Reconciled  Medication History Chelsea Mercado, CMA; 04/25/2018 9:25 AM) Contour Next Test (In Vitro) Active. HumaLOG (100UNIT/ML Solution, Subcutaneous) Active. Levothyroxine Sodium (125MCG Tablet, Oral) Active. Pravastatin Sodium (40MG  Tablet, Oral) Active. Ramipril (10MG  Capsule, Oral) Active. Medications Reconciled  Social History Chelsea Mercado, CMA; 04/25/2018 9:23 AM) Alcohol use Occasional alcohol use. Caffeine use Carbonated beverages, Coffee, Tea. No drug use Tobacco use Never smoker.  Family History Chelsea Mercado, [place]; 04/25/2018 9:23 AM) Anesthetic complications Mother, Sister. Colon Polyps Sister. Heart Disease Family Members In General, Mother. Hypertension Family Members In General, Mother,  Sister. Ovarian Cancer Family Members In General. Respiratory Condition Father. Thyroid problems Mother, Sister.  Pregnancy / Birth History Chelsea Mercado, [place]; 04/25/2018 9:23 AM)  Age at menarche 26 years. Age of menopause 18-50 Gravida 3 Length (months) of breastfeeding 7-12 Maternal age 28-20 Para 3  Other Problems Chelsea Mercado, CMA; 04/25/2018 9:23 AM) Arthritis Diabetes Mellitus General anesthesia - complications Hemorrhoids Lump In Breast Oophorectomy Left. Thyroid Disease     Review of Systems (Chelsea Mercado; 04/25/2018 9:23 AM) General Present- Weight Gain. Not Present- Appetite Loss, Chills, Fatigue, Fever, Night Sweats and Weight Loss. Skin Not Present- Change in Wart/Mole, Dryness, Hives, Jaundice, New Lesions, Non-Healing Wounds, Rash and Ulcer. HEENT Present- Wears glasses/contact lenses. Not Present- Earache, Hearing Loss, Hoarseness, Nose Bleed, Oral Ulcers, Ringing in the Ears, Seasonal Allergies, Sinus Pain, Sore Throat, Visual Disturbances and Yellow Eyes. Respiratory Present- Chronic Cough. Not Present- Bloody sputum, Difficulty Breathing, Snoring and Wheezing. Breast Not Present- Breast Mass, Breast Pain, Nipple Discharge and Skin Changes. Cardiovascular Present- Swelling of Extremities. Not Present- Chest Pain, Difficulty Breathing Lying Down, Leg Cramps, Palpitations, Rapid Heart Rate and Shortness of Breath. Gastrointestinal Present- Hemorrhoids. Not Present- Abdominal Pain, Bloating, Bloody Stool, Change in Bowel Habits, Chronic diarrhea, Constipation, Difficulty Swallowing, Excessive gas, Gets full quickly at meals, Indigestion, Nausea, Rectal Pain and Vomiting. Female Genitourinary Not Present- Frequency, Nocturia, Painful Urination, Pelvic Pain and Urgency. Musculoskeletal Not Present- Back Pain, Joint Pain, Joint Stiffness, Muscle Pain, Muscle Weakness and Swelling of Extremities. Neurological Not Present- Decreased Memory,  Fainting, Headaches, Numbness, Seizures, Tingling, Tremor, Trouble walking and Weakness. Psychiatric Not Present- Anxiety, Bipolar, Change in Sleep Pattern, Depression, Fearful and Frequent crying. Endocrine Not Present- Cold Intolerance, Excessive Hunger, Hair Changes, Heat Intolerance, Hot flashes and New Diabetes. Hematology Not Present- Blood Thinners, Easy Bruising, Excessive bleeding, Gland problems, HIV and Persistent Infections.  Vitals (Sabrina Canty CMA; 04/25/2018 9:26 AM) 04/25/2018 9:26 AM Weight: 187.8 lb Height: 64in Body Surface Area: 1.9 m Body Mass Index: 32.24 kg/m  Temp.: 97.86F(Temporal)  Pulse: 98 (Regular)  P.OX: 97% (Room air) BP: 138/72 (Sitting, Left Arm, Standard)      Physical Exam (Bobbyjo Marulanda A. Earle Burson MD; 04/25/2018 9:58 AM)  General Mental Status-Alert. General Appearance-Consistent with stated age. Hydration-Well hydrated. Voice-Normal.  Head and Neck Head-normocephalic, atraumatic with no lesions or palpable masses. Trachea-midline. Thyroid Gland Characteristics - normal size and consistency.  Chest and Lung Exam Chest and lung exam reveals -quiet, even and easy respiratory effort with no use of accessory muscles and on auscultation, normal breath sounds, no adventitious sounds and normal vocal resonance. Inspection Chest Wall - Normal. Back - normal.  Breast Breast - Left-Symmetric, Non Tender, No Biopsy scars, no Dimpling, No Inflammation, No Lumpectomy scars, No Mastectomy scars, No Peau d' Orange. Breast - Right-Symmetric, Non Tender, No Biopsy scars, no Dimpling, No Inflammation, No Lumpectomy scars, No Mastectomy scars, No Peau d' Orange. Breast Lump-No Palpable Breast Mass.  Cardiovascular Cardiovascular examination reveals -normal heart sounds, regular rate and rhythm with no murmurs and normal pedal pulses bilaterally.  Neurologic Neurologic evaluation reveals -alert and oriented x 3  with no impairment of recent or remote memory. Mental Status-Normal.  Musculoskeletal Normal Exam - Left-Upper Extremity Strength Normal and Lower Extremity Strength Normal. Normal Exam - Right-Upper Extremity Strength Normal and Lower Extremity Strength Normal.  Lymphatic Head & Neck  General Head & Neck Lymphatics: Bilateral - Description - Normal. Axillary  General Axillary Region: Bilateral - Description - Normal. Tenderness - Non Tender.    Assessment & Plan (Kelsay Haggard A. Conor Lata MD; 04/25/2018 9:56 AM)  PAPILLOMA OF LEFT BREAST (D24.2) Impression: Recommend left breast seed lumpectomy for possible low  risk malignancy versus occult disease. Discussed nonoperative management as well. Risk of lumpectomy include bleeding, infection, seroma, more surgery, use of seed/wire, wound care, cosmetic deformity and the need for other treatments, death , blood clots, death. Pt agrees to proceed.  Current Plans You are being scheduled for surgery- Our schedulers will call you.  You should hear from our office's scheduling department within 5 working days about the location, date, and time of surgery. We try to make accommodations for patient's preferences in scheduling surgery, but sometimes the OR schedule or the surgeon's schedule prevents [place] from making those accommodations.  If you have not heard from our office [phone]) in 5 working days, call the office and ask for your surgeon's nurse.  If you have other questions about your diagnosis, plan, or surgery, call the office and ask for your surgeon's nurse.  Pt Education - [facility] Breast Biopsy HCI: discussed with patient and provided information.

## 2018-06-10 NOTE — Discharge Instructions (Signed)
[place] Office Phone Number [phone]  BREAST BIOPSY/ PARTIAL MASTECTOMY: POST OP INSTRUCTIONS  Always review your discharge instruction sheet given to you by the facility where your surgery was performed.  IF YOU HAVE DISABILITY OR FAMILY LEAVE FORMS, YOU MUST BRING THEM TO THE OFFICE FOR PROCESSING.  DO NOT GIVE THEM TO YOUR DOCTOR.  1. A prescription for pain medication may be given to you upon discharge.  Take your pain medication as prescribed, if needed.  If narcotic pain medicine is not needed, then you may take acetaminophen (Tylenol) or ibuprofen (Advil) as needed. No ibuprofen until 4:30pm. 2. Take your usually prescribed medications unless otherwise directed 3. If you need a refill on your pain medication, please contact your pharmacy.  They will contact our office to request authorization.  Prescriptions will not be filled after 5pm or on week-ends. 4. You should eat very light the first 24 hours after surgery, such as soup, crackers, pudding, etc.  Resume your normal diet the day after surgery. 5. Most patients will experience some swelling and bruising in the breast.  Ice packs and a good support bra will help.  Swelling and bruising can take several days to resolve.  6. It is common to experience some constipation if taking pain medication after surgery.  Increasing fluid intake and taking a stool softener will usually help or prevent this problem from occurring.  A mild laxative (Milk of Magnesia or Miralax) should be taken according to package directions if there are no bowel movements after 48 hours. 7. Unless discharge instructions indicate otherwise, you may remove your bandages 24-48 hours after surgery, and you may shower at that time.  You may have steri-strips (small skin tapes) in place directly over the incision.  These strips should be left on the skin for 7-10 days.  If your surgeon used skin glue on the incision, you may shower in 24 hours.  The glue  will flake off over the next 2-3 weeks.  Any sutures or staples will be removed at the office during your follow-up visit. 8. ACTIVITIES:  You may resume regular daily activities (gradually increasing) beginning the next day.  Wearing a good support bra or sports bra minimizes pain and swelling.  You may have sexual intercourse when it is comfortable. a. You may drive when you no longer are taking prescription pain medication, you can comfortably wear a seatbelt, and you can safely maneuver your car and apply brakes. b. RETURN TO WORK:  ______________________________________________________________________________________ 9. You should see your doctor in the office for a follow-up appointment approximately two weeks after your surgery.  Your doctors nurse will typically make your follow-up appointment when she calls you with your pathology report.  Expect your pathology report 2-3 business days after your surgery.  You may call to check if you do not hear from [place] after three days. 10. OTHER INSTRUCTIONS: _______________________________________________________________________________________________ _____________________________________________________________________________________________________________________________________ _____________________________________________________________________________________________________________________________________ _____________________________________________________________________________________________________________________________________  WHEN TO CALL YOUR DOCTOR: 1. Fever over 101.0 2. Nausea and/or vomiting. 3. Extreme swelling or bruising. 4. Continued bleeding from incision. 5. Increased pain, redness, or drainage from the incision.  The clinic staff is available to answer your questions during regular business hours.  Please dont hesitate to call and ask to speak to one of the nurses for clinical concerns.  If you have a medical  emergency, go to the nearest emergency room or call 911.  A surgeon from [facility] is always on call at the hospital.  For further questions,  please visit [email or web address]     Post Anesthesia Home Care Instructions  Activity: Get plenty of rest for the remainder of the day. A responsible individual must stay with you for 24 hours following the procedure.  For the next 24 hours, DO NOT: -Drive a car -Paediatric nurse -Drink alcoholic beverages -Take any medication unless instructed by your physician -Make any legal decisions or sign important papers.  Meals: Start with liquid foods such as gelatin or soup. Progress to regular foods as tolerated. Avoid greasy, spicy, heavy foods. If nausea and/or vomiting occur, drink only clear liquids until the nausea and/or vomiting subsides. Call your physician if vomiting continues.  Special Instructions/Symptoms: Your throat may feel dry or sore from the anesthesia or the breathing tube placed in your throat during surgery. If this causes discomfort, gargle with warm salt water. The discomfort should disappear within 24 hours.  If you had a scopolamine patch placed behind your ear for the management of post- operative nausea and/or vomiting:  1. The medication in the patch is effective for 72 hours, after which it should be removed.  Wrap patch in a tissue and discard in the trash. Wash hands thoroughly with soap and water. 2. You may remove the patch earlier than 72 hours if you experience unpleasant side effects which may include dry mouth, dizziness or visual disturbances. 3. Avoid touching the patch. Wash your hands with soap and water after contact with the patch.

## 2018-06-10 NOTE — Op Note (Signed)
Chelsea Mercado  1960-11-17  [id number]  06/10/2018   Preoperative diagnosis: Left breast papilloma  Postoperative diagnosis: Same  Procedure: Left breast seed localized lumpectomy  Surgeon: Turner Daniels, MD, FACS  Anesthesia: GA combined with regional for post-op pain  Clinical History and Indications: this patient presents for a seed localized excision of a left breast papilloma detected by mammography and core biopsy.  We discussed the pros and cons of excision as well as potential malignant upgrade risk of up to 10%.  We discussed observation as well.  She opted for excision of left breast papilloma.  Discussed the use of seeds and localization.  Discussed postoperative recovery.The procedure has been discussed with the patient. Alternatives to surgery have been discussed with the patient.  Risks of surgery include bleeding,  Infection,  Seroma formation, death,  and the need for further surgery.   The patient understands and wishes to proceed.  Description of procedure: The patient was seen in the holding area and the plans for the procedure reviewed. The left breast was marked as the operative side. The seed localizing films were reviewed.  The patient was taken to the operating room and after satisfactory general anesthesia had been obtained the left breast was prepped and draped and the timeout was performed.  The incision was made over the presumed area of the mass.  Neoprobe used to localize the mass left breast in a subareolar position.  Skin flaps were raised and using cautery the area was completely excised. Bleeders were controlled with either cautery or sutures as needed.  Neoprobe used to verify seed and clip in the specimen.  Faxitron used to verify seed and clip in specimen.  Specimen sent to pathology after orientation with ink.  After achieving hemostasis, the incision was closed with 3-0 Vicryl, 4-0 Monocryl subcuticular, and Dermabond.  The patient tolerated the  procedure well. There were no operative complications. All counts were correct.   EBL: 10 cc  Turner Daniels, MD, FACS 06/10/2018 9:50 AM

## 2018-06-10 NOTE — Interval H&P Note (Signed)
History and Physical Interval Note:  06/10/2018 8:15 AM  Chelsea Mercado  has presented today for surgery, with the diagnosis of papilloma.  The various methods of treatment have been discussed with the patient and family. After consideration of risks, benefits and other options for treatment, the patient has consented to  Procedure(s): LEFT BREAST LUMPECTOMY WITH RADIOACTIVE SEED LOCALIZATION (Left) as a surgical intervention.  The patient's history has been reviewed, patient examined, no change in status, stable for surgery.  I have reviewed the patient's chart and labs.  Questions were answered to the patient's satisfaction.     [place]

## 2018-06-11 ENCOUNTER — Encounter (HOSPITAL_BASED_OUTPATIENT_CLINIC_OR_DEPARTMENT_OTHER): Payer: Self-pay | Admitting: Surgery

## 2018-06-11 ENCOUNTER — Telehealth: Payer: Self-pay

## 2018-06-11 MED ORDER — "INSULIN SYRINGE 31G X 5/16"" 0.3 ML MISC": 0 refills | Status: DC

## 2018-06-11 NOTE — Telephone Encounter (Signed)
Patient has surgery yesterday and found out she was given steroids in her IV, since then her surgery her sugars [phone]- Gave herself  5 units by injection not pump about 20 minutes ago, is there anything else she needs to do?

## 2018-06-11 NOTE — Telephone Encounter (Signed)
For the.  Of the time when her sugars are higher, she will need a lower insulin to carb ratio for meals.  As of now, this is 1: 10 and I would probably decrease it to 1: 6 however, she needs to go back to the 1: 10 as soon as the steroid effect is wearing off.  Otherwise, she needs to continue to check her sugars and correct them if they are higher than her target.   Please let us know if this does not help, in that case, we may need further changes.

## 2018-06-13 NOTE — Telephone Encounter (Signed)
Notified patient of message from Dr. Gherghe, patient expressed understanding and agreement. No further questions.  

## 2018-08-27 ENCOUNTER — Ambulatory Visit: Payer: 59 | Admitting: Family Medicine

## 2018-08-28 ENCOUNTER — Other Ambulatory Visit: Payer: Self-pay

## 2018-08-29 ENCOUNTER — Ambulatory Visit: Payer: 59 | Admitting: Internal Medicine

## 2018-09-01 ENCOUNTER — Ambulatory Visit: Payer: 59 | Admitting: Internal Medicine

## 2018-09-01 ENCOUNTER — Other Ambulatory Visit: Payer: Self-pay

## 2018-09-01 ENCOUNTER — Encounter: Payer: Self-pay | Admitting: Internal Medicine

## 2018-09-01 VITALS — BP 128/60 | HR 84 | Ht 64.0 in | Wt 188.0 lb

## 2018-09-01 DIAGNOSIS — E041 Nontoxic single thyroid nodule: Secondary | ICD-10-CM | POA: Diagnosis not present

## 2018-09-01 DIAGNOSIS — E039 Hypothyroidism, unspecified: Secondary | ICD-10-CM | POA: Diagnosis not present

## 2018-09-01 DIAGNOSIS — E1065 Type 1 diabetes mellitus with hyperglycemia: Secondary | ICD-10-CM

## 2018-09-01 DIAGNOSIS — E559 Vitamin D deficiency, unspecified: Secondary | ICD-10-CM | POA: Diagnosis not present

## 2018-09-01 DIAGNOSIS — J3489 Other specified disorders of nose and nasal sinuses: Secondary | ICD-10-CM

## 2018-09-01 LAB — POCT GLYCOSYLATED HEMOGLOBIN (HGB A1C): Hemoglobin A1C: 7.5 % — AB (ref 4.0–5.6)

## 2018-09-01 LAB — LIPID PANEL
Cholesterol: 205 mg/dL — ABNORMAL HIGH (ref 0–200)
HDL: 58.5 mg/dL (ref >39.00)
LDL Cholesterol: 121 mg/dL — ABNORMAL HIGH (ref 0–99)
NonHDL: 146.1
Total CHOL/HDL Ratio: 3
Triglycerides: 127 mg/dL (ref 0.0–149.0)
VLDL: 25.4 mg/dL (ref 0.0–40.0)

## 2018-09-01 NOTE — Progress Notes (Signed)
Patient ID: Chelsea Mercado, female   DOB: February 05, 1960, 58 y.o.   MRN: [id number]   HPI: Chelsea Mercado is a 58 y.o.-year-old female, returning for f/u for DM1, dx'ed at 58 y/o, uncontrolled, without long term complications and hypothyroidism. She saw endocrinology before: Dr. Cherie Dark.  Last visit with me 4 months ago (virtual).  Since last visit, she was diagnosed with a breast tumor (premlignant; papilloma) and had lumpectomy.  Last hemoglobin A1c was: Lab Results  Component Value Date   HGBA1C 8.0 (A) 12/09/2017   HGBA1C 7.9 (A) 08/06/2017   HGBA1C 8.2% 04/05/2017  02/14/2016: HbA1c 7.6%  She continues on an insulin pump: Medtronic 670 G warm on her arm -started 2016-2017, new pump from 01/2017. She is not wearing the CGM since she does not have enough space on the abdomen to attach it.   Uses Humalog in the pump.  Pump settings: - basal rates: 12 AM: 0.775 units/h 5 AM: 0.950 9 AM: 0.775  - ICR: 1:10 - target: 100-120 - ISF: 40 >> 50 - Active insulin time: 3 hours  Please enter the carbs for snacks and desserts. Also, enter 5-10 g carbs for coffee.  If you exercise, decrease the basal rate to 50% for the duration of exercise and 1 hour afterwards.  - bolus wizard: on TDD from basal insulin: 51% >> 52% (20 units) TDD from bolus insulin: 41% >> 38% (12 units) Total daily dose: up to 50 units - changes infusion site: q 3 days  - Meter: Bayer Contour  Pt checks her sugars 4.8x a day - ave 150 +/- 59 for the last 2 weeks: - am:  66-229 >> 68, 88-219, 340 (site pb) >> 82-173 >> 74-161, 238, 293 - 2h after b'fast: 67, 110-194, 259 >> n/c >> 67 after a walk >> 102 - before lunch: 81-242, 285 >> 89-214 >> 75-105 >> 62-152, 182, 207 - 2h after lunch: n/c  >> 219-253 >> n/c >> 182 >> n/c - before dinner:  111-237, 251 >> 165, 201 (snack) >> 88-243 - 2h after dinner: n/c >> 70-195 >> n/c >> see below - bedtime:  242 >> see above >> 151-257, 322 >> n/c >> 148-241 - nighttime: 79 >> n/c >>  217 >> 153 Lowest sugar was  67 >> 62; she has hypoglycemia awareness in the 60s.  She had 1 previous ED visit for hypoglycemia many years ago.  She has a non-expired glucagon kit at home. Highest sugar was 556 (site pbs), 340 (site pb) >> 254 >> 293.  No previous DKA admissions.  Pt's meals are: - Breakfast: egg, bread, milk; milk + cereals; coffee - Lunch: Sandwich, soup, salad, fruit, chips; milk - Dinner: Meat, potatoes, brown rice, veggies or salad - Snacks: 1 She was walking 2-3 times a day (after meals) up to 1 hour a day, but not currently.  She plans to restart this.  -No CKD, last BUN/creatinine:  Lab Results  Component Value Date   BUN 12 06/06/2018   BUN 18 03/13/2018   CREATININE 0.89 06/06/2018   CREATININE 0.91 03/13/2018   On ramipril 10. -+ HL. Last set of lipids: Lab Results  Component Value Date   CHOL 183 09/23/2017   HDL 63.40 09/23/2017   LDLCALC 104 (H) 09/23/2017   TRIG 79.0 09/23/2017   CHOLHDL 3 09/23/2017   On atorvastatin 40. - last eye exam was in 05/2017: No DR - no numbness and tingling in her feet.  Pt has no FH of DM.  Hypothyroidism:  Latest TSH was normal: Lab Results  Component Value Date   TSH 2.16 03/13/2018  02/10/2016: TSH 6.671 08/06/2015: TSH 4.35 03/26/2015: TSH 4.37 09/28/2014: TSH 2.73  Pt is on levothyroxine 125 mcg daily, taken: - in am - fasting - at least 30 min from b'fast - no Ca, Fe, MVI, PPIs - not on Biotin  Vitamin D deficiency:  Reviewed latest vitamin D level: Normal Lab Results  Component Value Date   VD25OH 47.47 10/01/2016  Prev: 02/10/2016: vit D 31 08/06/2015: vit D 21  She continues on vitamin D 5000 units daily.  At last visit she was telling me that she had to see Dr. Brantley Stage for removal of a premalignant breast lesion.  She had breast lumpectomy with radioactive seed localization.  Her mother had Merkel Cell CA - stage 4. She passed away 02-26-18.  ROS: Constitutional: no weight  gain/no weight loss, no fatigue, no subjective hyperthermia, no subjective hypothermia Eyes: no blurry vision, no xerophthalmia ENT: no sore throat, no nodules palpated in neck, no dysphagia, no odynophagia, no hoarseness, + chronic nasal drainage - sanguinolent Cardiovascular: no CP/no SOB/no palpitations/no leg swelling Respiratory: no cough/no SOB/no wheezing Gastrointestinal: no N/no V/no D/no C/no acid reflux Musculoskeletal: no muscle aches/no joint aches Skin: no rashes, no hair loss Neurological + L hand tremors/no numbness/no tingling/no dizziness  I reviewed pt's medications, allergies, PMH, social hx, family hx, and changes were documented in the history of present illness. Otherwise, unchanged from my initial visit note.  Past Medical History:  Diagnosis Date  . Anemia   . Arthritis   . Controlled type 1 diabetes mellitus without complication ([place]) 11/91/4782  . Hyperlipidemia 06/22/2013  . Obesity, Class I, BMI 30-34.9 06/22/2013  . PONV (postoperative nausea and vomiting)   . Post menopausal syndrome 06/22/2013  . Primary hypothyroidism 06/22/2013  . Vitamin D deficiency 09/24/2013   Past Surgical History:  Procedure Laterality Date  . APPENDECTOMY    . BREAST LUMPECTOMY WITH RADIOACTIVE SEED LOCALIZATION Left 06/10/2018   Procedure: LEFT BREAST LUMPECTOMY WITH RADIOACTIVE SEED LOCALIZATION;  Surgeon: Erroll Luna, MD;  Location: [facility];  Service: General;  Laterality: Left;  . CESAREAN SECTION  1985  . TONSILLECTOMY     Social History   Social History  . Marital status: Married    Spouse name: N/A  . Number of children: 3   Occupational History  . Homemaker    Social History Main Topics  . Smoking status: Never Smoker  . Smokeless tobacco: Never Used  . Alcohol use No  . Drug use: No   Current Outpatient Medications on File Prior to Visit  Medication Sig Dispense Refill  . azelastine (ASTELIN) 0.1 % nasal spray Place 2 sprays into  both nostrils 2 (two) times daily. Use in each nostril as directed 30 mL 12  . Cholecalciferol (VITAMIN D3) 5000 units CAPS Take by mouth.    . CONTOUR NEXT TEST test strip TEST 6-7 TIMES PER DAY 100 each 5  . glucagon (GLUCAGON EMERGENCY) 1 MG injection Inject 1 mg into the muscle once as needed for up to 1 dose. 1 each 12  . ibuprofen (ADVIL) 800 MG tablet Take 1 tablet (800 mg total) by mouth every 8 (eight) hours as needed. 30 tablet 0  . insulin lispro (HUMALOG) 100 UNIT/ML injection USE AS DIRECTED PER PUMP TAKE MAXIMUM 50 UNITS DAILY 60 mL 1  . Insulin Pen Needle (PEN NEEDLES) 32G X 4 MM MISC 1 each  by Does not apply route daily. 100 each 11  . Insulin Syringe-Needle U-100 (INSULIN SYRINGE .3CC/31GX5/16") 31G X 5/16" 0.3 ML MISC Use to inject insulin if needed for back up of pump. 100 each 0  . levothyroxine (SYNTHROID, LEVOTHROID) 125 MCG tablet TAKE 1 TABLET BY MOUTH EVERY DAY 90 tablet 3  . naproxen (NAPROSYN) 500 MG tablet Take 1 tablet (500 mg total) by mouth 2 (two) times daily with a meal. 60 tablet 1  . pravastatin (PRAVACHOL) 40 MG tablet TAKE 1 TABLET BY MOUTH EVERY DAY 90 tablet 3  . ramipril (ALTACE) 10 MG capsule TAKE 1 CAPSULE BY MOUTH EVERY DAY 90 capsule 3  . vitamin B-12 (CYANOCOBALAMIN) 500 MCG tablet Take 1,000 mcg by mouth daily.     No current facility-administered medications on file prior to visit.    Allergies  Allergen Reactions  . Hydrocodone-Acetaminophen Nausea And Vomiting  . Propoxyphene Nausea Only  . Tape Rash   Family History  Problem Relation Age of Onset  . Hypertension Mother   . High Cholesterol Mother   . Coronary artery disease Mother 87       CABG  . Tuberculosis Father   . Heart attack Maternal Grandfather        Died from MI  . Coronary artery disease Maternal Uncle 75       CABG  . Lung cancer Maternal Uncle        asbestosis  . CAD Maternal Uncle 75       died - coronary aneurysm  . Hyperlipidemia Sister   . Hypertension Sister    . Hypothyroidism Sister   . Tuberculosis Paternal Grandmother   . Cancer Paternal Grandfather   . Hypothyroidism Sister    PE: BP 128/60   Pulse 84   Ht 5' 4"  (1.626 m)   Wt 188 lb (85.3 kg)   SpO2 98%   BMI 32.27 kg/m  Wt Readings from Last 3 Encounters:  09/01/18 188 lb (85.3 kg)  06/10/18 188 lb 4.4 oz (85.4 kg)  04/11/18 186 lb (84.4 kg)   Constitutional: overweight, in NAD Eyes: PERRLA, EOMI, no exophthalmos ENT: moist mucous membranes, no thyromegaly, no cervical lymphadenopathy, slight swelling mid forehead and also right upper side of nose - not painful on palpation; also, no sinus point tenderness; pink mucosa and right nostril, without discharge Cardiovascular: RRR, No MRG Respiratory: CTA B Gastrointestinal: abdomen soft, NT, ND, BS+ Musculoskeletal: no deformities, strength intact in all 4 Skin: moist, warm, no rashes Neurological: no tremor with outstretched hands, DTR normal in all 4  ASSESSMENT: 1. DM1, uncontrolled, without long term complications, but with hyperglycemia  2. Hypothyroidism  3. Thyroid nodule  4. Vit D def  5. Nasal drainage  - chronic  PLAN:  1. Patient with longstanding, uncontrolled, type 1 diabetes, on insulin pump.  She is usually doing a good job entering her carbs in the pump and bolusing for meals.  She had a period of time when she was not consistent with this practice during that time when her mother was sick at the beginning of the year, but this improved.  She also had problems with infusion sites in the past and now using the Medtronic pump on her arm. -She tried Mediterranean diet and vegan diet in the past but she could not continue with these.  At last visit she was doing a good job exercising but I advised her to use a 50% basal rate decrease during exercise and to continue  to do lower dose for now her after exercise to prevent low blood sugars.  At last visit, sugars were much better and she was walking after almost each of  her main meals after which she notices significant decrease in blood sugars.  As she was occasionally dropping her sugar slightly after exercise I advised her that when she exercised after breakfast to try to skip the insulin for coffee. -Additionally, we reviewed together her blood sugars at home which have improved.  She has no significant lows and only few sugars higher than 200.  She appears to have a pattern of controlled sugars in the morning, which then increased throughout the day.  We discussed that this is usually a sign of not enough mealtime coverage.  The increase in her sugar usually starts after lunch.  Reviewing her pump downloads (this will be scanned), after this meal, she has less consistent carb entries and we discussed that she absolutely needs to document her carbs with snacks and especially dinner.  She will start doing so.  I am also concerned that she may not enter all of her carbs with meals >> advised her to be careful with this.  She is also planning to start walking again after meals which will also help.  For these reasons, we will not change her insulin to carb ratios or the other pump settings for now.  Of note, her HbA1c was better today, at 7.5%!  However, in the last 2 weeks, her average was 150, corresponding to an HbA1c of 6.9%! - I advised her to:  Patient Instructions  Please continue: - basal rates: 12 AM: 0.775 units/h 5 AM: 0.950 9 AM: 0.775  - ICR: 1:10 - target: 100-120 - ISF: 40 - Active insulin time: 3 hours  Please enter the carbs for snacks and desserts. Also, enter 5-10 g carbs for coffee.  If you exercise, decrease the basal rate to 50% for the duration of exercise and 1 hour afterwards.  Please continue levothyroxine 125 mcg daily.  Take the thyroid hormone every day, with water, at least 30 minutes before breakfast, separated by at least 4 hours from: - acid reflux medications - calcium - iron - multivitamins  Please return in 3-4 months.    - advised to check sugars at different times of the day - 4x a day, rotating check times - advised for yearly eye exams >> she is not UTD - return to clinic in 3-4 months       2.  Hypothyroidism  - latest thyroid labs reviewed with pt >> normal 03/2018 - she continues on LT4 125 mcg daily - pt feels good on this dose. - we discussed about taking the thyroid hormone every day, with water, >30 minutes before breakfast, separated by >4 hours from acid reflux medications, calcium, iron, multivitamins. Pt. is taking it correctly. - will recheck her TFTs today  3. H/o Thyroid nodule -No neck compression symptoms -No nodules evident on the latest thyroid ultrasound -No further investigation needed for now  4.Vit D def -Continues on 5000 units vitamin D daily -Latest vitamin D level was normal in 09/2016 -Recheck level today  5. Chronic nasal drainage - x ~1 year - now noticed swelling around internal corner of R eye and also forehead - discharge often sanguinolent - cannot get to see PCP 2/2 sxs now during the coronavs pandemic - will refer to ENT  - time spent with the patient: 40 min, of which >50% was spent  in reviewing her pump downloads, discussing her glycemic patterns, reviewing previous labs and pump settings, developing a plan to avoid hypo- and hyper-glycemia; we also addressed her other endocrine disorders (see above).  She also wanted me to address her nasal drainage since she could not see PCP for this problem.  Component     Latest Ref Rng & Units 09/01/2018  Cholesterol     0 - 200 mg/dL 205 (H)  Triglycerides     0.0 - 149.0 mg/dL 127.0  HDL Cholesterol     >39.00 mg/dL 58.50  VLDL     0.0 - 40.0 mg/dL 25.4  LDL (calc)     0 - 99 mg/dL 121 (H)  Total CHOL/HDL Ratio      3  NonHDL      146.10  VITD     30.00 - 100.00 ng/mL 57.81  TSH     0.35 - 4.50 uIU/mL 2.99  T4,Free(Direct)     0.60 - 1.60 ng/dL 1.23  LDL higher than before.  We will check if she is  taking her atorvastatin consistently.  Glucose is high.  Rest of the labs are normal.  [name], MD PhD [facility] Endocrinology

## 2018-09-01 NOTE — Patient Instructions (Addendum)
Please continue: - basal rates: 12 AM: 0.775 units/h 5 AM: 0.950 9 AM: 0.775  - ICR: 1:10  - target: 100-120 - ISF: 50 - Active insulin time: 3 hours  Please enter the carbs for snacks and desserts.  Also, enter 5-10 g carbs for coffee.  If you exercise, decrease the basal rate to 50% for the duration of exercise and 1 hour afterwards.  Please continue levothyroxine 125 mcg daily.  Take the thyroid hormone every day, with water, at least 30 minutes before breakfast, separated by at least 4 hours from: - acid reflux medications - calcium - iron - multivitamins  Please return in 3-4 months.

## 2018-09-02 LAB — VITAMIN D 25 HYDROXY (VIT D DEFICIENCY, FRACTURES): VITD: 57.81 ng/mL (ref 30.00–100.00)

## 2018-09-02 LAB — TSH: TSH: 2.99 u[IU]/mL (ref 0.35–4.50)

## 2018-09-02 LAB — T4, FREE: Free T4: 1.23 ng/dL (ref 0.60–1.60)

## 2018-11-05 ENCOUNTER — Ambulatory Visit (INDEPENDENT_AMBULATORY_CARE_PROVIDER_SITE_OTHER): Payer: 59 | Admitting: Family Medicine

## 2018-11-05 ENCOUNTER — Other Ambulatory Visit: Payer: Self-pay

## 2018-11-05 ENCOUNTER — Encounter: Payer: Self-pay | Admitting: Neurology

## 2018-11-05 ENCOUNTER — Encounter: Payer: Self-pay | Admitting: Family Medicine

## 2018-11-05 VITALS — BP 122/70 | HR 85 | Temp 97.8°F | Ht 64.0 in | Wt 189.6 lb

## 2018-11-05 DIAGNOSIS — G252 Other specified forms of tremor: Secondary | ICD-10-CM | POA: Diagnosis not present

## 2018-11-05 DIAGNOSIS — Z23 Encounter for immunization: Secondary | ICD-10-CM | POA: Diagnosis not present

## 2018-11-05 LAB — CBC WITH DIFFERENTIAL/PLATELET
Basophils Absolute: 0.1 10*3/uL (ref 0.0–0.1)
Basophils Relative: 0.8 % (ref 0.0–3.0)
Eosinophils Absolute: 0.3 10*3/uL (ref 0.0–0.7)
Eosinophils Relative: 3.9 % (ref 0.0–5.0)
HCT: 40.6 % (ref 36.0–46.0)
Hemoglobin: 13.7 g/dL (ref 12.0–15.0)
Lymphocytes Relative: 25.1 % (ref 12.0–46.0)
Lymphs Abs: 2.1 10*3/uL (ref 0.7–4.0)
MCHC: 33.8 g/dL (ref 30.0–36.0)
MCV: 90.3 fl (ref 78.0–100.0)
Monocytes Absolute: 0.5 10*3/uL (ref 0.1–1.0)
Monocytes Relative: 6.1 % (ref 3.0–12.0)
Neutro Abs: 5.3 10*3/uL (ref 1.4–7.7)
Neutrophils Relative %: 64.1 % (ref 43.0–77.0)
Platelets: 280 10*3/uL (ref 150.0–400.0)
RBC: 4.5 Mil/uL (ref 3.87–5.11)
RDW: 13.3 % (ref 11.5–15.5)
WBC: 8.2 10*3/uL (ref 4.0–10.5)

## 2018-11-05 LAB — COMPREHENSIVE METABOLIC PANEL
ALT: 20 U/L (ref 0–35)
AST: 21 U/L (ref 0–37)
Albumin: 4.1 g/dL (ref 3.5–5.2)
Alkaline Phosphatase: 64 U/L (ref 39–117)
BUN: 17 mg/dL (ref 6–23)
CO2: 31 mEq/L (ref 19–32)
Calcium: 9.6 mg/dL (ref 8.4–10.5)
Chloride: 100 mEq/L (ref 96–112)
Creatinine, Ser: 0.95 mg/dL (ref 0.40–1.20)
GFR: 60.36 mL/min (ref >60.00)
Glucose, Bld: 219 mg/dL — ABNORMAL HIGH (ref 70–99)
Potassium: 4.9 mEq/L (ref 3.5–5.1)
Sodium: 136 mEq/L (ref 135–145)
Total Bilirubin: 0.4 mg/dL (ref 0.2–1.2)
Total Protein: 6.8 g/dL (ref 6.0–8.3)

## 2018-11-05 NOTE — Patient Instructions (Signed)
Labs today and I did referral to neurology as you have a resting tremor and would like them to do a better exam for parkinson's and following along.   Tremor A tremor is trembling or shaking that you cannot control. Most tremors affect the hands or arms. Tremors can also affect the head, vocal cords, face, and other parts of the body. There are many types of tremors. Common types include:  Essential tremor. These usually occur in people older than 40. It may run in families and can happen in otherwise healthy people.  Resting tremor. These occur when the muscles are at rest, such as when your hands are resting in your lap. People with Parkinson's disease often have resting tremors.  Postural tremor. These occur when you try to hold a pose, such as keeping your hands outstretched.  Kinetic tremor. These occur during purposeful movement, such as trying to touch a finger to your nose.  Task-specific tremor. These may occur when you perform certain tasks such as writing, speaking, or standing.  Psychogenic tremor. These dramatically lessen or disappear when you are distracted. They can happen in people of all ages. Some types of tremors have no known cause. Tremors can also be a symptom of nervous system problems (neurological disorders) that may occur with aging. Some tremors go away with treatment, while others do not. Follow these instructions at home: Lifestyle      Limit alcohol intake to no more than 1 drink a day for nonpregnant women and 2 drinks a day for men. One drink equals 12 oz of beer, 5 oz of wine, or 1 oz of hard liquor.  Do not use any products that contain nicotine or tobacco, such as cigarettes and e-cigarettes. If you need help quitting, ask your health care provider.  Avoid extreme heat and extreme cold.  Limit your caffeine intake, as told by your health care provider.  Try to get 8 hours of sleep each night.  Find ways to manage your stress, such as meditation or  yoga. General instructions  Take over-the-counter and prescription medicines only as told by your health care provider.  Keep all follow-up visits as told by your health care provider. This is important. Contact a health care provider if you:  Develop a tremor after starting a new medicine.  Have a tremor along with other symptoms such as: ? Numbness. ? Tingling. ? Pain. ? Weakness.  Notice that your tremor gets worse.  Notice that your tremor interferes with your day-to-day life. Summary  A tremor is trembling or shaking that you cannot control.  Most tremors affect the hands or arms.  Some types of tremors have no known cause. Others may be a symptom of nervous system problems (neurological disorders).  Make sure you discuss any tremors you have with your health care provider. This information is not intended to replace advice given to you by your health care provider. Make sure you discuss any questions you have with your health care provider. Document Released: 12/15/2001 Document Revised: 12/07/2016 Document Reviewed: 10/25/2016 Elsevier Patient Education  2020 [employer].

## 2018-11-05 NOTE — Progress Notes (Signed)
Patient: Chelsea Mercado MRN: [id number] DOB: 1960-09-06 PCP: Orma Flaming, MD     Subjective:  Chief Complaint  Patient presents with  . Transitions Of Care  . tremors in L hand    x approx 7 mos    HPI: The patient is a 58 y.o. female who presents today for transfer of care from Dr. Juleen China.  C/o tremors in left hands x several months. She states it will be resting on her knee and will start shaking. She can be holding her hand up and it will start shaking. If she looks at her hand it stops. She states it is progressively getting worse. No family history of tremors. NO tremors in her legs or other hand. She states the tremor doesn't get worse with intentional movement.  No PD in her family. She is right handed. May have dropped a thing or two, but can't recall. Gait is normal, strength normal and affect normal. She can grab her hand and stop the tremor as well. She has the tremor all day long. She feels like it's faster than what it used to be. She can hold her arms out and does not have a tremor   Review of Systems  Constitutional: Negative for fatigue.  Eyes: Negative for visual disturbance.  Respiratory: Negative for shortness of breath.   Cardiovascular: Negative for chest pain, palpitations and leg swelling.  Gastrointestinal: Negative for abdominal pain, diarrhea, nausea and vomiting.  Musculoskeletal: Positive for arthralgias.       C/o intermittent pain in R ankle  Skin: Negative for rash.  Neurological: Negative for dizziness and headaches.       C/o tremors in left hand x several months  Psychiatric/Behavioral: Negative for sleep disturbance.    Allergies Patient is allergic to hydrocodone-acetaminophen; propoxyphene; and tape.  Past Medical History Patient  has a past medical history of Anemia, Arthritis, Controlled type 1 diabetes mellitus without complication ([place]) ([id number]), Hyperlipidemia (06/22/2013), Obesity, Class I, BMI 30-34.9 (06/22/2013), PONV  (postoperative nausea and vomiting), Post menopausal syndrome (06/22/2013), Primary hypothyroidism (06/22/2013), and Vitamin D deficiency (09/24/2013).  Surgical History Patient  has a past surgical history that includes Cesarean section (1985); Appendectomy; Tonsillectomy; and Breast lumpectomy with radioactive seed localization (Left, 06/10/2018).  Family History Pateint's family history includes CAD (age of onset: 24) in her maternal uncle; Cancer in her paternal grandfather; Coronary artery disease (age of onset: 83) in her maternal uncle; Coronary artery disease (age of onset: 20) in her mother; Heart attack in her maternal grandfather; High Cholesterol in her mother; Hyperlipidemia in her sister; Hypertension in her mother and sister; Hypothyroidism in her sister and sister; Lung cancer in her maternal uncle; Tuberculosis in her father and paternal grandmother.  Social History Patient  reports that she has never smoked. She has never used smokeless tobacco. She reports that she does not drink alcohol or use drugs.    Objective: Vitals:   11/05/18 1049  BP: 122/70  Pulse: 85  Temp: 97.8 F (36.6 C)  TempSrc: Skin  Weight: 189 lb 9.6 oz (86 kg)  Height: 5\' 4"  (1.626 m)    Body mass index is 32.54 kg/m.  Physical Exam Vitals signs reviewed.  Constitutional:      Appearance: Normal appearance. She is normal weight.  HENT:     Head: Normocephalic and atraumatic.     Nose: Nose normal.     Mouth/Throat:     Mouth: Mucous membranes are moist.  Eyes:     Extraocular Movements: Extraocular  movements intact.     Conjunctiva/sclera: Conjunctivae normal.     Pupils: Pupils are equal, round, and reactive to light.  Neck:     Musculoskeletal: Normal range of motion and neck supple.     Vascular: No carotid bruit.  Cardiovascular:     Rate and Rhythm: Normal rate and regular rhythm.     Heart sounds: Normal heart sounds.  Abdominal:     General: Abdomen is flat. Bowel sounds are  normal.     Palpations: Abdomen is soft.  Neurological:     General: No focal deficit present.     Mental Status: She is alert and oriented to person, place, and time.     Cranial Nerves: No cranial nerve deficit.     Motor: No weakness.     Gait: Gait normal.     Deep Tendon Reflexes: Reflexes normal.     Comments: Normal finger to nose test, normal diadochokinesis, normal arm extension/hold with no tremor. She has a resting tremor of her left arm when resting on her left thigh. Tremor seen up into forearm.     Depression screen [facility] 2/9 11/05/2018 10/30/2016  Decreased Interest 0 0  Down, Depressed, Hopeless 0 0  PHQ - 2 Score 0 0  Altered sleeping 0 -  Tired, decreased energy 0 -  Change in appetite 0 -  Feeling bad or failure about yourself  1 -  Trouble concentrating 0 -  Moving slowly or fidgety/restless 0 -  Suicidal thoughts 0 -  PHQ-9 Score 1 -  Difficult doing work/chores Not difficult at all -       Assessment/plan: 1. Resting tremor Concern for resting tremor. Checking labs, r/o wilson's disease although very low on differential and discussed PD on differential. Referral to neuro for further eval.  - Ceruloplasmin - Comprehensive metabolic panel - CBC with Differential/Platelet - Ambulatory referral to Neurology  2. Need for immunization against influenza  - Flu Vaccine QUAD 36+ mos IM    Return if symptoms worsen or fail to improve.   Orma Flaming, MD [place]   11/05/2018

## 2018-11-06 LAB — CERULOPLASMIN: Ceruloplasmin: 37 mg/dL (ref 18–53)

## 2018-11-18 NOTE — Progress Notes (Signed)
Chelsea Mercado was seen today in the movement disorders clinic for neurologic consultation at the request of Orma Flaming, MD.  The consultation is for the evaluation of left hand tremor for approximately 7-8 months.  The records that were made available to me were reviewed.  Pt is R hand dominant   Specific Symptoms:  Tremor: Yes.  , L hand at rest in the lap.  Never with activation.  May notice it when talking on the phone.  Tremors with walking and with holding steering wheel Family hx of similar:  No. Voice: maybe hoarse or raspy Sleep: some trouble with staying asleep - "I get hot"  Vivid Dreams:  No.  Acting out dreams:  Only 2 times of speaking out Wet Pillows: Yes.   Postural symptoms:  No.  Falls?  No. Bradykinesia symptoms: difficulty getting out of a chair (but relates to "being fat");  Loss of smell:  "I'm not sure" Loss of taste:  Yes.  , intermittently Urinary Incontinence:  Yes.   stress incontinence Difficulty Swallowing:  No. Handwriting, micrographia: No. Trouble with ADL's:  No.  Trouble buttoning clothing: No. Depression:  No.  - just "stressed" Memory changes:  No. Hallucinations:  No.  visual distortions: No. N/V:  No. Lightheaded:  No.  Syncope: No. Diplopia:  No. Dyskinesia:  No. Prior exposure to reglan/antipsychotics: No.  Neuroimaging of the brain has not previously been performed.    PREVIOUS MEDICATIONS: none to date  ALLERGIES:   Allergies  Allergen Reactions  . Hydrocodone-Acetaminophen Nausea And Vomiting  . Propoxyphene Nausea Only  . Tape Rash    CURRENT MEDICATIONS:  Current Outpatient Medications  Medication Instructions  . azelastine (ASTELIN) 0.1 % nasal spray 2 sprays, Each Nare, 2 times daily, Use in each nostril as directed  . Cholecalciferol (VITAMIN D3) 5000 units CAPS Oral  . CONTOUR NEXT TEST test strip TEST 6-7 TIMES PER DAY  . glucagon (GLUCAGON EMERGENCY) 1 mg, Intramuscular, Once PRN  . ibuprofen (ADVIL) 800 mg,  Oral, Every 8 hours PRN  . insulin lispro (HUMALOG) 100 UNIT/ML injection USE AS DIRECTED PER PUMP TAKE MAXIMUM 50 UNITS DAILY  . Insulin Pen Needle (PEN NEEDLES) 32G X 4 MM MISC 1 each, Does not apply, Daily  . Insulin Syringe-Needle U-100 (INSULIN SYRINGE .3CC/31GX5/16") 31G X 5/16" 0.3 ML MISC Use to inject insulin if needed for back up of pump.  Marland Kitchen levothyroxine (SYNTHROID, LEVOTHROID) 125 MCG tablet TAKE 1 TABLET BY MOUTH EVERY DAY  . naproxen (NAPROSYN) 500 mg, Oral, 2 times daily with meals  . pravastatin (PRAVACHOL) 40 MG tablet TAKE 1 TABLET BY MOUTH EVERY DAY  . ramipril (ALTACE) 10 MG capsule TAKE 1 CAPSULE BY MOUTH EVERY DAY  . vitamin B-12 (CYANOCOBALAMIN) 1,000 mcg, Oral, Daily    PAST MEDICAL HISTORY:   Past Medical History:  Diagnosis Date  . Anemia   . Arthritis   . Controlled type 1 diabetes mellitus without complication ([place]) 35/24/8185  . Hyperlipidemia 06/22/2013  . Obesity, Class I, BMI 30-34.9 06/22/2013  . PONV (postoperative nausea and vomiting)   . Post menopausal syndrome 06/22/2013  . Primary hypothyroidism 06/22/2013  . Vitamin D deficiency 09/24/2013    PAST SURGICAL HISTORY:   Past Surgical History:  Procedure Laterality Date  . APPENDECTOMY    . BREAST LUMPECTOMY WITH RADIOACTIVE SEED LOCALIZATION Left 06/10/2018   Procedure: LEFT BREAST LUMPECTOMY WITH RADIOACTIVE SEED LOCALIZATION;  Surgeon: Erroll Luna, MD;  Location: [facility];  Service: General;  Laterality: Left;  . CESAREAN SECTION  1985  . TONSILLECTOMY      SOCIAL HISTORY:   Social History   Socioeconomic History  . Marital status: Married    Spouse name: Not on file  . Number of children: Not on file  . Years of education: Not on file  . Highest education level: Not on file  Occupational History  . Not on file  Social Needs  . Financial resource strain: Not on file  . Food insecurity    Worry: Not on file    Inability: Not on file  . Transportation needs     Medical: Not on file    Non-medical: Not on file  Tobacco Use  . Smoking status: Never Smoker  . Smokeless tobacco: Never Used  Substance and Sexual Activity  . Alcohol use: No  . Drug use: No  . Sexual activity: Yes    Partners: Male  Lifestyle  . Physical activity    Days per week: Not on file    Minutes per session: Not on file  . Stress: Not on file  Relationships  . Social Herbalist on phone: Not on file    Gets together: Not on file    Attends religious service: Not on file    Active member of club or organization: Not on file    Attends meetings of clubs or organizations: Not on file    Relationship status: Not on file  . Intimate partner violence    Fear of current or ex partner: Not on file    Emotionally abused: Not on file    Physically abused: Not on file    Forced sexual activity: Not on file  Other Topics Concern  . Not on file  Social History Narrative   Married mother of 3, grandmother 31.  Lives with her husband son and grandson.   Occasionally does yard work, but really never gets into any routine exercise.  Hoping to get into with exercise regimen at the gym.    FAMILY HISTORY:   Family Status  Relation Name Status  . Mother  Deceased at age 96  . Father  Deceased at age 36       Long-term side effect from TB  . MGF  Deceased  . Mat Uncle  Deceased  . Mat Uncle  Deceased at age 36  . Sister  Aloha Gell, age 55y  . MGM  Deceased  . PGM  (Not Specified)  . PGF  (Not Specified)  . Sister  Alive, age 69y    ROS:  Review of Systems  Constitutional: Negative.   HENT: Negative.   Eyes: Negative.   Cardiovascular: Negative.   Gastrointestinal: Negative.   Genitourinary: Negative.   Musculoskeletal: Positive for joint pain (feet).  Skin: Negative.   Neurological: Positive for tremors.  Endo/Heme/Allergies: Negative.     PHYSICAL EXAMINATION:    VITALS:   Vitals:   11/20/18 0859  BP: 134/81  Pulse: 90  Resp: 14  Temp: 98.2 F (36.8  C)  SpO2: 98%  Weight: 191 lb 9.6 oz (86.9 kg)  Height: 5' 4"  (1.626 m)    GEN:  The patient appears stated age and is in NAD. HEENT:  Normocephalic, atraumatic.  The mucous membranes are moist. The superficial temporal arteries are without ropiness or tenderness. CV:  RRR Lungs:  CTAB Neck/HEME:  There are no carotid bruits bilaterally.  Neurological examination:  Orientation: The patient is alert and oriented x3.  Fund of knowledge is appropriate.  Recent and remote memory are intact.  Attention and concentration are normal.    Able to name objects and repeat phrases. Cranial nerves: There is good facial symmetry.  Extraocular muscles are intact.  The patient is able to make the gutteral sounds without difficulty.The visual fields are full to confrontational testing. The speech is fluent and clear. Soft palate rises symmetrically and there is no tongue deviation. Hearing is intact to conversational tone. Sensation: Sensation is intact to light and pinprick throughout (facial, trunk, extremities). Vibration is intact at the bilateral big toe. There is no extinction with double simultaneous stimulation. There is no sensory dermatomal level identified. Motor: Strength is 5/5 in the bilateral upper and lower extremities.   Shoulder shrug is equal and symmetric.  There is no pronator drift. Deep tendon reflexes: Deep tendon reflexes are 3/4 at the bilateral biceps, triceps, brachioradialis, patella and achilles. Plantar responses are downgoing bilaterally.  Movement examination: Tone: There is mild increased tone in the LLE Abnormal movements: there is LUE rest tremor that increases with distraction Coordination:  There is  decremation with RAM's, with finger taps on the L and heel taps on the L Gait and Station: The patient has no difficulty arising out of a deep-seated chair without the use of the hands. The patient's stride length is good with mild decreased arm swing on the L.    Lab  Results  Component Value Date   TSH 2.99 09/01/2018     Chemistry      Component Value Date/Time   NA 136 11/05/2018 1131   K 4.9 11/05/2018 1131   CL 100 11/05/2018 1131   CO2 31 11/05/2018 1131   BUN 17 11/05/2018 1131   CREATININE 0.95 11/05/2018 1131   CREATININE 0.93 10/01/2016 1428      Component Value Date/Time   CALCIUM 9.6 11/05/2018 1131   ALKPHOS 64 11/05/2018 1131   AST 21 11/05/2018 1131   ALT 20 11/05/2018 1131   BILITOT 0.4 11/05/2018 1131     Lab Results  Component Value Date   HGBA1C 7.5 (A) 09/01/2018     ASSESSMENT/PLAN:  1.   idiopathic Parkinson's disease.  The patient has tremor, bradykinesia, rigidity and mild postural instability.  Dx: 11/20/18  -We discussed the diagnosis as well as pathophysiology of the disease.  We discussed treatment options as well as prognostic indicators.  Patient education was provided.  -We discussed that it used to be thought that levodopa would increase risk of melanoma but now it is believed that Parkinsons itself likely increases risk of melanoma. she is to get regular skin checks.  -Greater than 50% of the 60 minute visit was spent in counseling answering questions and talking about what to expect now as well as in the future.  We talked about medication options as well as potential future surgical options.  We talked about safety in the home.  -I think she would be a good candidate for a dopamine agonist and we cursorily discussed this.  However, the patient really was overwhelmed about the diagnosis, so we decided not to add anything today.  She needs to really be able to go home and process the diagnosis.  -We discussed the concept of multidisciplinary care.  I likely will refer her to the neuro rehab center next visit, but again she was very overwhelmed about the diagnosis.  -We discussed community resources in the area including patient support groups and community exercise programs for PD  and pt education was provided  to the patient.  -Patient met with my social worker today.  I will refer her for counseling as part of multidisciplinary care.  She was quite overwhelmed and had appropriate, reactive depression over the diagnosis.  Her father-in-law apparently was recently diagnosed with Parkinson's disease as well.  -Offered patient a second opinion.  She declined, but will think about it if she needs that.  2. Diabetes  -Not ideally controlled.  We talked about getting this under better control.  As above, we talked about the importance of safe, cardiovascular exercise.  Discussed weight control.  3.  I will plan on seeing the patient back in about 2 weeks, giving her time to process the diagnosis, and perhaps bring back a loved one (she came to the visit alone).  At that point in time, we will likely start her on pramipexole.   Cc:  Orma Flaming, MD

## 2018-11-19 ENCOUNTER — Ambulatory Visit: Payer: 59 | Admitting: Internal Medicine

## 2018-11-20 ENCOUNTER — Ambulatory Visit (INDEPENDENT_AMBULATORY_CARE_PROVIDER_SITE_OTHER): Payer: 59 | Admitting: Neurology

## 2018-11-20 ENCOUNTER — Other Ambulatory Visit: Payer: Self-pay

## 2018-11-20 ENCOUNTER — Ambulatory Visit (INDEPENDENT_AMBULATORY_CARE_PROVIDER_SITE_OTHER): Payer: 59 | Admitting: Clinical

## 2018-11-20 ENCOUNTER — Encounter: Payer: Self-pay | Admitting: Neurology

## 2018-11-20 VITALS — BP 134/81 | HR 90 | Temp 98.2°F | Resp 14 | Ht 64.0 in | Wt 191.6 lb

## 2018-11-20 DIAGNOSIS — F329 Major depressive disorder, single episode, unspecified: Secondary | ICD-10-CM

## 2018-11-20 DIAGNOSIS — G20A1 Parkinson's disease without dyskinesia, without mention of fluctuations: Secondary | ICD-10-CM | POA: Insufficient documentation

## 2018-11-20 DIAGNOSIS — G20 Parkinson's disease: Secondary | ICD-10-CM

## 2018-11-20 DIAGNOSIS — E1065 Type 1 diabetes mellitus with hyperglycemia: Secondary | ICD-10-CM | POA: Diagnosis not present

## 2018-11-20 NOTE — Patient Instructions (Signed)
The physicians and staff at [facility] are committed to providing excellent care. You may receive a survey requesting feedback about your experience at our office. We strive to receive "very good" responses to the survey questions. If you feel that your experience would prevent you from giving the office a "very good " response, please contact our office to try to remedy the situation. We may be reached at [phone]. Thank you for taking the time out of your busy day to complete the survey.  

## 2018-11-24 ENCOUNTER — Telehealth: Payer: Self-pay | Admitting: Physical Therapy

## 2018-11-24 DIAGNOSIS — G20 Parkinson's disease: Secondary | ICD-10-CM

## 2018-11-24 NOTE — Telephone Encounter (Signed)
Please let her know I placed referral to [facility] as requested. im glad we sent her to neurology, but im sorry they think it's Parkinson's. She will be in good hands at [facility].  Let me know if I can do anything.  D.r Rogers Blocker

## 2018-11-24 NOTE — Addendum Note (Signed)
Addended by: Orma Flaming on: 11/24/2018 03:50 PM   Modules accepted: Orders

## 2018-11-24 NOTE — Telephone Encounter (Signed)
Copied from [place] [phone]. Topic: Referral - Request for Referral >> Nov 24, 2018  8:56 AM Chelsea Mercado wrote: Has patient seen PCP for this complaint? Yes Parkinson's disease  Referral for which specialty: neurology Preferred provider/office: [facility] Reason for referral: Parkinson's disease

## 2018-11-25 NOTE — Telephone Encounter (Signed)
Spoke to patient and advised of notes per Dr. Rogers Blocker.  She verbalized understanding.

## 2018-12-03 ENCOUNTER — Ambulatory Visit: Payer: 59 | Admitting: Family Medicine

## 2018-12-16 ENCOUNTER — Ambulatory Visit: Payer: 59 | Admitting: Neurology

## 2018-12-17 NOTE — BH Specialist Note (Signed)
Referring Provider: Alonza Bogus, DO Date of Referral: 11/20/2018 Primary Reason for Referral: stress reaction  Location of Visit: Individual, office visit  Suicide/Homicide Risk: Pt denies risk  Assessment Patient presents today for psychoeducation with LCSW following with new diagnosis of Parkinson's Disease by referring provider Dr. Wells Guiles Tat. Pt presents with acute stress reaction following new dx of PD, pt tearful and hypervigilant throughout visit, marked difficultly regulating breath. LCSW guided pt in breath exercise to reduce reactivity.  LCSW provided supportive counseling as pt details fear re diagnosis, notably fear of loss of independence. Pt acknowledges she does not know anyone living well with PD. Her FIL recently dx however given sx description it sounds like possible atypical state. LCSW provided pt education re various diseases under Parkinson umbrella and discussed disease course of standard PD including nonmotor Parkinson's symptoms such as apathy, depression, incontinence/constipation, sleep behavior disorders, communication and cognitive impairment.   LCSW provided pt with information about our support and educational groups for patients with Parkinson's as well as their care partners, also discussed the importance of forced intense exercise in the management of PD and provided information about current exercise opportunities. Also discussed availability of individual counseling sessions to address the adjustment of living with chronic disease of Parkinson's, invited patient to schedule with LCSW as desired. Pt responded receptively to patient education today and supportive counseling today.   Brief Interventions provided today in session 1. psychoeducation, patient education 2. Supportive counseling   Plan 1. Read "Newly Diagnosed"/ FAQs ([place]), Information provided to pt. Contact LCSW with any questions related to Parkinson's & behavioral health  2. Goal  for exercise  Behavioral Health treatment recommendations communicated to referring provider and pt states agreement with plan. LCSW will remain available for future consultation.

## 2018-12-19 ENCOUNTER — Ambulatory Visit: Payer: 59 | Admitting: Neurology

## 2018-12-19 ENCOUNTER — Institutional Professional Consult (permissible substitution): Payer: 59 | Admitting: Clinical

## 2018-12-27 ENCOUNTER — Other Ambulatory Visit: Payer: Self-pay | Admitting: Internal Medicine

## 2018-12-29 ENCOUNTER — Encounter: Payer: Self-pay | Admitting: Internal Medicine

## 2018-12-29 ENCOUNTER — Other Ambulatory Visit: Payer: Self-pay

## 2018-12-29 ENCOUNTER — Ambulatory Visit (INDEPENDENT_AMBULATORY_CARE_PROVIDER_SITE_OTHER): Payer: 59 | Admitting: Internal Medicine

## 2018-12-29 VITALS — BP 120/62 | HR 85 | Ht 64.0 in | Wt 188.0 lb

## 2018-12-29 DIAGNOSIS — E041 Nontoxic single thyroid nodule: Secondary | ICD-10-CM | POA: Diagnosis not present

## 2018-12-29 DIAGNOSIS — E1069 Type 1 diabetes mellitus with other specified complication: Secondary | ICD-10-CM

## 2018-12-29 DIAGNOSIS — E1065 Type 1 diabetes mellitus with hyperglycemia: Secondary | ICD-10-CM

## 2018-12-29 DIAGNOSIS — E039 Hypothyroidism, unspecified: Secondary | ICD-10-CM

## 2018-12-29 DIAGNOSIS — E782 Mixed hyperlipidemia: Secondary | ICD-10-CM

## 2018-12-29 LAB — POCT GLYCOSYLATED HEMOGLOBIN (HGB A1C): Hemoglobin A1C: 7.8 % — AB (ref 4.0–5.6)

## 2018-12-29 NOTE — Telephone Encounter (Signed)
Patient has appointment today

## 2018-12-29 NOTE — Progress Notes (Signed)
Patient ID: Chelsea Mercado, female   DOB: 1960-11-05, 58 y.o.   MRN: [id number]   This visit occurred during the SARS-CoV-2 public health emergency.  Safety protocols were in place, including screening questions prior to the visit, additional usage of staff PPE, and extensive cleaning of exam room while observing appropriate contact time as indicated for disinfecting solutions.   HPI: Chelsea Mercado is a 58 y.o.-year-old female, returning for f/u for DM1, dx'ed at 58 y/o, uncontrolled, without long term complications and hypothyroidism. She saw endocrinology before: Dr. Cherie Dark.  Last visit with me 4 months ago.  Reviewed HbA1c levels: Lab Results  Component Value Date   HGBA1C 7.5 (A) 09/01/2018   HGBA1C 8.0 (A) 12/09/2017   HGBA1C 7.9 (A) 08/06/2017  02/14/2016: HbA1c 7.6%  She continues on insulin pump: Medtronic 670 G - on her arm -started 2016-2017, new pump from 01/2017. She is not wearing the CGM since she does not have enough places on her abdomen that she can use. She has Humalog in the pump.  Pump settings: - basal rates: 12 AM: 0.775 units/h 5 AM: 0.950 9 AM: 0.775  - ICR: 1:10 - target: 100-120 - ISF: 40 >> 50 - Active insulin time: 3 hours Please enter the carbs for snacks and desserts. Also, enter 5 to 10 g of carbs for coffee. If you exercise, decrease the basal rate to 50% for the duration of exercise and 1 hour afterwards.  - bolus wizard: on TDD from basal insulin: 51% >> 52% (20 units) >> 60% (19 units) TDD from bolus insulin: 41% >> 38% (12 units) >> 40% (13 units) Total daily dose: Up to 50 units - changes infusion site: q 3 days  - Meter: Bayer Contour  Pt checks her sugars 5x a day - ave 150 +/- 59 >> 169 +/-57:   Lowest sugar was  67 >> 62 >> 74; she has hypoglycemia awareness in the 60s.  She had 1 previous ED visit for hypoglycemia many years ago.  She has a nonexpired glucometer at home. Highest sugar was 556 (site pbs), 340 (site pb) >> 254 >> 293 >> 293.   No previous DKA admissions.  Pt's meals are: - Breakfast: egg, bread, milk; milk + cereals; coffee - Lunch: Sandwich, soup, salad, fruit, chips; milk - Dinner: Meat, potatoes, brown rice, veggies or salad - Snacks: 1 She was walking 2-3 times a day (after meals) up to 1 hour a day, but not currently.  She plans to restart this.  -No CKD, last BUN/creatinine:  Lab Results  Component Value Date   BUN 17 11/05/2018   BUN 12 06/06/2018   CREATININE 0.95 11/05/2018   CREATININE 0.89 06/06/2018   On ramipril 10. -+ HL. Last set of lipids: Lab Results  Component Value Date   CHOL 205 (H) 09/01/2018   HDL 58.50 09/01/2018   LDLCALC 121 (H) 09/01/2018   TRIG 127.0 09/01/2018   CHOLHDL 3 09/01/2018   On atorvastatin 40. - last eye exam was in 05/2018: No DR - Dr. Prudencio Burly -No numbness and tingling in her feet.  Pt has no FH of DM.  Hypothyroidism:  TFTs were reviewed: Lab Results  Component Value Date   TSH 2.99 09/01/2018  02/10/2016: TSH 6.671 08/06/2015: TSH 4.35 03/26/2015: TSH 4.37 09/28/2014: TSH 2.73  Pt is on levothyroxine 125 mcg daily, taken: - in am - fasting - at least 30 min from b'fast - no Ca, Fe, MVI, PPIs - not on Biotin  She has  a history of vitamin D deficiency but latest levels were normal: Lab Results  Component Value Date   VD25OH 57.81 09/01/2018   VD25OH 47.47 10/01/2016  Prev: 02/10/2016: vit D 31 08/06/2015: vit D 21  She continues on vitamin D 5000 units daily.  She saw Dr. Brantley Stage for removal of a premalignant breast lesion.  She had breast lumpectomy with radioactive seed localization.  Her mother had Merkel Cell CA - stage 4. She passed away 02/20/2018.  ROS: Constitutional: no weight gain/no weight loss, no fatigue, no subjective hyperthermia, no subjective hypothermia Eyes: no blurry vision, no xerophthalmia ENT: no sore throat, no nodules palpated in neck, no dysphagia, no odynophagia, no hoarseness Cardiovascular: no CP/no SOB/no  palpitations/no leg swelling Respiratory: no cough/no SOB/no wheezing Gastrointestinal: no N/no V/no D/no C/no acid reflux Musculoskeletal: no muscle aches/no joint aches Skin: no rashes, no hair loss Neurological: L hand tremor /no numbness/no tingling/no dizziness  I reviewed pt's medications, allergies, PMH, social hx, family hx, and changes were documented in the history of present illness. Otherwise, unchanged from my initial visit note.  Past Medical History:  Diagnosis Date  . Anemia   . Arthritis   . Controlled type 1 diabetes mellitus without complication ([place]) [id number]  . Hyperlipidemia 06/22/2013  . Obesity, Class I, BMI 30-34.9 06/22/2013  . PONV (postoperative nausea and vomiting)   . Post menopausal syndrome 06/22/2013  . Primary hypothyroidism 06/22/2013  . Vitamin D deficiency 09/24/2013   Past Surgical History:  Procedure Laterality Date  . APPENDECTOMY    . BREAST LUMPECTOMY WITH RADIOACTIVE SEED LOCALIZATION Left 06/10/2018   Procedure: LEFT BREAST LUMPECTOMY WITH RADIOACTIVE SEED LOCALIZATION;  Surgeon: Erroll Luna, MD;  Location: [facility];  Service: General;  Laterality: Left;  . CESAREAN SECTION  1985  . TONSILLECTOMY     Social History   Social History  . Marital status: Married    Spouse name: N/A  . Number of children: 3   Occupational History  . Homemaker    Social History Main Topics  . Smoking status: Never Smoker  . Smokeless tobacco: Never Used  . Alcohol use No  . Drug use: No   Current Outpatient Medications on File Prior to Visit  Medication Sig Dispense Refill  . azelastine (ASTELIN) 0.1 % nasal spray Place 2 sprays into both nostrils 2 (two) times daily. Use in each nostril as directed 30 mL 12  . Cholecalciferol (VITAMIN D3) 5000 units CAPS Take by mouth.    . CONTOUR NEXT TEST test strip TEST 6-7 TIMES PER DAY 100 each 5  . glucagon (GLUCAGON EMERGENCY) 1 MG injection Inject 1 mg into the muscle once as needed  for up to 1 dose. 1 each 12  . ibuprofen (ADVIL) 800 MG tablet Take 1 tablet (800 mg total) by mouth every 8 (eight) hours as needed. 30 tablet 0  . insulin lispro (HUMALOG) 100 UNIT/ML injection USE AS DIRECTED PER PUMP TAKE MAXIMUM 50 UNITS DAILY 60 mL 1  . Insulin Pen Needle (PEN NEEDLES) 32G X 4 MM MISC 1 each by Does not apply route daily. 100 each 11  . Insulin Syringe-Needle U-100 (INSULIN SYRINGE .3CC/31GX5/16") 31G X 5/16" 0.3 ML MISC Use to inject insulin if needed for back up of pump. 100 each 0  . levothyroxine (SYNTHROID, LEVOTHROID) 125 MCG tablet TAKE 1 TABLET BY MOUTH EVERY DAY 90 tablet 3  . naproxen (NAPROSYN) 500 MG tablet Take 1 tablet (500 mg total) by mouth 2 (two)  times daily with a meal. 60 tablet 1  . pravastatin (PRAVACHOL) 40 MG tablet TAKE 1 TABLET BY MOUTH EVERY DAY 90 tablet 3  . ramipril (ALTACE) 10 MG capsule TAKE 1 CAPSULE BY MOUTH EVERY DAY 90 capsule 3  . vitamin B-12 (CYANOCOBALAMIN) 500 MCG tablet Take 1,000 mcg by mouth daily.     No current facility-administered medications on file prior to visit.   Allergies  Allergen Reactions  . Hydrocodone-Acetaminophen Nausea And Vomiting  . Propoxyphene Nausea Only  . Tape Rash   Family History  Problem Relation Age of Onset  . Hypertension Mother   . High Cholesterol Mother   . Coronary artery disease Mother 94       CABG  . Tuberculosis Father   . Heart attack Maternal Grandfather        Died from MI  . Coronary artery disease Maternal Uncle 75       CABG  . Lung cancer Maternal Uncle        asbestosis  . CAD Maternal Uncle 56       died - coronary aneurysm  . Hyperlipidemia Sister   . Hypertension Sister   . Hypothyroidism Sister   . Tuberculosis Paternal Grandmother   . Cancer Paternal Grandfather   . Hypothyroidism Sister    PE: BP 120/62   Pulse 85   Ht 5\' 4"  (1.626 m)   Wt 188 lb (85.3 kg)   SpO2 98%   BMI 32.27 kg/m  Wt Readings from Last 3 Encounters:  12/29/18 188 lb (85.3 kg)    11/20/18 191 lb 9.6 oz (86.9 kg)  11/05/18 189 lb 9.6 oz (86 kg)   Constitutional: overweight, in NAD Eyes: PERRLA, EOMI, no exophthalmos ENT: moist mucous membranes, no thyromegaly, no cervical lymphadenopathy Cardiovascular: RRR, No MRG Respiratory: CTA B Gastrointestinal: abdomen soft, NT, ND, BS+ Musculoskeletal: no deformities, strength intact in all 4 Skin: moist, warm, no rashes Neurological: + tremor with outstretched hands, DTR normal in all 4  ASSESSMENT: 1. DM1, uncontrolled, without long term complications, but with hyperglycemia  2. Hypothyroidism  3. Thyroid nodule  4.  Hyperlipidemia  PLAN:  1. Patient with longstanding, uncontrolled, type 1 diabetes, on insulin pump.  She had problems with infusion site in the past and now has the Medtronic pump on her arm. -In the past, she tried Mediterranean and even diet but she could not continue with these.   -With exercise, we discussed about using 50% basal rate for the duration of exercise and 1 hour afterwards as she was dropping her sugars after exercise in the past.  She also continues to bolus for coffee-enters 5 to 10 g of carbs in the pump -unless she exercises after coffee. -Last visit, she appeared to have a pattern of controlled sugars in the morning but increasing during the day, a sign of not enough mealtime coverage.  At that time, she had less consistent carb entries and we discussed that she absolutely needs to document also the carbs also, with snacks and desserts.  We did not change her settings then.  Her HbA1c was better, at 7.5% -Reviewing her pump downloads, it still appears that she does not introduce all of the carbs that she eats into the pump, and subsequently, her sugars are higher after meals.  She usually introduces approximately 30 g of carbs per meal, but, regained some of her meals, the amount should be higher.  For now, I advised him to try to introduce all of  the carbs into the pump and then, if  his sugars remain high, to go on insulin to carb ratio validation (given instructions about how to do this) and we may need to change her insulin to carb ratio with meals if this is abnormal. -Otherwise, reviewing her pump downloads, blood sugars are very variable in the morning, which I think relates to her blood sugars and correction for these at bedtime.  For now, I would not suggest to change her basal rates at night but we may need to do so at next visit. - I advised her to:  Patient Instructions  Please continue: - basal rates: 12 AM: 0.775 units/h 5 AM: 0.950 9 AM: 0.775  - ICR: 1:10 - target: 100-120 - ISF: 50 - Active insulin time: 3 hours  Please enter the carbs for snacks and desserts. Also, enter 10-20 g of carbs for coffee. If you exercise, decrease the basal rate to 50% for the duration of exercise and 1 hour afterwards.  Please enter all carbs into the pump.  If sugars do not improve, do an insulin to carb ratio validation. Start when sugars <100, bolus for a fixed carb meals, check in 2 hours. If sugars are >30 or <30 from the starting value, you need to adjust the ICR.  Please continue levothyroxine 125 mcg daily.  Take the thyroid hormone every day, with water, at least 30 minutes before breakfast, separated by at least 4 hours from: - acid reflux medications - calcium - iron - multivitamins  Please return in 3-4 months.   - we checked her HbA1c: 7.8% (higher) - advised to check sugars at different times of the day - 4x a day, rotating check times - advised for yearly eye exams >> she is UTD - return to clinic in 3-4 months       2.  Hypothyroidism  - latest thyroid labs reviewed with pt >> normal at last visit: Lab Results  Component Value Date   TSH 2.99 09/01/2018   - she continues on LT4 125 mcg daily - pt feels good on this dose. - we discussed about taking the thyroid hormone every day, with water, >30 minutes before breakfast, separated by >4  hours from acid reflux medications, calcium, iron, multivitamins. Pt. is taking it correctly.  3. H/o Thyroid nodule -She denies any neck compression symptoms -No nodules evident on the latest thyroid ultrasound -No further investigation needed for now  4. HL - Reviewed latest lipid panel from last visit: LDL higher, above target Lab Results  Component Value Date   CHOL 205 (H) 09/01/2018   HDL 58.50 09/01/2018   LDLCALC 121 (H) 09/01/2018   TRIG 127.0 09/01/2018   CHOLHDL 3 09/01/2018  - Continues the statin without side effects.  - time spent with the patient: 40 min, of which >50% was spent in reviewing her pump downloads, discussing her  hyper-glycemic episodes, reviewing previous labs and pump settings and developing a plan to avoid hypo- and hyper-glycemia.  We also addressed her other endocrine problems.  [name], MD PhD [facility] Endocrinology

## 2018-12-29 NOTE — Patient Instructions (Addendum)
Please continue: - basal rates: 12 AM: 0.775 units/h 5 AM: 0.950 9 AM: 0.775  - ICR: 1:10 - target: 100-120 - ISF: 50 - Active insulin time: 3 hours  Please enter the carbs for snacks and desserts. Also, enter 10-20 g of carbs for coffee. If you exercise, decrease the basal rate to 50% for the duration of exercise and 1 hour afterwards.  Please enter all carbs into the pump.  If sugars do not improve, do an insulin to carb ratio validation. Start when sugars <100, bolus for a fixed carb meals, check in 2 hours. If sugars are >30 or <30 from the starting value, you need to adjust the ICR.  Please continue levothyroxine 125 mcg daily.  Take the thyroid hormone every day, with water, at least 30 minutes before breakfast, separated by at least 4 hours from: - acid reflux medications - calcium - iron - multivitamins  Please return in 3-4 months.

## 2018-12-30 ENCOUNTER — Other Ambulatory Visit: Payer: Self-pay

## 2018-12-31 ENCOUNTER — Other Ambulatory Visit (HOSPITAL_COMMUNITY)
Admission: RE | Admit: 2018-12-31 | Discharge: 2018-12-31 | Disposition: A | Discharge status: Home / Self Care | Payer: 59 | Source: Ambulatory Visit | Attending: Family Medicine | Admitting: Family Medicine

## 2018-12-31 ENCOUNTER — Ambulatory Visit (INDEPENDENT_AMBULATORY_CARE_PROVIDER_SITE_OTHER): Payer: 59 | Admitting: Family Medicine

## 2018-12-31 ENCOUNTER — Encounter: Payer: Self-pay | Admitting: Family Medicine

## 2018-12-31 VITALS — BP 118/68 | HR 68 | Temp 97.8°F | Ht 64.0 in | Wt 190.6 lb

## 2018-12-31 DIAGNOSIS — Z01419 Encounter for gynecological examination (general) (routine) without abnormal findings: Secondary | ICD-10-CM | POA: Diagnosis not present

## 2018-12-31 DIAGNOSIS — Z0000 Encounter for general adult medical examination without abnormal findings: Secondary | ICD-10-CM | POA: Diagnosis not present

## 2018-12-31 NOTE — Progress Notes (Signed)
Patient: Chelsea Mercado MRN: [id number] DOB: June 25, 1960 PCP: Orma Flaming, MD     Subjective:  Chief Complaint  Patient presents with  . Annual Exam    HPI: The patient is a 58 y.o. female who presents today for annual exam. She denies any changes to past medical history. There have been no recent hospitalizations. They are following a well balanced diet and exercise plan. Weight has been stable.   No family history of breast or colon cancer in first degree relative.   GYN Menarche at age 71 years. She had very heavy periods that lasted 7 days. She had associated nausea and cramping. She was tried on OCP and she could not tolerate it due to nausea/vomiting. No history of abnormal pap smears. No hx of STDs. Menopause at age 67 years of age. She has had her mmg this year in March and had a pappiloma cyst with excision on June 2nd. utd on colonoscopy. She is sexually active with her husband. No pain with sex. No frequent UTis. No vaginal complaints. She does have a prolapsed bladder. Bone scan in 2 years.   Immunization History  Administered Date(s) Administered  . Influenza Split 10/09/2014  . Influenza,inj,Quad PF,6+ Mos 09/24/2013, 10/30/2016, 11/04/2017, 11/05/2018  . Pneumococcal Polysaccharide-23 11/19/2016   Colonoscopy: 10/2010 Mammogram: 03/2018 Pap smear: 02/2015   Review of Systems  Constitutional: Negative for chills, fatigue and fever.  HENT: Negative for dental problem, ear pain, hearing loss and trouble swallowing.   Eyes: Negative for visual disturbance.  Respiratory: Negative for cough, chest tightness and shortness of breath.   Cardiovascular: Negative for chest pain, palpitations and leg swelling.  Gastrointestinal: Negative for abdominal pain, blood in stool, diarrhea and nausea.  Endocrine: Negative for cold intolerance, polydipsia, polyphagia and polyuria.  Genitourinary: Negative for dysuria and hematuria.  Musculoskeletal: Negative for arthralgias.  Skin:  Positive for rash.       Rash on left inner thigh  Neurological: Negative for dizziness and headaches.  Psychiatric/Behavioral: Negative for dysphoric mood and sleep disturbance. The patient is not nervous/anxious.     Allergies Patient is allergic to hydrocodone-acetaminophen; propoxyphene; and tape.  Past Medical History Patient  has a past medical history of Anemia, Arthritis, Controlled type 1 diabetes mellitus without complication ([place]) ([id number]), Hyperlipidemia (06/22/2013), Obesity, Class I, BMI 30-34.9 (06/22/2013), PONV (postoperative nausea and vomiting), Post menopausal syndrome (06/22/2013), Primary hypothyroidism (06/22/2013), and Vitamin D deficiency (09/24/2013).  Surgical History Patient  has a past surgical history that includes Cesarean section (1985); Appendectomy; Tonsillectomy; and Breast lumpectomy with radioactive seed localization (Left, 06/10/2018).  Family History Pateint's family history includes CAD (age of onset: 34) in her maternal uncle; Cancer in her paternal grandfather; Coronary artery disease (age of onset: 37) in her maternal uncle; Coronary artery disease (age of onset: 59) in her mother; Heart attack in her maternal grandfather; High Cholesterol in her mother; Hyperlipidemia in her sister; Hypertension in her mother and sister; Hypothyroidism in her sister and sister; Lung cancer in her maternal uncle; Tuberculosis in her father and paternal grandmother.  Social History Patient  reports that she has never smoked. She has never used smokeless tobacco. She reports that she does not drink alcohol or use drugs.    Objective: Vitals:   12/31/18 0809  BP: 118/68  Pulse: 68  Temp: 97.8 F (36.6 C)  TempSrc: Skin  SpO2: 98%  Weight: 190 lb 9.6 oz (86.5 kg)  Height: 5\' 4"  (1.626 m)    Body mass index is 32.72 kg/m.  Physical Exam Vitals reviewed. Exam conducted with a chaperone present.  Constitutional:      Appearance: Normal appearance. She is  well-developed. She is obese.  HENT:     Head: Normocephalic and atraumatic.     Right Ear: Tympanic membrane, ear canal and external ear normal.     Left Ear: Tympanic membrane, ear canal and external ear normal.     Nose: Nose normal.     Mouth/Throat:     Mouth: Mucous membranes are moist.  Eyes:     Extraocular Movements: Extraocular movements intact.     Conjunctiva/sclera: Conjunctivae normal.     Pupils: Pupils are equal, round, and reactive to light.  Neck:     Thyroid: No thyromegaly.     Vascular: No carotid bruit.  Cardiovascular:     Rate and Rhythm: Normal rate and regular rhythm.     Pulses: Normal pulses.     Heart sounds: Normal heart sounds. No murmur.  Pulmonary:     Effort: Pulmonary effort is normal.     Breath sounds: Normal breath sounds.  Chest:     Breasts:        Right: Normal.        Left: Normal.  Abdominal:     General: Bowel sounds are normal. There is no distension.     Palpations: Abdomen is soft.     Tenderness: There is no abdominal tenderness.  Genitourinary:    General: Normal vulva.     Vagina: No vaginal discharge.     Rectum: Normal.     Comments: Small cystocele  Normal bimanuel exam  Musculoskeletal:     Cervical back: Normal range of motion and neck supple.  Lymphadenopathy:     Cervical: No cervical adenopathy.     Upper Body:     Right upper body: No axillary adenopathy.     Left upper body: No axillary adenopathy.  Skin:    General: Skin is warm and dry.     Capillary Refill: Capillary refill takes less than 2 seconds.     Findings: No rash.  Neurological:     General: No focal deficit present.     Mental Status: She is alert and oriented to person, place, and time.     Cranial Nerves: No cranial nerve deficit.     Coordination: Coordination normal.     Deep Tendon Reflexes: Reflexes normal.  Psychiatric:        Mood and Affect: Mood normal.        Behavior: Behavior normal.      Office Visit from 12/31/2018 in  [place]  PHQ-2 Total Score  0         Assessment/plan: 1. Annual physical exam Routine labs done in 10/2018 by endocrinology. Everything checked. Hepc/hiv have also been done and utd. UTD on HM. Does need her diabetic foot exam. Discussed I could do this at her next visit. Encouraged exercise/diet. F/u in one year or as needed.  Patient counseling [x]    Nutrition: Stressed importance of moderation in sodium/caffeine intake, saturated fat and cholesterol, caloric balance, sufficient intake of fresh fruits, vegetables, fiber, calcium, iron, and 1 mg of folate supplement per day (for females capable of pregnancy).  [x]    Stressed the importance of regular exercise.   []    Substance Abuse: Discussed cessation/primary prevention of tobacco, alcohol, or other drug use; driving or other dangerous activities under the influence; availability of treatment for abuse.   [x]   Injury prevention: Discussed safety belts, safety helmets, smoke detector, smoking near bedding or upholstery.   [x]    Sexuality: Discussed sexually transmitted diseases, partner selection, use of condoms, avoidance of unintended pregnancy  and contraceptive alternatives.  [x]    Dental health: Discussed importance of regular tooth brushing, flossing, and dental visits.  [x]    Health maintenance and immunizations reviewed. Please refer to Health maintenance section.     2. Well woman exam with routine gynecological exam  - Cytology - PAP( [place])    This visit occurred during the SARS-CoV-2 public health emergency.  Safety protocols were in place, including screening questions prior to the visit, additional usage of staff PPE, and extensive cleaning of exam room while observing appropriate contact time as indicated for disinfecting solutions.     Return in about 1 year (around 12/31/2019).     Orma Flaming, MD [facility]  12/31/2018

## 2018-12-31 NOTE — Patient Instructions (Addendum)
Health Maintenance  Topic Date Due  . FOOT EXAM  01/07/2018  . PAP SMEAR-Modifier  02/21/2018  . OPHTHALMOLOGY EXAM  06/06/2019  . HEMOGLOBIN A1C  06/29/2019  . MAMMOGRAM  03/26/2020  . COLONOSCOPY  10/10/2020  . TETANUS/TDAP  01/09/2023  . INFLUENZA VACCINE  Completed  . PNEUMOCOCCAL POLYSACCHARIDE VACCINE AGE 58-64 HIGH RISK  Completed  . Hepatitis C Screening  Completed  . HIV Screening  Completed     Preventive Care 74-20 Years Old, Female Preventive care refers to visits with your health care provider and lifestyle choices that can promote health and wellness. This includes:  A yearly physical exam. This may also be called an annual well check.  Regular dental visits and eye exams.  Immunizations.  Screening for certain conditions.  Healthy lifestyle choices, such as eating a healthy diet, getting regular exercise, not using drugs or products that contain nicotine and tobacco, and limiting alcohol use. What can I expect for my preventive care visit? Physical exam Your health care provider will check your:  Height and weight. This may be used to calculate body mass index (BMI), which tells if you are at a healthy weight.  Heart rate and blood pressure.  Skin for abnormal spots. Counseling Your health care provider may ask you questions about your:  Alcohol, tobacco, and drug use.  Emotional well-being.  Home and relationship well-being.  Sexual activity.  Eating habits.  Work and work Statistician.  Method of birth control.  Menstrual cycle.  Pregnancy history. What immunizations do I need?  Influenza (flu) vaccine  This is recommended every year. Tetanus, diphtheria, and pertussis (Tdap) vaccine  You may need a Td booster every 10 years. Varicella (chickenpox) vaccine  You may need this if you have not been vaccinated. Zoster (shingles) vaccine  You may need this after age 107. Measles, mumps, and rubella (MMR) vaccine  You may need at least  one dose of MMR if you were born in 1957 or later. You may also need a second dose. Pneumococcal conjugate (PCV13) vaccine  You may need this if you have certain conditions and were not previously vaccinated. Pneumococcal polysaccharide (PPSV23) vaccine  You may need one or two doses if you smoke cigarettes or if you have certain conditions. Meningococcal conjugate (MenACWY) vaccine  You may need this if you have certain conditions. Hepatitis A vaccine  You may need this if you have certain conditions or if you travel or work in places where you may be exposed to hepatitis A. Hepatitis B vaccine  You may need this if you have certain conditions or if you travel or work in places where you may be exposed to hepatitis B. Haemophilus influenzae type b (Hib) vaccine  You may need this if you have certain conditions. Human papillomavirus (HPV) vaccine  If recommended by your health care provider, you may need three doses over 6 months. You may receive vaccines as individual doses or as more than one vaccine together in one shot (combination vaccines). Talk with your health care provider about the risks and benefits of combination vaccines. What tests do I need? Blood tests  Lipid and cholesterol levels. These may be checked every 5 years, or more frequently if you are over 36 years old.  Hepatitis C test.  Hepatitis B test. Screening  Lung cancer screening. You may have this screening every year starting at age 52 if you have a 30-pack-year history of smoking and currently smoke or have quit within the past  15 years.  Colorectal cancer screening. All adults should have this screening starting at age 25 and continuing until age 32. Your health care provider may recommend screening at age 86 if you are at increased risk. You will have tests every 1-10 years, depending on your results and the type of screening test.  Diabetes screening. This is done by checking your blood sugar (glucose)  after you have not eaten for a while (fasting). You may have this done every 1-3 years.  Mammogram. This may be done every 1-2 years. Talk with your health care provider about when you should start having regular mammograms. This may depend on whether you have a family history of breast cancer.  BRCA-related cancer screening. This may be done if you have a family history of breast, ovarian, tubal, or peritoneal cancers.  Pelvic exam and Pap test. This may be done every 3 years starting at age 62. Starting at age 43, this may be done every 5 years if you have a Pap test in combination with an HPV test. Other tests  Sexually transmitted disease (STD) testing.  Bone density scan. This is done to screen for osteoporosis. You may have this scan if you are at high risk for osteoporosis. Follow these instructions at home: Eating and drinking  Eat a diet that includes fresh fruits and vegetables, whole grains, lean protein, and low-fat dairy.  Take vitamin and mineral supplements as recommended by your health care provider.  Do not drink alcohol if: ? Your health care provider tells you not to drink. ? You are pregnant, may be pregnant, or are planning to become pregnant.  If you drink alcohol: ? Limit how much you have to 0-1 drink a day. ? Be aware of how much alcohol is in your drink. In the [place] one drink equals one 12 oz bottle of beer (355 mL), one 5 oz glass of wine (148 mL), or one 1 oz glass of hard liquor (44 mL). Lifestyle  Take daily care of your teeth and gums.  Stay active. Exercise for at least 30 minutes on 5 or more days each week.  Do not use any products that contain nicotine or tobacco, such as cigarettes, e-cigarettes, and chewing tobacco. If you need help quitting, ask your health care provider.  If you are sexually active, practice safe sex. Use a condom or other form of birth control (contraception) in order to prevent pregnancy and STIs (sexually transmitted  infections).  If told by your health care provider, take low-dose aspirin daily starting at age 23. What's next?  Visit your health care provider once a year for a well check visit.  Ask your health care provider how often you should have your eyes and teeth checked.  Stay up to date on all vaccines. This information is not intended to replace advice given to you by your health care provider. Make sure you discuss any questions you have with your health care provider. Document Released: 01/21/2015 Document Revised: 09/05/2017 Document Reviewed: 09/05/2017 Elsevier Patient Education  2020 [employer].

## 2019-01-06 LAB — CYTOLOGY - PAP
Comment: NEGATIVE
Diagnosis: NEGATIVE
High risk HPV: NEGATIVE

## 2019-01-29 ENCOUNTER — Other Ambulatory Visit: Payer: Self-pay | Admitting: Family Medicine

## 2019-02-02 NOTE — Telephone Encounter (Signed)
Please review

## 2019-02-03 ENCOUNTER — Telehealth: Payer: Self-pay | Admitting: Internal Medicine

## 2019-02-03 MED ORDER — CONTOUR NEXT TEST VI STRP: ORAL_STRIP | 5 refills | Status: DC

## 2019-02-03 NOTE — Telephone Encounter (Signed)
RX sent

## 2019-02-03 NOTE — Telephone Encounter (Signed)
MEDICATION: Contour Next Test Strips  PHARMACY:  [facility] [place]  IS THIS A 90 DAY SUPPLY : YES  IS PATIENT OUT OF MEDICATION:   IF NOT; HOW MUCH IS LEFT: 2-3 days worth  LAST APPOINTMENT DATE: @12 /21/2020  NEXT APPOINTMENT DATE:@4 /22/2021  DO WE HAVE YOUR PERMISSION TO LEAVE A DETAILED MESSAGE: yes  OTHER COMMENTS:    **Let patient know to contact pharmacy at the end of the day to make sure medication is ready. **  ** Please notify patient to allow 48-72 hours to process**  **Encourage patient to contact the pharmacy for refills or they can request refills through [facility]**

## 2019-02-06 ENCOUNTER — Telehealth: Payer: Self-pay

## 2019-02-06 NOTE — Telephone Encounter (Signed)
Forms for pump supplies filled out, signed by Dr. Cruzita Lederer and faxed to [employer] with confirmation.

## 2019-02-16 ENCOUNTER — Other Ambulatory Visit: Payer: Self-pay | Admitting: Internal Medicine

## 2019-03-10 NOTE — Progress Notes (Signed)
Virtual Visit via Video Note The purpose of this virtual visit is to provide medical care while limiting exposure to the novel coronavirus.    Consent was obtained for video visit:  Yes.   Answered questions that patient had about telehealth interaction:  Yes.   I discussed the limitations, risks, security and privacy concerns of performing an evaluation and management service by telemedicine. I also discussed with the patient that there may be a patient responsible charge related to this service. The patient expressed understanding and agreed to proceed.  Pt location: Home Physician Location: office Name of referring provider:  Orma Flaming, MD I connected with Chelsea Mercado at patients initiation/request on 03/12/2019 at  3:30 PM EST by video enabled telemedicine application and verified that I am speaking with the correct person using two identifiers. Pt MRN:  [id number] Pt DOB:  04-21-1960 Video Participants:  Chelsea Mercado;     History of Present Illness:  Patient seen today in follow-up for newly diagnosed Parkinson's disease.  She was diagnosed when I last saw her in November.  She was understandably overwhelmed about the diagnosis.  I wanted to see her back 2 weeks after I saw her so that she had time to process the diagnosis, but she canceled her follow-up visit.  She canceled her visit with my Parkinson Education officer, museum as well.  She did seek an opinion at [facility] on February 1.  They agreed with the diagnosis.  Only issue that she really is having is tremor.  She is doing balance exercise.  She is also walking for exercise.  No lightheadedness.  Mood has been okay.  No falls. Overall feeling well and better than when I last saw her from a physical standpoint.     Current Outpatient Medications on File Prior to Visit  Medication Sig Dispense Refill  . azelastine (ASTELIN) 0.1 % nasal spray Place 2 sprays into both nostrils 2 (two) times daily. Use in each nostril as directed 30 mL 12  .  Cholecalciferol (VITAMIN D3) 5000 units CAPS Take 1 capsule by mouth daily.     Marland Kitchen glucagon (GLUCAGON EMERGENCY) 1 MG injection Inject 1 mg into the muscle once as needed for up to 1 dose. 1 each 12  . glucose blood (CONTOUR NEXT TEST) test strip TEST 6-7 TIMES PER DAY 100 each 5  . ibuprofen (ADVIL) 800 MG tablet Take 1 tablet (800 mg total) by mouth every 8 (eight) hours as needed. 30 tablet 0  . insulin lispro (HUMALOG) 100 UNIT/ML injection USE AS DIRECTED PER PUMP TAKE MAXIMUM 50 UNITS DAILY 60 mL 1  . Insulin Pen Needle (PEN NEEDLES) 32G X 4 MM MISC 1 each by Does not apply route daily. 100 each 11  . Insulin Syringe-Needle U-100 (INSULIN SYRINGE .3CC/31GX5/16") 31G X 5/16" 0.3 ML MISC Use to inject insulin if needed for back up of pump. 100 each 0  . levothyroxine (SYNTHROID) 125 MCG tablet TAKE 1 TABLET BY MOUTH EVERY DAY 90 tablet 3  . naproxen (NAPROSYN) 500 MG tablet Take 1 tablet (500 mg total) by mouth 2 (two) times daily with a meal. 60 tablet 1  . pravastatin (PRAVACHOL) 40 MG tablet TAKE 1 TABLET BY MOUTH EVERY DAY 90 tablet 3  . ramipril (ALTACE) 10 MG capsule TAKE 1 CAPSULE BY MOUTH EVERY DAY 90 capsule 3  . vitamin B-12 (CYANOCOBALAMIN) 500 MCG tablet Take 1,000 mcg by mouth daily.     No current facility-administered medications on file prior to  visit.     Observations/Objective:   Vitals:   03/12/19 0829  Weight: 185 lb (83.9 kg)  Height: 5\' 4"  (1.626 m)   GEN:  The patient appears stated age and is in NAD.  Neurological examination:  Orientation: The patient is alert and oriented x3. Cranial nerves: There is good facial symmetry. There is no significant facial hypomimia.  The speech is fluent and clear. Soft palate rises symmetrically and there is no tongue deviation. Hearing is intact to conversational tone. Motor: Strength is at least antigravity x 4.   Shoulder shrug is equal and symmetric.  There is no pronator drift.  Movement examination: Tone:  unable Abnormal movements: there is LUE rest tremor  Coordination:  There is no decremation with RAM's, with hand opening and closing her finger taps bilaterally.  Was not able to see her real well either, however, and did not see her feet. Gait and Station: Patient was not able to position her camera to allow me to watch her ambulate.    Assessment and Plan:   1.  Parkinson's disease, diagnosed November, 2020.  -Patient had a second opinion at [facility] in February, 2021.  They agreed with the diagnosis.  -We discussed medical treatments.  I do think she would be a good candidate for dopamine agonist therapy, but ultimately after some discussion, she decided to hold off on medications.  -We did talk about the importance of safe, cardiovascular exercise.  We also talked about the importance of multidisciplinary care and exactly what that was.  Follow Up Instructions:  6 months  -I discussed the assessment and treatment plan with the patient. The patient was provided an opportunity to ask questions and all were answered. The patient agreed with the plan and demonstrated an understanding of the instructions.   The patient was advised to call back or seek an in-person evaluation if the symptoms worsen or if the condition fails to improve as anticipated.    Total time spent on today's visit was 30 minutes, including both face-to-face time and nonface-to-face time.  Time included that spent on review of records (prior notes available to me/labs/imaging if pertinent), discussing treatment and goals, answering patient's questions and coordinating care.   Alonza Bogus, DO

## 2019-03-12 ENCOUNTER — Telehealth (INDEPENDENT_AMBULATORY_CARE_PROVIDER_SITE_OTHER): Payer: 59 | Admitting: Neurology

## 2019-03-12 ENCOUNTER — Other Ambulatory Visit: Payer: Self-pay

## 2019-03-12 ENCOUNTER — Encounter: Payer: Self-pay | Admitting: Neurology

## 2019-03-12 VITALS — Ht 64.0 in | Wt 185.0 lb

## 2019-03-12 DIAGNOSIS — G20 Parkinson's disease: Secondary | ICD-10-CM | POA: Diagnosis not present

## 2019-04-13 ENCOUNTER — Telehealth: Payer: Self-pay | Admitting: Internal Medicine

## 2019-04-13 NOTE — Telephone Encounter (Signed)
Faxed again to alt number. If does not go through this time it will get mailed.

## 2019-04-13 NOTE — Telephone Encounter (Signed)
[employer] called again stating she keeps getting only cover page - gave an alternative fax# of [phone]

## 2019-04-13 NOTE — Telephone Encounter (Signed)
Faxed again

## 2019-04-13 NOTE — Telephone Encounter (Signed)
[facility] called stating they got the fax for this patient but they ONLY got the cover page - requesting to resend. Fax# [phone]

## 2019-04-23 ENCOUNTER — Telehealth: Payer: Self-pay

## 2019-04-23 ENCOUNTER — Other Ambulatory Visit: Payer: Self-pay

## 2019-04-23 DIAGNOSIS — G20 Parkinson's disease: Secondary | ICD-10-CM

## 2019-04-23 NOTE — Progress Notes (Signed)
Orders placed.

## 2019-04-23 NOTE — Telephone Encounter (Signed)
Okay for referral for PT at [facility] Okay for referral for neurocog testing for memory loss with Drs Nicole Kindred or Melvyn Novas

## 2019-04-23 NOTE — Telephone Encounter (Signed)
Pt called requesting referral to [facility] & pt desires cognitive testing in this office. Please call pt [phone].

## 2019-04-24 NOTE — Telephone Encounter (Signed)
Pt declined the referral to Melvyn Novas or Nicole Kindred due to her insurance running out at the end of May. They are both booked up until 06-18-19. She said thank you for doing the referral

## 2019-04-26 ENCOUNTER — Other Ambulatory Visit: Payer: Self-pay | Admitting: Internal Medicine

## 2019-04-30 ENCOUNTER — Other Ambulatory Visit: Payer: Self-pay

## 2019-04-30 ENCOUNTER — Ambulatory Visit: Payer: 59 | Admitting: Internal Medicine

## 2019-04-30 ENCOUNTER — Encounter: Payer: Self-pay | Admitting: Internal Medicine

## 2019-04-30 VITALS — BP 120/62 | HR 79 | Ht 64.0 in | Wt 189.0 lb

## 2019-04-30 DIAGNOSIS — E041 Nontoxic single thyroid nodule: Secondary | ICD-10-CM | POA: Diagnosis not present

## 2019-04-30 DIAGNOSIS — E782 Mixed hyperlipidemia: Secondary | ICD-10-CM

## 2019-04-30 DIAGNOSIS — E1065 Type 1 diabetes mellitus with hyperglycemia: Secondary | ICD-10-CM | POA: Diagnosis not present

## 2019-04-30 DIAGNOSIS — E039 Hypothyroidism, unspecified: Secondary | ICD-10-CM | POA: Diagnosis not present

## 2019-04-30 DIAGNOSIS — E1069 Type 1 diabetes mellitus with other specified complication: Secondary | ICD-10-CM

## 2019-04-30 LAB — POCT GLYCOSYLATED HEMOGLOBIN (HGB A1C): Hemoglobin A1C: 8 % — AB (ref 4.0–5.6)

## 2019-04-30 MED ORDER — GLUCAGON 3 MG/DOSE NA POWD: 3.0000 mg | Freq: Once | NASAL | 11 refills | Status: DC | PRN

## 2019-04-30 MED ORDER — ACCU-CHEK GUIDE VI STRP: ORAL_STRIP | 5 refills | Status: DC

## 2019-04-30 NOTE — Addendum Note (Signed)
Addended by: Cardell Peach I on: 04/30/2019 11:24 AM   Modules accepted: Orders

## 2019-04-30 NOTE — Patient Instructions (Addendum)
Please continue: - basal rates: 12 AM: 0.775 units/h 2 AM: 0.775 >> 0.800 5 AM: 0.950 9 AM: 0.775  - ICR: 1:10 - target: 100-120 - ISF: 50 - Active insulin time: 3 hours  Please enter all carbs into the pump, including snacks and desserts.  BOLUS 15 MIN BEFORE A MEAL.  Also, enter 10-20 g of carbs for coffee. If you exercise, decrease the basal rate to 50% for the duration of exercise and 1 hour afterwards.  Please continue levothyroxine 125 mcg daily.  Take the thyroid hormone every day, with water, at least 30 minutes before breakfast, separated by at least 4 hours from: - acid reflux medications - calcium - iron - multivitamins  Please return in 3-4 months.

## 2019-04-30 NOTE — Progress Notes (Signed)
Patient ID: Chelsea Mercado, female   DOB: 1960/05/03, 59 y.o.   MRN: [id number]   This visit occurred during the SARS-CoV-2 public health emergency.  Safety protocols were in place, including screening questions prior to the visit, additional usage of staff PPE, and extensive cleaning of exam room while observing appropriate contact time as indicated for disinfecting solutions.   HPI: Chelsea Mercado is a 59 y.o.-year-old female, returning for f/u for DM1, dx'ed at 59 y/o, uncontrolled, without long term complications and hypothyroidism. She saw endocrinology before: Dr. Cherie Mercado.  Last visit with me 4 months ago.  Reviewed her HbA1c levels: Lab Results  Component Value Date   HGBA1C 7.8 (A) 12/29/2018   HGBA1C 7.5 (A) 09/01/2018   HGBA1C 8.0 (A) 12/09/2017  02/14/2016: HbA1c 7.6%  She continues on insulin pump: Medtronic 630 G- on her arm -started 2016-2017, newer pump started 01/2017. She changed to 770G since 04/30/2019. She is not wearing the CGM since she does not have enough places on her abdomen that she can use. She has Humalog in the pump.  Pump settings: Please continue: - basal rates: 12 AM: 0.775 units/h 5 AM: 0.950 9 AM: 0.775  - ICR: 1:10 - target: 100-120 - ISF: 50 - Active insulin time: 3 hours  - bolus wizard: on TDD from basal insulin: 51% >> 52% (20 units) >> 60% (19 units) >> 60% TDD from bolus insulin: 41% >> 38% (12 units) >> 40% (13 units) >> 40% Total daily dose: 31.7-50 units - changes infusion site: q 3.7 days  - Meter: Bayer Contour  Pt checks her sugars 5x a day - ave 150 +/- 59 >> 169 +/-57 >> 149 +/-55:   Prev.:   Lowest sugar was  67 >> 62 >> 74 >> 58; she has hypoglycemia awareness in the 60s.  She had 1 previous ED visit for hypoglycemia many years ago.  She has an unexpired glucagon kit at home. Highest sugar was 556 (infusion site pbs) ...>> 293 >> 300.  No previous DKA admissions.  Pt's meals are: - Breakfast: egg, bread, milk; milk + cereals;  coffee - Lunch: Sandwich, soup, salad, fruit, chips; milk - Dinner: Meat, potatoes, brown rice, veggies or salad - Snacks: 1 She was walking 2-3 times a day (after meals) up to 1 hour a day, but stopped during the coronavirus pandemic.  At last visit she was telling me that she was planning to restart his.  She just started exercise.  -No CKD, last BUN/creatinine:  Lab Results  Component Value Date   BUN 17 11/05/2018   BUN 12 06/06/2018   CREATININE 0.95 11/05/2018   CREATININE 0.89 06/06/2018   On ramipril 10. -+ HL. Last set of lipids: Lab Results  Component Value Date   CHOL 205 (H) 09/01/2018   HDL 58.50 09/01/2018   LDLCALC 121 (H) 09/01/2018   TRIG 127.0 09/01/2018   CHOLHDL 3 09/01/2018   On atorvastatin 40. - last eye exam was in 05/2018: No DR- Dr. Prudencio Mercado -She denies numbness and tingling in her feet.  Pt has no FH of DM.  Hypothyroidism:  TFTs reviewed: Lab Results  Component Value Date   TSH 2.99 09/01/2018  02/10/2016: TSH 6.671 08/06/2015: TSH 4.35 03/26/2015: TSH 4.37 09/28/2014: TSH 2.73  Pt is on levothyroxine 125 mcg daily, taken: - in am - fasting - at least 30 min from b'fast - no Mercado, Fe, MVI, PPIs - not on Biotin  She has a history of vitamin D deficiency but  latest levels were normal Lab Results  Component Value Date   VD25OH 57.81 09/01/2018   VD25OH 47.47 10/01/2016  Prev: 02/10/2016: vit D 31 08/06/2015: vit D 21  She continues on vitamin D 5000 units daily.  She saw Dr. Brantley Mercado for removal of a premalignant breast lesion.  She had breast lumpectomy with radioactive seed localization.  Her mother had Chelsea Mercado - Mercado 4. She passed away 2018/02/12.  ROS: Constitutional: no weight gain/no weight loss, no fatigue, no subjective hyperthermia, no subjective hypothermia Eyes: no blurry vision, no xerophthalmia ENT: no sore throat, no nodules palpated in neck, no dysphagia, no odynophagia, no hoarseness Cardiovascular: no CP/no  SOB/no palpitations/no leg swelling Respiratory: no cough/no SOB/no wheezing Gastrointestinal: no N/no V/no D/no C/no acid reflux Musculoskeletal: no muscle aches/no joint aches Skin: no rashes, no hair loss Neurological: + tremors/no numbness/no tingling/no dizziness  I reviewed pt's medications, allergies, PMH, social hx, family hx, and changes were documented in the history of present illness. Otherwise, unchanged from my initial visit note.  Past Medical History:  Diagnosis Date  . Anemia   . Arthritis   . Controlled type 1 diabetes mellitus without complication ([place]) [id number]/[id number]  . Hyperlipidemia 06/22/2013  . Obesity, Class I, BMI 30-34.9 06/22/2013  . PONV (postoperative nausea and vomiting)   . Post menopausal syndrome 06/22/2013  . Primary hypothyroidism 06/22/2013  . Vitamin D deficiency 09/24/2013   Past Surgical History:  Procedure Laterality Date  . APPENDECTOMY    . BREAST LUMPECTOMY WITH RADIOACTIVE SEED LOCALIZATION Left 06/10/2018   Procedure: LEFT BREAST LUMPECTOMY WITH RADIOACTIVE SEED LOCALIZATION;  Surgeon: Chelsea Luna, MD;  Location: [facility];  Service: General;  Laterality: Left;  . CESAREAN SECTION  1985  . TONSILLECTOMY     Social History   Social History  . Marital status: Married    Spouse name: N/A  . Number of children: 3   Occupational History  . Homemaker    Social History Main Topics  . Smoking status: Never Smoker  . Smokeless tobacco: Never Used  . Alcohol use No  . Drug use: No   Current Outpatient Medications on File Prior to Visit  Medication Sig Dispense Refill  . azelastine (ASTELIN) 0.1 % nasal spray Place 2 sprays into both nostrils 2 (two) times daily. Use in each nostril as directed 30 mL 12  . Cholecalciferol (VITAMIN D3) 5000 units CAPS Take 1 capsule by mouth daily.     Marland Kitchen glucagon (GLUCAGON EMERGENCY) 1 MG injection Inject 1 mg into the muscle once as needed for up to 1 dose. 1 each 12  . glucose blood  (CONTOUR NEXT TEST) test strip TEST 4 TIMES PER DAY 400 strip 2  . ibuprofen (ADVIL) 800 MG tablet Take 1 tablet (800 mg total) by mouth every 8 (eight) hours as needed. 30 tablet 0  . insulin lispro (HUMALOG) 100 UNIT/ML injection USE AS DIRECTED PER PUMP TAKE MAXIMUM 50 UNITS DAILY 60 mL 1  . Insulin Pen Needle (PEN NEEDLES) 32G X 4 MM MISC 1 each by Does not apply route daily. 100 each 11  . Insulin Syringe-Needle U-100 (INSULIN SYRINGE .3CC/31GX5/16") 31G X 5/16" 0.3 ML MISC Use to inject insulin if needed for back up of pump. 100 each 0  . levothyroxine (SYNTHROID) 125 MCG tablet TAKE 1 TABLET BY MOUTH EVERY DAY 90 tablet 3  . naproxen (NAPROSYN) 500 MG tablet Take 1 tablet (500 mg total) by mouth 2 (two) times daily with a  meal. 60 tablet 1  . pravastatin (PRAVACHOL) 40 MG tablet TAKE 1 TABLET BY MOUTH EVERY DAY 90 tablet 3  . ramipril (ALTACE) 10 MG capsule TAKE 1 CAPSULE BY MOUTH EVERY DAY 90 capsule 3  . vitamin B-12 (CYANOCOBALAMIN) 500 MCG tablet Take 1,000 mcg by mouth daily.     No current facility-administered medications on file prior to visit.   Allergies  Allergen Reactions  . Hydrocodone-Acetaminophen Nausea And Vomiting  . Propoxyphene Nausea Only  . Tape Rash   Family History  Problem Relation Age of Onset  . Hypertension Mother   . High Cholesterol Mother   . Coronary artery disease Mother 33       CABG  . Tuberculosis Father   . Heart attack Maternal Grandfather        Died from MI  . Coronary artery disease Maternal Uncle 75       CABG  . Lung cancer Maternal Uncle        asbestosis  . CAD Maternal Uncle 60       died - coronary aneurysm  . Hyperlipidemia Sister   . Hypertension Sister   . Hypothyroidism Sister   . Tuberculosis Paternal Grandmother   . Cancer Paternal Grandfather   . Hypothyroidism Sister    PE: BP 120/62   Pulse 79   Ht 5' 4" (1.626 m)   Wt 189 lb (85.7 kg)   SpO2 97%   BMI 32.44 kg/m  Wt Readings from Last 3 Encounters:    04/30/19 189 lb (85.7 kg)  03/12/19 185 lb (83.9 kg)  12/31/18 190 lb 9.6 oz (86.5 kg)   Constitutional: overweight, in NAD Eyes: PERRLA, EOMI, no exophthalmos ENT: moist mucous membranes, no thyromegaly, no cervical lymphadenopathy Cardiovascular: RRR, No MRG Respiratory: CTA B Gastrointestinal: abdomen soft, NT, ND, BS+ Musculoskeletal: no deformities, strength intact in all 4 Skin: moist, warm, no rashes Neurological: + tremor with outstretched hands, DTR normal in all 4  ASSESSMENT: 1. DM1, uncontrolled, without long term complications, but with hyperglycemia  2. Hypothyroidism  3. Thyroid nodule  4.  Hyperlipidemia  PLAN:  1. Patient with longstanding, uncontrolled, type 1 diabetes, on insulin pump.  She had problems with infusion sites in the past and now she wears her Medtronic pump on her arm. -In the past, she tried a Mediterranean or even a vegan diet but she could not continue with these. -At last visit, her HbA1c was 7.8%, increased.  At that time, reviewing her pump downloads, she had very variable blood sugars in the morning and it appeared that she did not introduce all of the carbs that she ate into the pump and subsequently sugars were higher after meals.  She mostly introduced approximately 30 g of carbs per meal but reviewing some of her meals, the amount should have been higher.  I advised her to try her best to enter all of the carbs in the pump including snacks and desserts, and then if the sugars remain high, to do an insulin to carb ratio validation.  I gave her instructions about how to do this.  I advised her that if the sugars increase will increase by more than 30 mg/dL, we may need to change her insulin to carb ratios.  Otherwise, we did not change the settings at last visit. -At this visit, we reviewed her insulin pump downloads.  These are still for her previous pump, 630 G, however, she was able to change her pump yesterday to a 770  G. -The sugars appear  variable in the morning, mostly above target and they are better later in the day.  She still has high blood sugars after certain meals especially when she introduces the carbs into the pump and starts bolusing at the time of her meal or when she skips introducing the carbs completely.  Also, she occasionally introduces less carbs into the pump and sugars are high after a correct bolus.  At this visit, we discussed about improving the time when she is bolusing.  She tells me that in the past, she used to weigh 15 minutes between a bolus and a meal, but now she is bolusing at the start of the meal.  I advised her to move the insulin boluses back 15 minutes before the meal.  Also, she needs to introduce all the carbs in her pump.  Lastly, she ideally would not forget to enter any carbs in the pump. -Since her sugars are higher in the morning, we discussed about increasing her basal rates overnight.  I advised her to increase the basal rates from 2 AM to 5 AM slightly. -Otherwise, I do not feel that she needs any changes in her regimen.  She did have few low blood sugars, but not consistently - lowest 58.  I sent a prescription for inhaled glucagon to her pharmacy. -I advised her to:  Patient Instructions  Please continue: - basal rates: 12 AM: 0.775 units/h 2 AM: 0.775 >> 0.800 5 AM: 0.950 9 AM: 0.775  - ICR: 1:10 - target: 100-120 - ISF: 50 - Active insulin time: 3 hours  Please enter all carbs into the pump, including snacks and desserts.  BOLUS 15 MIN BEFORE A MEAL.  Also, enter 10-20 g of carbs for coffee. If you exercise, decrease the basal rate to 50% for the duration of exercise and 1 hour afterwards.  Please continue levothyroxine 125 mcg daily.  Take the thyroid hormone every day, with water, at least 30 minutes before breakfast, separated by at least 4 hours from: - acid reflux medications - calcium - iron - multivitamins  Please return in 3-4 months.   - we checked her  HbA1c: 8% (higher) - advised to check sugars at different times of the day - 4x a day, rotating check times - advised for yearly eye exams >> she is UTD - return to clinic in 3-4 months       2.  Hypothyroidism  - latest thyroid labs reviewed with pt >> normal: Lab Results  Component Value Date   TSH 2.99 09/01/2018   - she continues on LT4 125 mcg daily - pt feels good on this dose. - we discussed about taking the thyroid hormone every day, with water, >30 minutes before breakfast, separated by >4 hours from acid reflux medications, calcium, iron, multivitamins. Pt. is taking it correctly. - will check thyroid tests at next visit  3. H/o Thyroid nodule -No neck compression symptoms -This is resolved on the latest thyroid ultrasound -No further investigation needed for now  4. HL -Reviewed latest lipid panel from 08/2018: LDL still above target, the rest of the fractions are at goal: Lab Results  Component Value Date   CHOL 205 (H) 09/01/2018   HDL 58.50 09/01/2018   LDLCALC 121 (H) 09/01/2018   TRIG 127.0 09/01/2018   CHOLHDL 3 09/01/2018  -Continues the statin without side effects  She tells me that this may be our last visit since she and her husband may need to  move from the area is her husband lost his previous job due to business closings.  - time spent with the patient: 40 min, of which >50% was spent in reviewing her pump  downloads, discussing her hypo- and hyper-glycemic episodes, reviewing previous labs and pump settings and developing a plan to avoid hypo- and hyper-glycemia.  We also addressed her other endocrine problems.   [name], MD PhD [facility] Endocrinology

## 2019-05-28 ENCOUNTER — Ambulatory Visit: Payer: 59 | Attending: Neurology | Admitting: Physical Therapy

## 2019-05-28 ENCOUNTER — Other Ambulatory Visit: Payer: Self-pay

## 2019-05-28 DIAGNOSIS — R29818 Other symptoms and signs involving the nervous system: Secondary | ICD-10-CM | POA: Insufficient documentation

## 2019-05-28 DIAGNOSIS — R2689 Other abnormalities of gait and mobility: Secondary | ICD-10-CM | POA: Diagnosis present

## 2019-05-28 NOTE — Therapy (Signed)
[facility] [address] [place], [place], [address] Phone: [phone]   Fax:  [phone]  Physical Therapy Evaluation  Patient Details  Name: Chelsea Mercado MRN: [id number] Date of Birth: 30-Jun-1960 Referring Provider (PT): Alonza Bogus, DO   Encounter Date: 05/28/2019  PT End of Session - 05/28/19 2143    Visit Number  1    Number of Visits  5    Date for PT Re-Evaluation  08/26/19    Authorization Type  [employer]-20 VL, hard max    PT Start Time  1100    PT Stop Time  1148    PT Time Calculation (min)  48 min    Activity Tolerance  Patient tolerated treatment well    Behavior During Therapy  [facility] for tasks assessed/performed       Past Medical History:  Diagnosis Date  . Anemia   . Arthritis   . Controlled type 1 diabetes mellitus without complication ([place]) [id number]  . Hyperlipidemia 06/22/2013  . Obesity, Class I, BMI 30-34.9 06/22/2013  . PONV (postoperative nausea and vomiting)   . Post menopausal syndrome 06/22/2013  . Primary hypothyroidism 06/22/2013  . Vitamin D deficiency 09/24/2013    Past Surgical History:  Procedure Laterality Date  . APPENDECTOMY    . BREAST LUMPECTOMY WITH RADIOACTIVE SEED LOCALIZATION Left 06/10/2018   Procedure: LEFT BREAST LUMPECTOMY WITH RADIOACTIVE SEED LOCALIZATION;  Surgeon: Erroll Luna, MD;  Location: [facility];  Service: General;  Laterality: Left;  . CESAREAN SECTION  1985  . TONSILLECTOMY      There were no vitals filed for this visit.   Subjective Assessment - 05/28/19 1108    Subjective  I'm new to the group ([facility]).  I'm here for an initial evaluation for a baseline.  Have a resting tremor on my L hand.  Sometimes worse than others.  The tremor is the only thing I notice.  Walk with friends about 45 minutes per day.  No falls.    Patient Stated Goals  For a baseline evaluation    Currently in Pain?  No/denies          [facility] PT Assessment - 05/28/19 1111      Assessment   Medical Diagnosis  Parkinson's disease    Referring Provider (PT)  Wells Guiles Tat, DO    Onset Date/Surgical Date  04/23/19   PT referring   Hand Dominance  Right      Precautions   Precautions  None      Balance Screen   Has the patient fallen in the past 6 months  No    Has the patient had a decrease in activity level because of a fear of falling?   No    Is the patient reluctant to leave their home because of a fear of falling?   No      Home Social worker  Private residence    Living Arrangements  Spouse/significant other    Available Help at Discharge  Family    Type of [place] to enter    Entrance Stairs-Number of Steps  2    [place]  Two level;Able to live on main level with bedroom/bathroom   Bonus room upstairs     Prior Function   Level of Independence  Independent    Vocation  Other (comment)    Vocation Requirements  Not currently working; has  worked in [employer] profession previously    Leisure  Enjoys walking daily (for about 45 minutes); has workout equipment at home      Observation/Other Assessments   Focus on Therapeutic Outcomes (FOTO)   NA      Posture/Postural Control   Posture/Postural Control  No significant limitations      Tone   Assessment Location  Right Lower Extremity;Left Lower Extremity      ROM / Strength   AROM / PROM / Strength  Strength      Strength   Overall Strength  Within functional limits for tasks performed      Transfers   Transfers  Sit to Stand;Stand to Sit    Sit to Stand  6: Modified independent (Device/Increase time);Without upper extremity assist;From chair/3-in-1    Five time sit to stand comments   9.57    Stand to Sit  6: Modified independent (Device/Increase time);Without upper extremity assist;To chair/3-in-1      Ambulation/Gait   Ambulation/Gait  Yes    Ambulation/Gait Assistance  7: Independent     Ambulation Distance (Feet)  250 Feet    Assistive device  None    Gait Pattern  Step-through pattern;Decreased arm swing - left    Ambulation Surface  Level;Indoor    Gait velocity  8.63 sec = 3.8 ft/sec    Gait velocity - backwards  7.6 sec over 20 ft (2.63 ft/sec)      Standardized Balance Assessment   Standardized Balance Assessment  Timed Up and Go Test;Mini-BESTest      Mini-BESTest   Sit To Stand  Normal: Comes to stand without use of hands and stabilizes independently.    Rise to Toes  Normal: Stable for 3 s with maximum height.    Stand on one leg (left)  Moderate: < 20 s   19.37, 2.12   Stand on one leg (right)  Moderate: < 20 s   20, 9.41   Stand on one leg - lowest score  1    Compensatory Stepping Correction - Forward  Normal: Recovers independently with a single, large step (second realignement is allowed).    Compensatory Stepping Correction - Backward  Normal: Recovers independently with a single, large step    Compensatory Stepping Correction - Left Lateral  Normal: Recovers independently with 1 step (crossover or lateral OK)    Compensatory Stepping Correction - Right Lateral  Normal: Recovers independently with 1 step (crossover or lateral OK)    Stepping Corredtion Lateral - lowest score  2    Stance - Feet together, eyes open, firm surface   Normal: 30s    Stance - Feet together, eyes closed, foam surface   Normal: 30s    Incline - Eyes Closed  Normal: Stands independently 30s and aligns with gravity    Change in Gait Speed  Normal: Significantly changes walkling speed without imbalance    Walk with head turns - Horizontal  Normal: performs head turns with no change in gait speed and good balance    Walk with pivot turns  Normal: Turns with feet close FAST (< 3 steps) with good balance.    Step over obstacles  Moderate: Steps over box but touches box OR displays cautious behavior by slowing gait.    Timed UP & GO with Dual Task  Moderate: Dual Task affects either  counting OR walking (>10%) when compared to the TUG without Dual Task.    Mini-BEST total score  25  Timed Up and Go Test   Normal TUG (seconds)  8.44    Manual TUG (seconds)  8.56    Cognitive TUG (seconds)  10.41    TUG Comments  Scores >10% difference in TUG and TUG cognitive indicate difficulty with dual tasking.      RLE Tone   RLE Tone  Within Functional Limits      LLE Tone   LLE Tone  Mild                  Objective measurements completed on examination: See above findings.              PT Education - 05/28/19 2142    Education Details  Eval results and POC    Person(s) Educated  Patient    Methods  Explanation    Comprehension  Verbalized understanding          PT Long Term Goals - 05/28/19 2149      PT LONG TERM GOAL #1   Title  Pt will be independent with Parkinson's specific exercise program for improved functional mobility, timing and coordination with gait.  TARGET 06/19/2019    Time  4    Period  Weeks    Status  New      PT LONG TERM GOAL #2   Title  Pt will improve TUG/TUG cognitive score to less than 10% difference for improved dual tasking with gait.    Time  4    Period  Weeks    Status  New      PT LONG TERM GOAL #3   Title  Pt will verbalize understanding of local Parkinson's disease related resources, including optimal fitness.    Time  4    Period  Weeks    Status  New             Plan - 05/28/19 2144    Clinical Impression Statement  Pt is a 59 year old female with newly diagnosed Parkinson's disease, who presents to OPPT desiring a baseline for her mobility.  Her primary PD symptom is tremor in her L hand, though throughout PT session, therapist noted slight stiffness in LLE as well as decreased timing and coordination with LUE during gait activities.  She also demonstrates>10% difference with TUG and TUG cognitive scores, which indicates difficulty with dual tasking.  She does not have a Parkisnon's  specific exercise program and would benefit from education in Parkinson's specific exercises to address posture, timing and coordination of upper and lower extremities for overall optimal funcitonal mobility.    Personal Factors and Comorbidities  Comorbidity 3+    Comorbidities  See problem list    Examination-Activity Limitations  Locomotion Level    Examination-Participation Restrictions  Community Activity    Stability/Clinical Decision Making  Stable/Uncomplicated    Clinical Decision Making  Low    Rehab Potential  Good    PT Frequency  1x / week    PT Duration  4 weeks   May anticipate early discharge due to pt's high level of function   PT Treatment/Interventions  ADLs/Self Care Home Management;Gait training;Functional mobility training;Therapeutic activities;Therapeutic exercise;Balance training;Neuromuscular re-education;Patient/family education    PT Next Visit Plan  Initiate PWR! Moves in standing, gait with exaggerated arm swing, dynamic balance activities    Consulted and Agree with Plan of Care  Patient       Patient will benefit from skilled therapeutic intervention in order to improve the following deficits and  impairments:  Abnormal gait, Decreased coordination, Impaired tone, Decreased mobility  Visit Diagnosis: Other abnormalities of gait and mobility  Other symptoms and signs involving the nervous system     Problem List Patient Active Problem List   Diagnosis Date Noted  . Parkinson's disease ([place]) 11/20/2018  . Family history of premature CAD 03/19/2018  . DOE (dyspnea on exertion) 03/19/2018  . Left thyroid nodule 10/01/2016  . Varicose veins of bilateral lower extremities with other complications [id number]  . Vitamin D deficiency 09/24/2013  . Type 1 diabetes mellitus with hyperglycemia ([place]) 06/22/2013  . Mixed diabetic hyperlipidemia associated with type 1 diabetes mellitus ([place]) 06/22/2013  . Obesity, Class I, BMI 30-34.9 06/22/2013  . Primary  hypothyroidism 06/22/2013    Mickaela Starlin W. 05/28/2019, 9:52 PM  Mady Haagensen, PT 05/28/19 9:52 PM Phone: [phone] Fax: [facility] [address] [place], [place], [address] Phone: [phone]   Fax:  [phone]  Name: Chelsea Mercado MRN: [id number] Date of Birth: [birth year]

## 2019-05-29 ENCOUNTER — Ambulatory Visit: Payer: 59 | Admitting: Physical Therapy

## 2019-05-29 ENCOUNTER — Encounter: Payer: Self-pay | Admitting: Physical Therapy

## 2019-05-29 DIAGNOSIS — R2689 Other abnormalities of gait and mobility: Secondary | ICD-10-CM | POA: Diagnosis not present

## 2019-05-29 DIAGNOSIS — R29818 Other symptoms and signs involving the nervous system: Secondary | ICD-10-CM

## 2019-05-29 NOTE — Therapy (Signed)
[facility] [address] [place], [place], [address] Phone: [phone]   Fax:  [phone]  Physical Therapy Treatment  Patient Details  Name: Chelsea Mercado MRN: [id number] Date of Birth: 1960-08-06 Referring Provider (PT): Alonza Bogus, DO   Encounter Date: 05/29/2019  PT End of Session - 05/29/19 1102    Visit Number  2    Number of Visits  5    Date for PT Re-Evaluation  08/26/19    Authorization Type  [employer]-20 VL, hard max    PT Start Time  0848    PT Stop Time  0929    PT Time Calculation (min)  41 min    Activity Tolerance  Patient tolerated treatment well;Patient limited by pain   pain reported L shoulder; limits performance with PWR! Moves   Behavior During Therapy  WFL for tasks assessed/performed       Past Medical History:  Diagnosis Date  . Anemia   . Arthritis   . Controlled type 1 diabetes mellitus without complication ([place]) [id number]  . Hyperlipidemia 06/22/2013  . Obesity, Class I, BMI 30-34.9 06/22/2013  . PONV (postoperative nausea and vomiting)   . Post menopausal syndrome 06/22/2013  . Primary hypothyroidism 06/22/2013  . Vitamin D deficiency 09/24/2013    Past Surgical History:  Procedure Laterality Date  . APPENDECTOMY    . BREAST LUMPECTOMY WITH RADIOACTIVE SEED LOCALIZATION Left 06/10/2018   Procedure: LEFT BREAST LUMPECTOMY WITH RADIOACTIVE SEED LOCALIZATION;  Surgeon: Erroll Luna, MD;  Location: [facility];  Service: General;  Laterality: Left;  . CESAREAN SECTION  1985  . TONSILLECTOMY      There were no vitals filed for this visit.  Subjective Assessment - 05/29/19 0852    Subjective  No new questions.  Nothing new since yesterday.    Patient Stated Goals  For a baseline evaluation    Currently in Pain?  No/denies            Neuro Re-education   Seated PWR! Hands exercise to facilitate open hands, especially on LUE:  flicks x 5 reps  (PWR! Up with hands), cues for elbow extension, cues for wrist extension and opening fingers wide.  Also educated/demo'd weightbearing through LUE for decreased tremor, with extended elbow position.  Pt c/o initial pain in L shoulder with PWR! Up Hands position.  Eases off at rest.  Attempted standing PWR! Moves, beginning with PWR! Up, chair in front for support x 8 reps-pt c/o pain in L shoulder. Tried to modified with squat position with UEs at chair, x 3 reps, pt with continued pain.  Changed positions to supine for open posture position, PWR! Up in supine x 5 reps, no c/o pain.  PWR! Up in sitting:  Forward lean to upright posture, x 5 reps (pt c/o pain in L shoulder with upright posture position).  Pt reports pain in L shoulder with initiation of PWR! Moves (<5/10).  With active shoulder motions, pt reports stiffness through L shoulder with all motions, especially external rotation, internal rotation.  Pt tender to palpation at anterior aspect of L shoulder and tender to palpation.  Pt reports increased pain with attempts at upright posture, scapular retraction with PWR! Up activities.                 Self Care: Discussed L shoulder pain (contributing factors could be potential stiffness/rigidity with changes to movement patterns over time in L shoulder; optimal posture with sitting, reaching UE  activities)  Educated patient to hold off on upper body weights as part of her exercise routine to see if pain lessens.     PT Education - 05/29/19 1101    Education Details  Posture, positioning, especially with overhead motions with L shoulder; avoid weights for the short-term to help to calm down L shoulder pain; contact MD if further shoulder pain.    Person(s) Educated  Patient    Methods  Explanation;Demonstration    Comprehension  Verbalized understanding          PT Long Term Goals - 05/28/19 2149      PT LONG TERM GOAL #1   Title  Pt will be independent with  Parkinson's specific exercise program for improved functional mobility, timing and coordination with gait.  TARGET 06/19/2019    Time  4    Period  Weeks    Status  New      PT LONG TERM GOAL #2   Title  Pt will improve TUG/TUG cognitive score to less than 10% difference for improved dual tasking with gait.    Time  4    Period  Weeks    Status  New      PT LONG TERM GOAL #3   Title  Pt will verbalize understanding of local Parkinson's disease related resources, including optimal fitness.    Time  4    Period  Weeks    Status  New            Plan - 05/29/19 1205    Clinical Impression Statement  Pt returned to first treatment session today, with plan to initiate PWR! Moves exercises in standing.  However, with initiation of exercises, pt begins to report pain in anterior/superior aspect of L shoulder.  PT provides cues and feedback to attempt to perform with optimal posture, as pt does exhibit some rounded posture and slight L shoulder elevation with attempts at [facility]! Up position in standing and sitting.  With active shoulder ROM on LUE, she has limitations at end range and reports stiffness, but no pain.  Pain is present with L shoulder flexion (palms down) and then into transition into scapular retraction.  Question if ridigity/stiffness is part of issue of L shoulder pain (did not fully assess yesterday), especially since pt does upper body exercises at home with weights. Did not provide any exercises today, but suggested pt follow up with physician if continued or worsening pain.    Personal Factors and Comorbidities  Comorbidity 3+    Comorbidities  See problem list    Examination-Activity Limitations  Locomotion Level    Examination-Participation Restrictions  Community Activity    Stability/Clinical Decision Making  Stable/Uncomplicated    Rehab Potential  Good    PT Frequency  1x / week    PT Duration  4 weeks   May anticipate early discharge due to pt's high level of function    PT Treatment/Interventions  ADLs/Self Care Home Management;Gait training;Functional mobility training;Therapeutic activities;Therapeutic exercise;Balance training;Neuromuscular re-education;Patient/family education    PT Next Visit Plan  Ask about L shoulder pain; Initiate PWR! Moves in standing, gait with exaggerated arm swing, dynamic balance activities; further postural education; may need to schedule additional PT appointments    Consulted and Agree with Plan of Care  Patient       Patient will benefit from skilled therapeutic intervention in order to improve the following deficits and impairments:  Abnormal gait, Decreased coordination, Impaired tone, Decreased mobility  Visit Diagnosis:  Other symptoms and signs involving the nervous system     Problem List Patient Active Problem List   Diagnosis Date Noted  . Parkinson's disease ([place]) 11/20/2018  . Family history of premature CAD 03/19/2018  . DOE (dyspnea on exertion) 03/19/2018  . Left thyroid nodule 10/01/2016  . Varicose veins of bilateral lower extremities with other complications [id number]  . Vitamin D deficiency 09/24/2013  . Type 1 diabetes mellitus with hyperglycemia ([place]) 06/22/2013  . Mixed diabetic hyperlipidemia associated with type 1 diabetes mellitus ([place]) 06/22/2013  . Obesity, Class I, BMI 30-34.9 06/22/2013  . Primary hypothyroidism 06/22/2013    Chelsea Latterell W. 05/29/2019, 12:11 PM Frazier Butt., PT  [facility] [address] [place], [place], [address] Phone: [phone]   Fax:  [phone]  Name: Makinly Mangel MRN: [id number] Date of Birth: 09-06-1960

## 2019-06-09 ENCOUNTER — Other Ambulatory Visit: Payer: Self-pay

## 2019-06-09 ENCOUNTER — Ambulatory Visit: Payer: 59 | Attending: Neurology | Admitting: Physical Therapy

## 2019-06-09 DIAGNOSIS — M25512 Pain in left shoulder: Secondary | ICD-10-CM | POA: Insufficient documentation

## 2019-06-09 DIAGNOSIS — M25612 Stiffness of left shoulder, not elsewhere classified: Secondary | ICD-10-CM | POA: Diagnosis present

## 2019-06-09 DIAGNOSIS — R2689 Other abnormalities of gait and mobility: Secondary | ICD-10-CM | POA: Insufficient documentation

## 2019-06-09 DIAGNOSIS — R29818 Other symptoms and signs involving the nervous system: Secondary | ICD-10-CM | POA: Diagnosis present

## 2019-06-09 NOTE — Therapy (Signed)
[facility] [address] [place], [place], [address] Phone: [phone]   Fax:  [phone]  Physical Therapy Treatment  Patient Details  Name: Chelsea Mercado MRN: [id number] Date of Birth: 10-12-1960 Referring Provider (PT): Alonza Bogus, DO   Encounter Date: 06/09/2019  PT End of Session - 06/09/19 0847    Visit Number  3    Number of Visits  5    Date for PT Re-Evaluation  08/26/19    Authorization Type  [employer]-20 VL, hard max    PT Start Time  [phone]    PT Stop Time  0932    PT Time Calculation (min)  46 min    Activity Tolerance  Patient tolerated treatment well;Patient limited by pain   Pt rates pain as 1-2/10 at end of session   Behavior During Therapy  [facility] for tasks assessed/performed       Past Medical History:  Diagnosis Date  . Anemia   . Arthritis   . Controlled type 1 diabetes mellitus without complication ([place]) [id number]  . Hyperlipidemia 06/22/2013  . Obesity, Class I, BMI 30-34.9 06/22/2013  . PONV (postoperative nausea and vomiting)   . Post menopausal syndrome 06/22/2013  . Primary hypothyroidism 06/22/2013  . Vitamin D deficiency 09/24/2013    Past Surgical History:  Procedure Laterality Date  . APPENDECTOMY    . BREAST LUMPECTOMY WITH RADIOACTIVE SEED LOCALIZATION Left 06/10/2018   Procedure: LEFT BREAST LUMPECTOMY WITH RADIOACTIVE SEED LOCALIZATION;  Surgeon: Erroll Luna, MD;  Location: [facility];  Service: General;  Laterality: Left;  . CESAREAN SECTION  1985  . TONSILLECTOMY      There were no vitals filed for this visit.  Subjective Assessment - 06/09/19 0846    Subjective  Had a good weekend.  The last day I was here my shoulder bother me closer to my neck.  I know I'm very tight up there.  I'm beginning to realize that I hold my L arm differently, closer to my chest and body.    Patient Stated Goals  For a baseline evaluation    Currently in Pain?   No/denies                        [facility] Adult PT Treatment/Exercise - 06/09/19 0900      Ambulation/Gait   Ambulation/Gait  Yes    Ambulation/Gait Assistance  7: Independent    Ambulation Distance (Feet)  800 Feet   250 ft, with cues for reciprocal arm swing, stride length   Assistive device  None    Gait Pattern  Step-through pattern;Decreased arm swing - left;Decreased stride length    Ambulation Surface  Level;Indoor    Gait Comments  Used bilateral walking poles, to help with facilitation of reciprocal arm swing.  Cues for increased effort with LUE/arm swing to produce normal amount of movement      Posture/Postural Control   Posture/Postural Control  Postural limitations    Postural Limitations  Rounded Shoulders    Posture Comments  Supine, attempting PWR! Up position through shoulder, with tactile cues for technique, x 5 reps (no pain).  Then neck retraction, x 5 reps (with pt c/o pain in L upper traps area).  Cues for correct technique, but pt continue to c/o pain.  Pt very tender to palpation along L upper traps.  Trigger points noted along L upper traps.  Discussed how pt can self-stretch upper traps (pt already do  this and appears to demonstrate good positioning).        Self-Care   Self-Care  Other Self-Care Comments    Other Self-Care Comments   Educated patient to avoid weights through LUE at this time, due to pt's pain in L upper traps and shoulder area; discussed again how rigidity and stiffness, posture positioning over time may be contributing to this pain; discussed optimal movmeent patterns, ways to stretch and relax through LUE.  Discussed possibility of OT to more formally address LUE tremors, stiffness, rigidity and posture changes to avoid future pain; however, pt concerned regarding husband's changing jobs/insurance and unsure about OT at this time.      Neuro Re-ed    Neuro Re-ed Details   Stagger stance weightshifting forward/back, beginning near  counter with focus on weightshifting through hips, then added reciprocal arm swing, at least 10 reps, each foot position.  Cues for equal, opposite arm swing.  Near corner with widened BOS, trunk rotation and reaching across body, equal/opposite arm swing with trunk rotation, cues for increased effort with arm swing x 10 reps each side.  No c/o pain during these activities.        Cues with gait to focus on increased step length, relaxed motions through LUE.  Additional 60 ft x 2 of gait with cues for relaxed reciprocal arm swing      PT Education - 06/09/19 1048    Education Details  See self-care; provided handout for rock and reach forward/back, trunk rotation rock and reach    Person(s) Educated  Patient    Methods  Explanation;Demonstration;Handout;Verbal cues    Comprehension  Verbalized understanding;Returned demonstration          PT Long Term Goals - 05/28/19 2149      PT LONG TERM GOAL #1   Title  Pt will be independent with Parkinson's specific exercise program for improved functional mobility, timing and coordination with gait.  TARGET 06/19/2019    Time  4    Period  Weeks    Status  New      PT LONG TERM GOAL #2   Title  Pt will improve TUG/TUG cognitive score to less than 10% difference for improved dual tasking with gait.    Time  4    Period  Weeks    Status  New      PT LONG TERM GOAL #3   Title  Pt will verbalize understanding of local Parkinson's disease related resources, including optimal fitness.    Time  4    Period  Weeks    Status  New            Plan - 06/09/19 1050    Clinical Impression Statement  Focused today's session on reciprocal arm swing, and large effort with LUE movements with rocking, reaching, and with gait.  (no c/o pain with these exercises).  Did these activities instead of PWR! Moves, due to pain that pt was experiencing last visit. With attempts at supine PWR! Up positioning and neck retraction for posture, pt experiences  pain in L upper traps.  With palpation, pt very tender along L upper traps; again explained connection with posture, stiffness/rigidity and tremors in affected L UE and posture/position over time creating pain.  Pt does demonstrate improved awareness of LUE positioning today; however, with discussion regarding OT to specifically address, pt would like to wait and discuss with her husband.  Will continue to beneift from focus on posture,coordination of LUE with  gait and lower body movements.    Personal Factors and Comorbidities  Comorbidity 3+    Comorbidities  See problem list    Examination-Activity Limitations  Locomotion Level    Examination-Participation Restrictions  Community Activity    Stability/Clinical Decision Making  Stable/Uncomplicated    Rehab Potential  Good    PT Frequency  1x / week    PT Duration  4 weeks   May anticipate early discharge due to pt's high level of function   PT Treatment/Interventions  ADLs/Self Care Home Management;Gait training;Functional mobility training;Therapeutic activities;Therapeutic exercise;Balance training;Neuromuscular re-education;Patient/family education    PT Next Visit Plan  Ask about L shoulder pain; ask about OT;  Review HEP from 06/09/2019 visit; gait with exaggerated arm swing, dynamic balance activities; further postural education; may need to schedule additional PT appointments    Consulted and Agree with Plan of Care  Patient       Patient will benefit from skilled therapeutic intervention in order to improve the following deficits and impairments:  Abnormal gait, Decreased coordination, Impaired tone, Decreased mobility  Visit Diagnosis: Other abnormalities of gait and mobility  Other symptoms and signs involving the nervous system     Problem List Patient Active Problem List   Diagnosis Date Noted  . Parkinson's disease ([place]) 11/20/2018  . Family history of premature CAD 03/19/2018  . DOE (dyspnea on exertion) 03/19/2018  .  Left thyroid nodule 10/01/2016  . Varicose veins of bilateral lower extremities with other complications [id number]  . Vitamin D deficiency 09/24/2013  . Type 1 diabetes mellitus with hyperglycemia ([place]) 06/22/2013  . Mixed diabetic hyperlipidemia associated with type 1 diabetes mellitus ([place]) 06/22/2013  . Obesity, Class I, BMI 30-34.9 06/22/2013  . Primary hypothyroidism 06/22/2013    Tyreka Henneke W. 06/09/2019, 10:55 AM  Frazier Butt., PT   [facility] [address] [place], [place], [address] Phone: [phone]   Fax:  [phone]  Name: Chelsea Mercado MRN: [id number] Date of Birth: May 29, 1960

## 2019-06-09 NOTE — Patient Instructions (Signed)
Provided handout for stagger stance forward/back rock and reach, x 10 reps, 1-2 x/day  Provided handout for widened BOS trunk rotation rock and reach, x 10 reps, 1-2x/day

## 2019-06-11 LAB — HM DIABETES EYE EXAM

## 2019-06-16 ENCOUNTER — Telehealth: Payer: Self-pay | Admitting: Physical Therapy

## 2019-06-16 ENCOUNTER — Other Ambulatory Visit: Payer: Self-pay

## 2019-06-16 ENCOUNTER — Ambulatory Visit: Payer: 59 | Admitting: Physical Therapy

## 2019-06-16 DIAGNOSIS — R2689 Other abnormalities of gait and mobility: Secondary | ICD-10-CM

## 2019-06-16 DIAGNOSIS — R29818 Other symptoms and signs involving the nervous system: Secondary | ICD-10-CM

## 2019-06-16 DIAGNOSIS — G20 Parkinson's disease: Secondary | ICD-10-CM

## 2019-06-16 NOTE — Telephone Encounter (Signed)
Good morning, I am seeing Baxter Hire for PT, and she is having some significant pain and stiffnesss in her left shoulder.  With our attempts at typical PWR! Moves and posture exercises, she is experiencing difficulty due to pain.  She is having tremors with LUE and she may benefit from occupational therapy to address her left upper extremity stiffness, rigidity, tremors to help avoid future complications.  If you agree, could you please send order for OT via Epic?  Thank you.  Mady Haagensen, PT 06/16/19 8:57 AM Phone: [phone] Fax: [phone]

## 2019-06-17 NOTE — Therapy (Signed)
[facility] [address] [place], [place], [address] Phone: [phone]   Fax:  [phone]  Physical Therapy Treatment/Discharge Summary  Patient Details  Name: Chelsea Mercado MRN: [id number] Date of Birth: 02/12/1960 Referring Provider (PT): Alonza Bogus, DO   Encounter Date: 06/16/2019  PT End of Session - 06/17/19 1435    Visit Number  4    Number of Visits  5    Date for PT Re-Evaluation  08/26/19    Authorization Type  [employer]-20 VL, hard max    PT Start Time  [phone]    PT Stop Time  0928    PT Time Calculation (min)  42 min    Activity Tolerance  Patient tolerated treatment well;Patient limited by pain   Pain and stiffness in L shoulder with activity; pt needs cues to relax and decrease gaurding position of L shoulder   Behavior During Therapy  [facility] for tasks assessed/performed       Past Medical History:  Diagnosis Date   Anemia    Arthritis    Controlled type 1 diabetes mellitus without complication ([place]) 11/01/8525   Hyperlipidemia 06/22/2013   Obesity, Class I, BMI 30-34.9 06/22/2013   PONV (postoperative nausea and vomiting)    Post menopausal syndrome 06/22/2013   Primary hypothyroidism 06/22/2013   Vitamin D deficiency 09/24/2013    Past Surgical History:  Procedure Laterality Date   APPENDECTOMY     BREAST LUMPECTOMY WITH RADIOACTIVE SEED LOCALIZATION Left 06/10/2018   Procedure: LEFT BREAST LUMPECTOMY WITH RADIOACTIVE SEED LOCALIZATION;  Surgeon: Erroll Luna, MD;  Location: [facility];  Service: General;  Laterality: Left;   [place]      There were no vitals filed for this visit.  Subjective Assessment - 06/16/19 0848    Subjective  Doing okay.  Haven't been doing the exercises too much that you gave me. Sometimes my neck will seize up when I turn and look to L side.  I think it all makes sense with my L side.  Would like to  see if we can do OT.    Patient Stated Goals  For a baseline evaluation    Currently in Pain?  No/denies                        [facility] Adult PT Treatment/Exercise - 06/16/19 0850      Ambulation/Gait   Ambulation/Gait  Yes    Ambulation/Gait Assistance  7: Independent    Ambulation Distance (Feet)  600 Feet    Assistive device  None    Gait Pattern  Step-through pattern;Decreased arm swing - left;Decreased stride length    Ambulation Surface  Level;Indoor    Gait Comments  Cues for increased arm swing LUE       Posture/Postural Control   Posture/Postural Control  Postural limitations    Postural Limitations  Rounded Shoulders   L shoulder higher than R   Posture Comments  Educated in exercises to relax through L shoulder:  gentle shoulder rolls, gentle scapular retraction with cues to prevent L shoulder hiking, forward trunk lean to upright sitting, each x at least 5 reps.      Standardized Balance Assessment   Standardized Balance Assessment  Timed Up and Go Test      Timed Up and Go Test   TUG  Normal TUG;Cognitive TUG    Normal TUG (seconds)  8.87  Cognitive TUG (seconds)  10.37      Self-Care   Self-Care  Other Self-Care Comments    Other Self-Care Comments   Educated patient in local Parkinson's disease resources.  Discussed pt's L shoulder pain in regards to PD-related rigidity, stiffness and that pt would likely need to discuss this with her neurologist/movement disorders specialist (pt not currently on PD-related medication).  Discussed that OT may be able to better address the stiffness, rigidity and tremors associated with her Parkinson's disease, from an UE standpoint and that PT will d/c this visit.  Recommended return PD screen in 6 months.      Neuro Re-ed    Neuro Re-ed Details   Reviewed stagger stance forward/back rocking with reciprocal arm swing-cues for increased arm swing LUE, x 10 reps each foot position.  Reviewed wide BOS trunk rotation  reaching across body, with cues for equal, even arm swing.  Performed wide BOS standing relaxed trunk rotation arm swing, x 5 reps each side.             PT Education - 06/17/19 1434    Education Details  Parkinson's resources, OT eval/referral, follow up with MD with further concerns/questions regarding L shoulder/arm pain and stiffness    Person(s) Educated  Patient    Methods  Explanation;Handout    Comprehension  Verbalized understanding          PT Long Term Goals - 06/17/19 1436      PT LONG TERM GOAL #1   Title  Pt will be independent with Parkinson's specific exercise program for improved functional mobility, timing and coordination with gait.  TARGET 06/19/2019    Baseline  independent with HEP provided, unable to provide PWR! Moves, due to shoulder pain    Time  4    Period  Weeks    Status  Achieved      PT LONG TERM GOAL #2   Title  Pt will improve TUG/TUG cognitive score to less than 10% difference for improved dual tasking with gait.    Time  4    Period  Weeks    Status  Not Met      PT LONG TERM GOAL #3   Title  Pt will verbalize understanding of local Parkinson's disease related resources, including optimal fitness.    Time  4    Period  Weeks    Status  Achieved            Plan - 06/17/19 1436    Clinical Impression Statement  Pt has met 2 of 3 LTGs set at initial evaluation.  Pt's further progression of Parkinson's specific HEP has been limited due to pt's c/o L shoulder pain.  PT feels that Parkinson's related stiffness and decreased postural awareness, increased muscle guarding may be contributing to pt's pain.  With pt's balance measures largely WNL, PT feels that pt will be best served by OT specifically addressing LUE deficits as well as follow up with her neurologist.  Pt is appropriate for d/c at this time.    Personal Factors and Comorbidities  Comorbidity 3+    Comorbidities  See problem list    Examination-Activity Limitations   Locomotion Level    Examination-Participation Restrictions  Community Activity    Stability/Clinical Decision Making  Stable/Uncomplicated    Rehab Potential  Good    PT Frequency  1x / week    PT Duration  4 weeks   May anticipate early discharge due to pt's high level  of function   PT Treatment/Interventions  ADLs/Self Care Home Management;Gait training;Functional mobility training;Therapeutic activities;Therapeutic exercise;Balance training;Neuromuscular re-education;Patient/family education    PT Next Visit Plan  D/C from PT; recommend PT screen in 6 months; pt to schedule OT eval upon receipt of referral.    Consulted and Agree with Plan of Care  Patient       Patient will benefit from skilled therapeutic intervention in order to improve the following deficits and impairments:  Abnormal gait, Decreased coordination, Impaired tone, Decreased mobility  Visit Diagnosis: Other symptoms and signs involving the nervous system  Other abnormalities of gait and mobility     Problem List Patient Active Problem List   Diagnosis Date Noted   Parkinson's disease ([place]) 11/20/2018   Family history of premature CAD 03/19/2018   DOE (dyspnea on exertion) 03/19/2018   Left thyroid nodule 10/01/2016   Varicose veins of bilateral lower extremities with other complications 81/15/7262   Vitamin D deficiency 09/24/2013   Type 1 diabetes mellitus with hyperglycemia ([place]) 06/22/2013   Mixed diabetic hyperlipidemia associated with type 1 diabetes mellitus ([place]) 06/22/2013   Obesity, Class I, BMI 30-34.9 06/22/2013   Primary hypothyroidism 06/22/2013    Jacquees Gongora W. 06/17/2019, 2:40 PM  Frazier Butt., PT   [facility] [address] [place], [place], [address] Phone: [phone]   Fax:  [phone]  Name: Chelsea Mercado MRN: [id number] Date of Birth: 09-28-1960   PHYSICAL THERAPY DISCHARGE SUMMARY  Visits from Start  of Care: 4  Current functional level related to goals / functional outcomes: PT Long Term Goals - 06/17/19 1436      PT LONG TERM GOAL #1   Title  Pt will be independent with Parkinson's specific exercise program for improved functional mobility, timing and coordination with gait.  TARGET 06/19/2019    Baseline  independent with HEP provided, unable to provide PWR! Moves, due to shoulder pain    Time  4    Period  Weeks    Status  Achieved      PT LONG TERM GOAL #2   Title  Pt will improve TUG/TUG cognitive score to less than 10% difference for improved dual tasking with gait.    Time  4    Period  Weeks    Status  Not Met      PT LONG TERM GOAL #3   Title  Pt will verbalize understanding of local Parkinson's disease related resources, including optimal fitness.    Time  4    Period  Weeks    Status  Achieved      Pt has met 2 of 3 LTGs.   Remaining deficits: Posture, LUE tremors, LUE rigidity, LUE pain   Education / Equipment: Educated in [employer], community PD resources.  Plan: Patient agrees to discharge.  Patient goals were partially met. Patient is being discharged due to being pleased with the current functional level.  ?????Pt is to be scheduled with OT for further work on LUE deficits.  Recommend PT screen in 6 months due to progressive nature of disease.        Mady Haagensen, PT 06/17/19 2:42 PM Phone: [phone] Fax: [phone]

## 2019-06-18 ENCOUNTER — Other Ambulatory Visit: Payer: Self-pay

## 2019-06-18 ENCOUNTER — Ambulatory Visit: Payer: 59 | Admitting: Occupational Therapy

## 2019-06-18 DIAGNOSIS — M25612 Stiffness of left shoulder, not elsewhere classified: Secondary | ICD-10-CM

## 2019-06-18 DIAGNOSIS — R29818 Other symptoms and signs involving the nervous system: Secondary | ICD-10-CM

## 2019-06-18 DIAGNOSIS — M25512 Pain in left shoulder: Secondary | ICD-10-CM

## 2019-06-18 DIAGNOSIS — R2689 Other abnormalities of gait and mobility: Secondary | ICD-10-CM | POA: Diagnosis not present

## 2019-06-18 NOTE — Therapy (Signed)
[facility] [address] [place], [place], [address] Phone: [phone]   Fax:  [phone]  Occupational Therapy Evaluation  Patient Details  Name: Chelsea Mercado MRN: [id number] Date of Birth: 23-Mar-1960 Referring Provider (OT): Dr. Carles Collet   Encounter Date: 06/18/2019   OT End of Session - 06/18/19 1709    Visit Number 1    Number of Visits 11    Date for OT Re-Evaluation 07/25/19    Authorization Type [employer], benefits may end at end of June as pt's husband has lost his job    Authorization Time Period 2x week x 5 weeks plus eval or 10 visits total    OT Start Time 1318    OT Stop Time 1357    OT Time Calculation (min) 39 min           Past Medical History:  Diagnosis Date  . Anemia   . Arthritis   . Controlled type 1 diabetes mellitus without complication ([place]) 92/42/6834  . Hyperlipidemia 06/22/2013  . Obesity, Class I, BMI 30-34.9 06/22/2013  . PONV (postoperative nausea and vomiting)   . Post menopausal syndrome 06/22/2013  . Primary hypothyroidism 06/22/2013  . Vitamin D deficiency 09/24/2013    Past Surgical History:  Procedure Laterality Date  . APPENDECTOMY    . BREAST LUMPECTOMY WITH RADIOACTIVE SEED LOCALIZATION Left 06/10/2018   Procedure: LEFT BREAST LUMPECTOMY WITH RADIOACTIVE SEED LOCALIZATION;  Surgeon: Erroll Luna, MD;  Location: [facility];  Service: General;  Laterality: Left;  . CESAREAN SECTION  1985  . TONSILLECTOMY      There were no vitals filed for this visit.   Subjective Assessment - 06/18/19 1325    Patient Stated Goals improve ROM in left shoulder    Currently in Pain? No/denies   denies shoulder pain today, however pt has shoulder pain at times with malpositioning and overhead reach            [facility] OT Assessment - 06/18/19 1327      Assessment   Medical Diagnosis Parkinson's disease    Referring Provider (OT) Dr. Carles Collet    Onset Date/Surgical Date 06/16/19     Hand Dominance Right      Precautions   Precautions None      Balance Screen   Has the patient fallen in the past 6 months No    Has the patient had a decrease in activity level because of a fear of falling?  No      Home  Environment   Family/patient expects to be discharged to: Private residence    Living Arrangements Children    Lives With Family      Prior Function   Level of [place] Other (comment)   currently not working   Leisure Enjoys walking daily (for about 45 minutes); has workout equipment at home      ADL   Eating/Feeding Modified independent    Grooming Modified independent    Upper Body Bathing Modified independent    Lower Body Bathing Modified independent    Upper Body Dressing Independent    Lower Body Dressing Modified independent    Toilet Transfer Modified independent;Independent    Tub/Shower Transfer Modified independent      IADL   Shopping Takes care of all shopping needs independently    [place] alone or with occasional assistance    Meal Prep Plans, prepares and serves adequate meals independently  Medication Management Is responsible for taking medication in correct dosages at correct time    Financial Management Manages financial matters independently (budgets, writes checks, pays rent, bills goes to bank), collects and keeps track of income      Mobility   Mobility Status Independent      Written Expression   Dominant Hand Right    Handwriting 100% legible    Written Experience Within Functional Limits      Vision Assessment   Vision Assessment Vision not tested      Cognition   Overall Cognitive Status Within Functional Limits for tasks assessed      Observation/Other Assessments   Physical Performance Test   Yes    Simulated Eating Time (seconds) 10.59    Donning Doffing Jacket Time (seconds) 9.82    Donning Doffing Jacket Comments 21.16 secs      Posture/Postural  Control   Posture/Postural Control Postural limitations    Postural Limitations Rounded Shoulders      Coordination   9 Hole Peg Test Right;Left    Right 9 Hole Peg Test 20 sec    Left 9 Hole Peg Test 24.37 secs    Box and Blocks RUE 65, LUE 63    Tremors resting tremor LUE      Tone   Assessment Location Right Upper Extremity;Left Upper Extremity      ROM / Strength   AROM / PROM / Strength AROM      AROM   Overall AROM  Deficits    Overall AROM Comments RUE shoulder flexion grossly 160, elbow WFLs, LUE shoulder flexion 130 however pt reports tightness and pain at times, shoulder abduction 120, elbow ext -10      LUE Tone   LUE Tone Mild   rigidity                               OT Long Term Goals - 06/18/19 1656      OT LONG TERM GOAL #1   Title I with PD specific HEP    Time 5    Period Weeks    Status New      OT LONG TERM GOAL #2   Title Pt will verbalize understanding of adapted strategies to maximize safety and I with ADLs/ IADLs .    Time 5    Period Weeks    Status New      OT LONG TERM GOAL #3   Title Pt will verbalize understanding of ways to prevent future PD related complications and PD community resources.      OT LONG TERM GOAL #4   Title Pt will demonstrate ability to perform shoulder flexion and abduction at 120  or greater for ADLs and exercises with no consistent pain    Baseline Pt has had inconsistent pain with end range reaching    Time 5    Period Weeks    Status New                 Plan - 06/18/19 1705    Clinical Impression Statement Pt is a 59 year old female with newly diagnosed Parkinson's disease, with hx of LUE adhesive capsulitis(self reported) who presents to OPOT. Pt presents with the following deficits; LUE resting temor, mild rigidiy, decreased ROM, decrease ROM which impedes perromance of exercise and ADLs/IADLs. Pt can benefit from skilled occupational therapy to maximize pt's safety and I with daily  activities.    OT Occupational Profile and History Detailed Assessment- Review of Records and additional review of physical, cognitive, psychosocial history related to current functional performance    Occupational performance deficits (Please refer to evaluation for details): ADL's;IADL's    Body Structure / Function / Physical Skills ADL;UE functional use;Flexibility;Pain;ROM;Coordination;IADL;Tone    Rehab Potential Good    Clinical Decision Making Limited treatment options, no task modification necessary    Comorbidities Affecting Occupational Performance: May have comorbidities impacting occupational performance    Modification or Assistance to Complete Evaluation  No modification of tasks or assist necessary to complete eval    OT Frequency 2x / week    OT Duration --   5 weeks, plus eval, or 11 visits total over 8 weeks   OT Treatment/Interventions Self-care/ADL training;Ultrasound;Patient/family education;DME and/or AE instruction;Paraffin;Passive range of motion;Cryotherapy;Fluidtherapy;Splinting;Functional Mobility Training;Moist Heat;Therapeutic exercise;Manual Therapy;Therapeutic activities;Neuromuscular education    Plan PWR! supine, address shoulder pain, and proper positioning    Consulted and Agree with Plan of Care Patient           Patient will benefit from skilled therapeutic intervention in order to improve the following deficits and impairments:   Body Structure / Function / Physical Skills: ADL, UE functional use, Flexibility, Pain, ROM, Coordination, IADL, Tone       Visit Diagnosis: Other symptoms and signs involving the nervous system - Plan: Ot plan of care cert/re-cert  Stiffness of left shoulder, not elsewhere classified - Plan: Ot plan of care cert/re-cert  Acute pain of left shoulder - Plan: Ot plan of care cert/re-cert  Other abnormalities of gait and mobility - Plan: Ot plan of care cert/re-cert    Problem List Patient Active Problem List    Diagnosis Date Noted  . Parkinson's disease ([place]) 11/20/2018  . Family history of premature CAD 03/19/2018  . DOE (dyspnea on exertion) 03/19/2018  . Left thyroid nodule 10/01/2016  . Varicose veins of bilateral lower extremities with other complications 44/96/7591  . Vitamin D deficiency 09/24/2013  . Type 1 diabetes mellitus with hyperglycemia ([place]) 06/22/2013  . Mixed diabetic hyperlipidemia associated with type 1 diabetes mellitus ([place]) 06/22/2013  . Obesity, Class I, BMI 30-34.9 06/22/2013  . Primary hypothyroidism 06/22/2013    Chelsea Mercado 06/18/2019, 5:17 PM  [facility] [address] [place], [place], [address] Phone: [phone]   Fax:  [phone]  Name: Chelsea Mercado MRN: [id number] Date of Birth: 1960/06/06

## 2019-06-24 ENCOUNTER — Ambulatory Visit: Payer: 59 | Admitting: Occupational Therapy

## 2019-06-24 ENCOUNTER — Encounter: Payer: Self-pay | Admitting: Occupational Therapy

## 2019-06-24 DIAGNOSIS — M25612 Stiffness of left shoulder, not elsewhere classified: Secondary | ICD-10-CM

## 2019-06-24 DIAGNOSIS — M25512 Pain in left shoulder: Secondary | ICD-10-CM

## 2019-06-24 DIAGNOSIS — R29818 Other symptoms and signs involving the nervous system: Secondary | ICD-10-CM

## 2019-06-24 DIAGNOSIS — R2689 Other abnormalities of gait and mobility: Secondary | ICD-10-CM | POA: Diagnosis not present

## 2019-06-24 NOTE — Patient Instructions (Addendum)
              Ways to prevent future Parkinson's related complications:  1.   Exercise regularly.  Perform your therapy exercises and incorporate safe aerobic exercise when possible (swimming, stationary bike, arm bike, seated stepper)  2.   Focus on BIGGER movements during daily activities- really reach overhead, straighten elbows and extend fingers  3.   When dressing or reaching for your seatbelt make sure to use your body to assist by twisting and looking at where you are reaching while you reach--this can help to minimize stress on the shoulder and reduce the risk of a rotator cuff tear  4.   Anytime you reach or move shoulder, make sure you have good upright posture.  5.  When you reach for something overhead, make sure your thumb is facing up.  This is a better position for your shoulder.  6.  Swing your arms when you walk!  People with PD are at increased risk for frozen shoulder and swinging your arms can reduce this risk.  7.  Keep you feet apart when you are standing to allow you to have better balance and reach further (which can help with shoulder rigidity).  Also make sure your feet are apart when you are sitting before you stand up.    PWR! Hand Exercises  Then, start with elbows bent and hands closed:   PWR! Hands: Push hands out BIG. Elbows straight, wrists up, fingers open and spread apart BIG.     PWR! Step: Touch index finger to thumb while keeping other fingers straight. Flick fingers out BIG (thumb out/straighten fingers). Repeat with other fingers. (Step your thumb to each finger).   With arms stretched out in front of you (elbows straight), perform the following:   PWR! Rock:  Move wrists up and down [employer]! Twist: Twist palms up and down BIG    ** Make each movement big and deliberate so that you feel the movement.  Perform at least 10 repetitions 1x/day, but perform PWR! Hands throughout the day when you are having trouble using your  hands (picking up/manipulating small objects, writing, eating, typing, sewing, buttoning, etc.).

## 2019-06-24 NOTE — Therapy (Signed)
[facility] [address] [place], [place], [address] Phone: [phone]   Fax:  [phone]  Occupational Therapy Treatment  Patient Details  Name: Chelsea Mercado MRN: [id number] Date of Birth: 06-24-1960 Referring Provider (OT): Dr. Carles Collet   Encounter Date: 06/24/2019   OT End of Session - 06/24/19 1231    Visit Number 2    Number of Visits 11    Date for OT Re-Evaluation 07/25/19    Authorization Type U[employer], benefits may end at end of June as pt's husband has lost his job    Authorization Time Period 2x week x 5 weeks plus eval or 10 visits total    OT Start Time 1233    OT Stop Time 1340    OT Time Calculation (min) 67 min    Activity Tolerance Patient tolerated treatment well    Behavior During Therapy [facility] for tasks assessed/performed           Past Medical History:  Diagnosis Date  . Anemia   . Arthritis   . Controlled type 1 diabetes mellitus without complication ([place]) 54/00/8676  . Hyperlipidemia 06/22/2013  . Obesity, Class I, BMI 30-34.9 06/22/2013  . PONV (postoperative nausea and vomiting)   . Post menopausal syndrome 06/22/2013  . Primary hypothyroidism 06/22/2013  . Vitamin D deficiency 09/24/2013    Past Surgical History:  Procedure Laterality Date  . APPENDECTOMY    . BREAST LUMPECTOMY WITH RADIOACTIVE SEED LOCALIZATION Left 06/10/2018   Procedure: LEFT BREAST LUMPECTOMY WITH RADIOACTIVE SEED LOCALIZATION;  Surgeon: Erroll Luna, MD;  Location: [facility];  Service: General;  Laterality: Left;  . CESAREAN SECTION  1985  . TONSILLECTOMY      There were no vitals filed for this visit.   Subjective Assessment - 06/24/19 1231    Subjective  Pt reports no pain and that she has been working on ROM.  Pt reports that she may bring her friend who is a PT to observe so that friend can help support/cue    Patient Stated Goals improve ROM in left shoulder    Currently in Pain? No/denies                   OT Education - 06/24/19 1345    Education Details PWR! supine (basic 4); PWR! hands (basic 4); Ways to decr risk of future complications related to PD; Benefits of safe Aerobic Exercise; how to exercise for brain change; Most appropriate weighted exercises (pt reports that she wants to use weights to lose weight)--recommended low range weights, focus on extensor muscles, lower weight, but recommended pt wait on use of weights until HEP is established to see if she feels like she needs to use weights in addition to HEP    Person(s) Educated Patient    Methods Explanation;Demonstration;Verbal cues;Handout    Comprehension Verbalized understanding;Returned demonstration;Verbal cues required               OT Long Term Goals - 06/18/19 1656      OT LONG TERM GOAL #1   Title I with PD specific HEP    Time 5    Period Weeks    Status New      OT LONG TERM GOAL #2   Title Pt will verbalize understanding of adapted strategies to maximize safety and I with ADLs/ IADLs .    Time 5    Period Weeks    Status New      OT LONG  TERM GOAL #3   Title Pt will verbalize understanding of ways to prevent future PD related complications and PD community resources.      OT LONG TERM GOAL #4   Title Pt will rdemonstrate ability to perform shoulder flexion and abduction at 120  or greater for ADLs and exercises with no consistent pain    Baseline Pt has had inconsistent pain with end range reaching    Time 5    Period Weeks    Status New                 Plan - 06/24/19 1231    Clinical Impression Statement Pt verbalized understanding of education provided and responds well to cueing for large amplitude movements.  Pt demo good LUE shoulder ROM today (appears WNL after stretching) and no pain.    OT Occupational Profile and History Detailed Assessment- Review of Records and additional review of physical, cognitive, psychosocial history related to current functional  performance    Occupational performance deficits (Please refer to evaluation for details): ADL's;IADL's    Body Structure / Function / Physical Skills ADL;UE functional use;Flexibility;Pain;ROM;Coordination;IADL;Tone    Rehab Potential Good    Clinical Decision Making Limited treatment options, no task modification necessary    Comorbidities Affecting Occupational Performance: May have comorbidities impacting occupational performance    Modification or Assistance to Complete Evaluation  No modification of tasks or assist necessary to complete eval    OT Frequency 2x / week    OT Duration --   5 weeks, plus eval, or 11 visits total over 8 weeks   OT Treatment/Interventions Self-care/ADL training;Ultrasound;Patient/family education;DME and/or AE instruction;Paraffin;Passive range of motion;Cryotherapy;Fluidtherapy;Splinting;Functional Mobility Training;Moist Heat;Therapeutic exercise;Manual Therapy;Therapeutic activities;Neuromuscular education    Plan review PWR! supine, ?PWR! quadraped and or standing if no pain    Consulted and Agree with Plan of Care Patient           Patient will benefit from skilled therapeutic intervention in order to improve the following deficits and impairments:   Body Structure / Function / Physical Skills: ADL, UE functional use, Flexibility, Pain, ROM, Coordination, IADL, Tone       Visit Diagnosis: Other symptoms and signs involving the nervous system  Stiffness of left shoulder, not elsewhere classified  Acute pain of left shoulder    Problem List Patient Active Problem List   Diagnosis Date Noted  . Parkinson's disease ([place]) 11/20/2018  . Family history of premature CAD 03/19/2018  . DOE (dyspnea on exertion) 03/19/2018  . Left thyroid nodule 10/01/2016  . Varicose veins of bilateral lower extremities with other complications 48/01/6551  . Vitamin D deficiency 09/24/2013  . Type 1 diabetes mellitus with hyperglycemia ([place]) 06/22/2013  . Mixed  diabetic hyperlipidemia associated with type 1 diabetes mellitus ([place]) 06/22/2013  . Obesity, Class I, BMI 30-34.9 06/22/2013  . Primary hypothyroidism 06/22/2013    [facility] 06/24/2019, 2:02 PM  [place] [address] [place], [place], [address] Phone: [phone]   Fax:  [phone]  Name: Chelsea Mercado MRN: [id number] Date of Birth: 01/02/1961   Vianne Bulls, OTR/L [facility] [address] [place], [place]  [address] [phone] phone [phone] 06/24/19 2:02 PM

## 2019-07-01 ENCOUNTER — Ambulatory Visit: Payer: 59 | Admitting: Occupational Therapy

## 2019-07-08 ENCOUNTER — Encounter: Payer: Self-pay | Admitting: Occupational Therapy

## 2019-07-08 ENCOUNTER — Other Ambulatory Visit: Payer: Self-pay

## 2019-07-08 ENCOUNTER — Ambulatory Visit: Payer: 59 | Admitting: Occupational Therapy

## 2019-07-08 DIAGNOSIS — M25512 Pain in left shoulder: Secondary | ICD-10-CM

## 2019-07-08 DIAGNOSIS — R2689 Other abnormalities of gait and mobility: Secondary | ICD-10-CM | POA: Diagnosis not present

## 2019-07-08 DIAGNOSIS — M25612 Stiffness of left shoulder, not elsewhere classified: Secondary | ICD-10-CM

## 2019-07-08 DIAGNOSIS — R29818 Other symptoms and signs involving the nervous system: Secondary | ICD-10-CM

## 2019-07-08 NOTE — Therapy (Addendum)
[facility] [address] [place], [place], [address] Phone: [phone]   Fax:  [phone]  Occupational Therapy Treatment  Patient Details  Name: Chelsea Mercado MRN: [id number] Date of Birth: 1960-06-28 Referring Provider (OT): Dr. Carles Collet   Encounter Date: 07/08/2019   OT End of Session - 07/08/19 1452    Visit Number 3    Number of Visits 11    Date for OT Re-Evaluation 07/25/19    Authorization Type UHC, benefits may end at end of June as pt's husband has lost his job    Authorization Time Period 2x week x 5 weeks plus eval or 10 visits total    OT Start Time 1317    OT Stop Time 1418    OT Time Calculation (min) 61 min    Activity Tolerance Patient tolerated treatment well    Behavior During Therapy [facility] for tasks assessed/performed           Past Medical History:  Diagnosis Date  . Anemia   . Arthritis   . Controlled type 1 diabetes mellitus without complication ([place]) 35/46/5681  . Hyperlipidemia 06/22/2013  . Obesity, Class I, BMI 30-34.9 06/22/2013  . PONV (postoperative nausea and vomiting)   . Post menopausal syndrome 06/22/2013  . Primary hypothyroidism 06/22/2013  . Vitamin D deficiency 09/24/2013    Past Surgical History:  Procedure Laterality Date  . APPENDECTOMY    . BREAST LUMPECTOMY WITH RADIOACTIVE SEED LOCALIZATION Left 06/10/2018   Procedure: LEFT BREAST LUMPECTOMY WITH RADIOACTIVE SEED LOCALIZATION;  Surgeon: Erroll Luna, MD;  Location: [facility];  Service: General;  Laterality: Left;  . CESAREAN SECTION  1985  . TONSILLECTOMY      There were no vitals filed for this visit.   Subjective Assessment - 07/08/19 1320    Subjective  Pt reports no pain recently.  Pt also requests d/c today as pt no longer has insurance after today.    Patient Stated Goals improve ROM in left shoulder    Currently in Pain? No/denies            Checked goals and discussed  progress.        OT Education - 07/08/19 1459    Education Details PWR! quadruped and standing (basic 4 in each position) and how they relate to functional movements (such as sit>stand, turning strategy, floor transfer)--pt returned demo each with min cueing; Reviewed strategies for ADLs and ways to decr risk of future complications; follow-up recommendations; proper positioning to decr risk of pain with reaching and using elliptical; discussed general info regarding optimal medication/exercise for treatment of PD per research symptoms of PD and recommended pt discuss further questions with MD.    Terence Lux) Educated Patient    Methods Explanation;Demonstration;Verbal cues;Handout    Comprehension Verbalized understanding;Returned demonstration               OT Long Term Goals - 07/08/19 1323      OT LONG TERM GOAL #1   Title I with PD specific HEP    Time 5    Period Weeks    Status Achieved      OT LONG TERM GOAL #2   Title Pt will verbalize understanding of adapted strategies to maximize safety and I with ADLs/ IADLs .    Time 5    Period Weeks    Status Achieved      OT LONG TERM GOAL #3   Title Pt will verbalize understanding of ways  to prevent future PD related complications and PD community resources.    Status Achieved      OT LONG TERM GOAL #4   Title Pt will rdemonstrate ability to perform shoulder flexion and abduction at 120  or greater for ADLs and exercises with no consistent pain    Baseline Pt has had inconsistent pain with end range reaching    Time 5    Period Weeks    Status Achieved   L-155* with full elbow ext and no pain                Plan - 07/08/19 1452    Clinical Impression Statement Pt now reports no pain in L shoulder and demo improved ROM.  Pt demo understanding of ways to decr risk of future complications.  Pt requests d/c today due to losing insurance benefits after today.    OT Occupational Profile and History Detailed  Assessment- Review of Records and additional review of physical, cognitive, psychosocial history related to current functional performance    Occupational performance deficits (Please refer to evaluation for details): ADL's;IADL's    Body Structure / Function / Physical Skills ADL;UE functional use;Flexibility;Pain;ROM;Coordination;IADL;Tone    Rehab Potential Good    Clinical Decision Making Limited treatment options, no task modification necessary    Comorbidities Affecting Occupational Performance: May have comorbidities impacting occupational performance    Modification or Assistance to Complete Evaluation  No modification of tasks or assist necessary to complete eval    OT Frequency 2x / week    OT Duration --   5 weeks, plus eval, or 11 visits total over 8 weeks   OT Treatment/Interventions Self-care/ADL training;Ultrasound;Patient/family education;DME and/or AE instruction;Paraffin;Passive range of motion;Cryotherapy;Fluidtherapy;Splinting;Functional Mobility Training;Moist Heat;Therapeutic exercise;Manual Therapy;Therapeutic activities;Neuromuscular education    Plan d/c OT; recommend OT re-evaluation in approx 6 months    Consulted and Agree with Plan of Care Patient           Patient will benefit from skilled therapeutic intervention in order to improve the following deficits and impairments:   Body Structure / Function / Physical Skills: ADL, UE functional use, Flexibility, Pain, ROM, Coordination, IADL, Tone       Visit Diagnosis: Other symptoms and signs involving the nervous system  Stiffness of left shoulder, not elsewhere classified  Acute pain of left shoulder    Problem List Patient Active Problem List   Diagnosis Date Noted  . Parkinson's disease ([place]) 11/20/2018  . Family history of premature CAD 03/19/2018  . DOE (dyspnea on exertion) 03/19/2018  . Left thyroid nodule 10/01/2016  . Varicose veins of bilateral lower extremities with other complications  95/28/4132  . Vitamin D deficiency 09/24/2013  . Type 1 diabetes mellitus with hyperglycemia ([place]) 06/22/2013  . Mixed diabetic hyperlipidemia associated with type 1 diabetes mellitus ([place]) 06/22/2013  . Obesity, Class I, BMI 30-34.9 06/22/2013  . Primary hypothyroidism 06/22/2013    OCCUPATIONAL THERAPY DISCHARGE SUMMARY  Visits from Start of Care: 3  Current functional level related to goals / functional outcomes: See above   Remaining deficits: Bradykinesia, rigidity, decr coordination, abnormal posture, Tremors (Now no pain, improved rigidity and ROM)   Education / Equipment: Pt was instructed in the following:  PD-specific HEP, adaptive strategies for ADLs/IADLs, ways to prevent future complications.  Pt verbalized understanding of all education provided.   Plan: Patient agrees to discharge.  Patient goals were  met. Patient is being discharged due to pt request with loss of insurance after today and  being pleased with current functional level.  Pt would benefit from occupational therapy evaluation  in approx 6 months to assess for need for further therapy/functional changes due to progressive nature of diagnosis and to update HEP prn.    [facility] 07/08/2019, 3:29 PM  [facility] [address] [place], [place], [address] Phone: [phone]   Fax:  [phone]  Name: Chelsea Mercado MRN: [id number] Date of Birth: Jan 25, 1960   Vianne Bulls, OTR/L [facility] [address] [place], [place]  [address] [phone] phone [phone] 07/08/19 3:29 PM

## 2019-07-21 ENCOUNTER — Encounter: Payer: 59 | Admitting: Occupational Therapy

## 2019-07-23 ENCOUNTER — Encounter: Payer: 59 | Admitting: Occupational Therapy

## 2019-07-25 IMAGING — MG STEREOTACTIC CORE NEEDLE BIOPSY
7 of 17 series · 7 of 25 positions shown · non-contrast
Comparison: Previous exams.
COMPARISON: Previous exams.

Addendum:
CLINICAL DATA: Screening detected loosely grouped calcifications
spanning approximately 1 cm in the retroareolar LEFT breast.

EXAM:
LEFT BREAST STEREOTACTIC CORE NEEDLE BIOPSY

[L (1 of 7)]
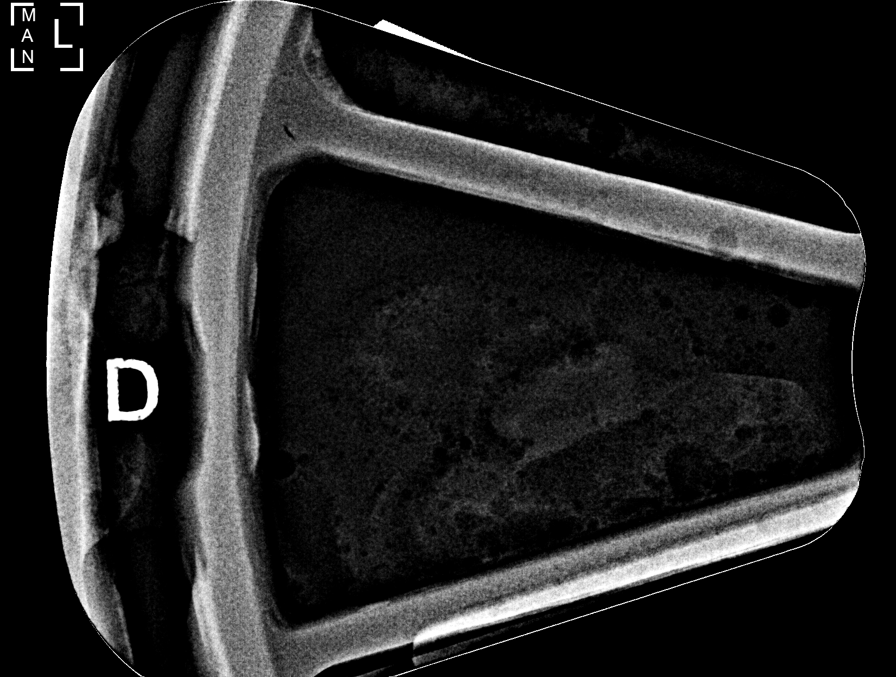

[L (2 of 7)]
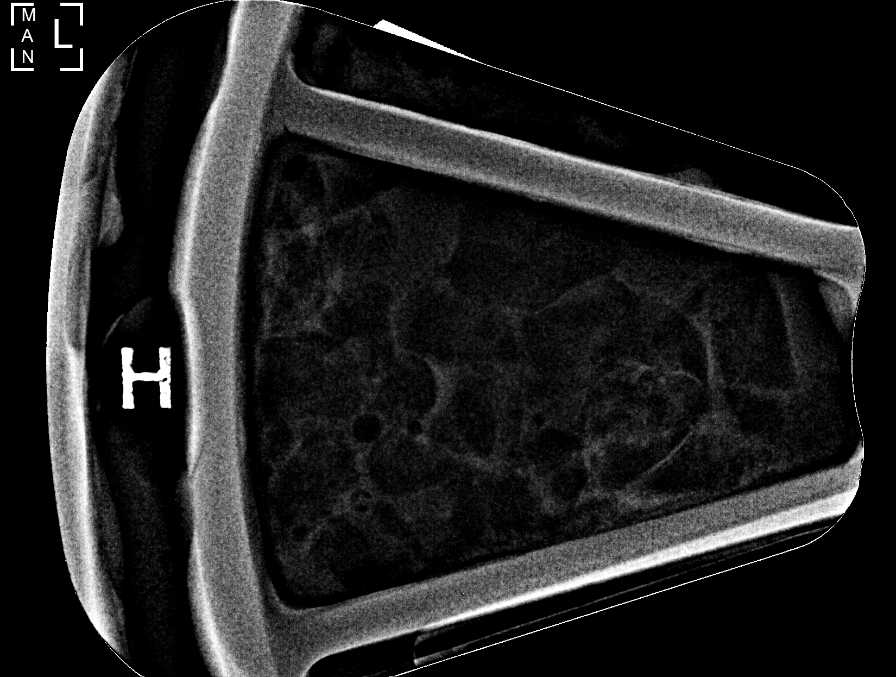

[L (3 of 7)]
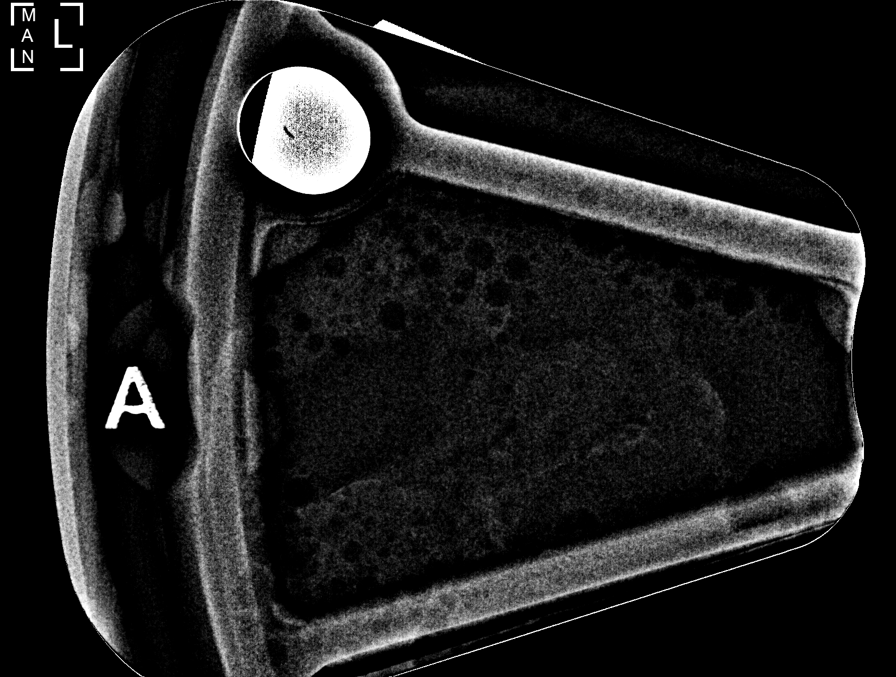

[L (4 of 7)]
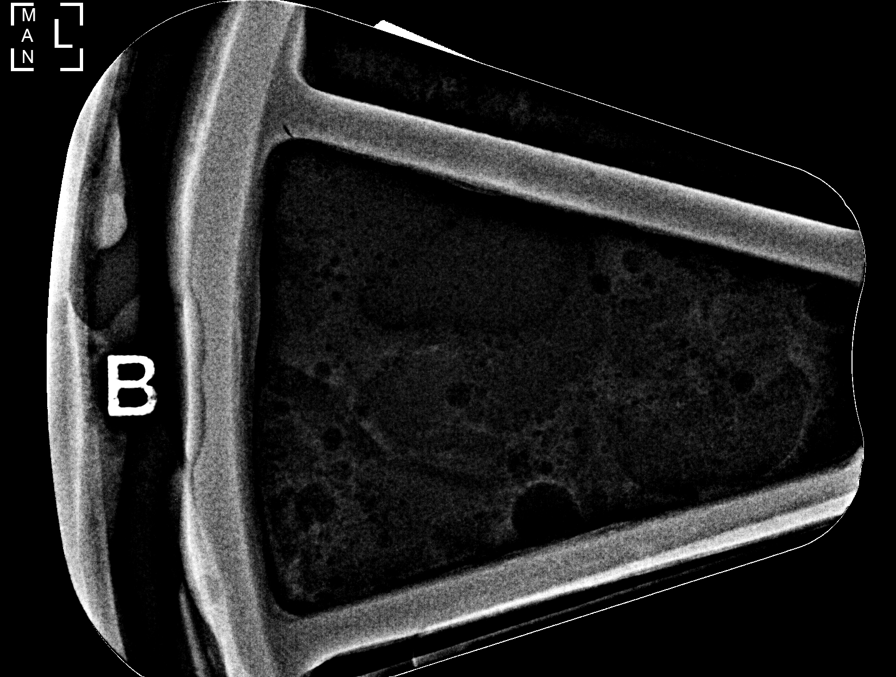

[L (5 of 7)]
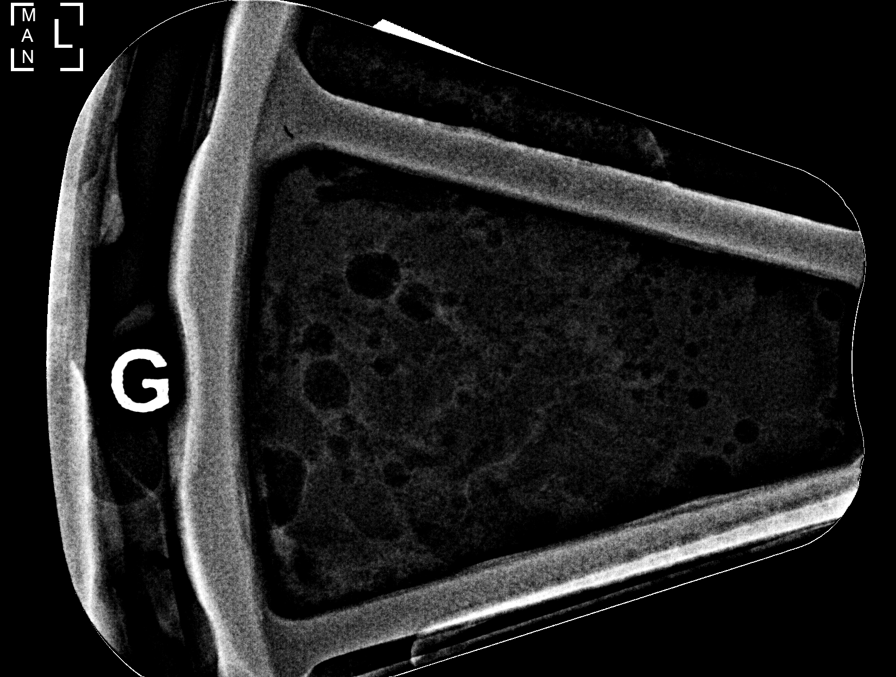

[L (6 of 7)]
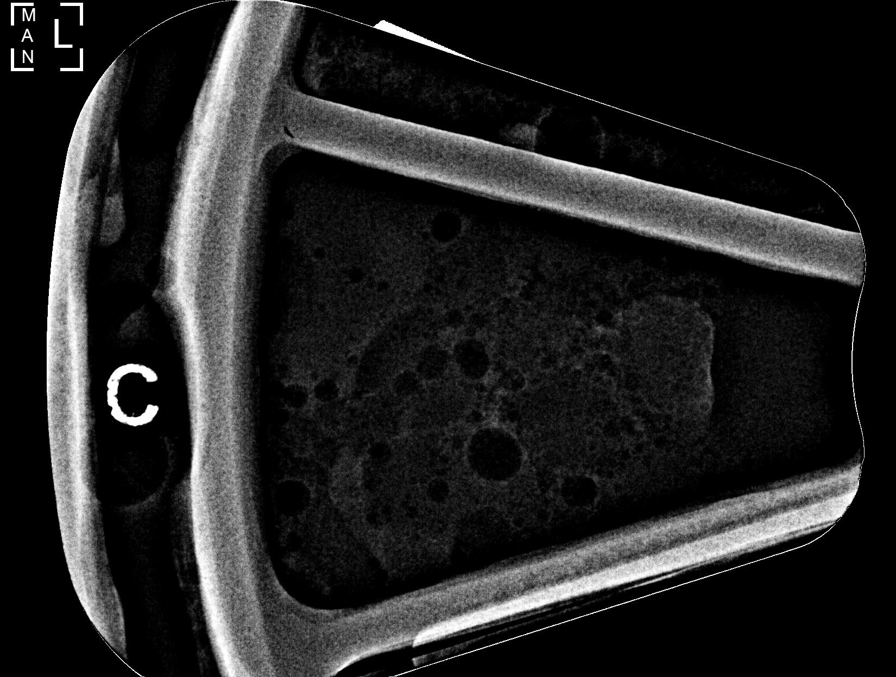

[L (7 of 7)]
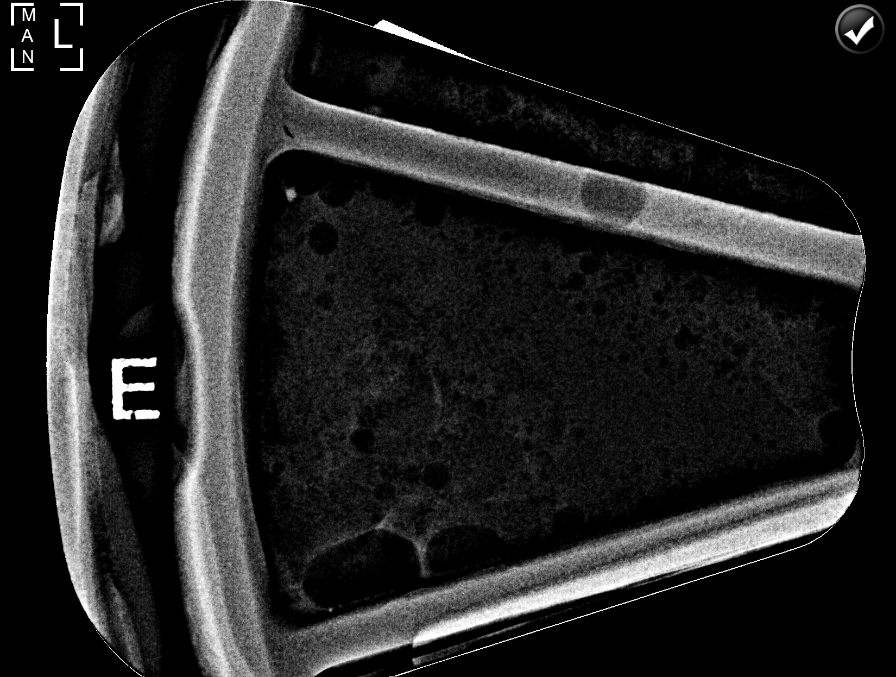

[7 of 25 positions shown; findings below may reference images not displayed]



Using sterile technique with chlorhexidine as skin antisepsis, 1%
lidocaine and 1% lidocaine with epinephrine as local anesthetic,
under stereotactic tomosynthesis guidance, a 9 gauge Brevera vacuum
assisted device was used to perform core needle biopsy of
calcifications in the retroareolar LEFT breast using a LATERAL
approach. Specimen radiograph was performed showing calcifications
in at least 1 of the core samples. Specimens with calcifications are
identified for pathology.

Lesion quadrant: Slight LOWER OUTER QUADRANT.

At the conclusion of the procedure, a coil shaped tissue marker clip
was deployed into the biopsy cavity. Follow-up 2-view mammogram was
performed and dictated separately.
IMPRESSION: Stereotactic-guided biopsy of indeterminate loosely grouped
calcifications in the retroareolar LEFT breast. No apparent
complications.

ADDENDUM:
Pathology revealed PAPILLARY LESION, USUAL DUCTAL HYPERPLASIA,
FIBROCYSTIC CHANGES, AND FIBROADENOMATOID NODULE, CALCIFICATIONS of
the LEFT breast, retroareolar location. This was found to be
concordant by Dr. Zydruole Orintiene, with excision recommended.

Pathology results were discussed with the patient by telephone. The
patient reported doing well after the biopsy with tenderness at the
site. Post biopsy instructions and care were reviewed and questions
were answered. The patient was encouraged to call The [REDACTED]

Surgical consultation has been arranged with Dr. Jermel Del Vecchio at
[REDACTED] on 04/25/2018. There is a second group of
calcifications immediately deep to the nipple, seen well on
magnification views dated March 27, 2018. Surgical excision of these
calcifications may be considered at the time of the excision of the
biopsy proven papillary lesion. Please note that the coil shaped
tissue marker clip placed at the time of biopsy migrated
approximately 2 cm LATERAL to the calcifications, though there are
residual calcifications that can be targeted.

The patient was instructed to return for left diagnostic mammography
in 6 months for the subareolar calcifications (if not excised) and
she was informed a reminder notice would be sent regarding this
appointment.

Pathology results reported by Bae Yon, RN on 03/31/2018.



Using sterile technique with chlorhexidine as skin antisepsis, 1%
lidocaine and 1% lidocaine with epinephrine as local anesthetic,
under stereotactic tomosynthesis guidance, a 9 gauge Brevera vacuum
assisted device was used to perform core needle biopsy of
calcifications in the retroareolar LEFT breast using a LATERAL
approach. Specimen radiograph was performed showing calcifications
in at least 1 of the core samples. Specimens with calcifications are
identified for pathology.

Lesion quadrant: Slight LOWER OUTER QUADRANT.

At the conclusion of the procedure, a coil shaped tissue marker clip
was deployed into the biopsy cavity. Follow-up 2-view mammogram was
performed and dictated separately.
IMPRESSION: Stereotactic-guided biopsy of indeterminate loosely grouped
calcifications in the retroareolar LEFT breast. No apparent
complications.

## 2019-07-28 ENCOUNTER — Encounter: Payer: 59 | Admitting: Occupational Therapy

## 2019-07-30 ENCOUNTER — Encounter: Payer: 59 | Admitting: Occupational Therapy

## 2019-08-04 ENCOUNTER — Encounter: Payer: 59 | Admitting: Occupational Therapy

## 2019-08-07 ENCOUNTER — Encounter: Payer: 59 | Admitting: Occupational Therapy

## 2019-08-11 ENCOUNTER — Encounter: Payer: 59 | Admitting: Occupational Therapy

## 2019-08-11 ENCOUNTER — Other Ambulatory Visit: Payer: Self-pay | Admitting: Internal Medicine

## 2019-08-13 ENCOUNTER — Encounter: Payer: 59 | Admitting: Occupational Therapy

## 2019-09-10 ENCOUNTER — Ambulatory Visit: Payer: 59 | Admitting: Internal Medicine

## 2019-09-10 ENCOUNTER — Other Ambulatory Visit: Payer: Self-pay

## 2019-09-10 ENCOUNTER — Encounter: Payer: Self-pay | Admitting: Internal Medicine

## 2019-09-10 VITALS — BP 130/70 | HR 78 | Ht 64.0 in | Wt 189.0 lb

## 2019-09-10 DIAGNOSIS — E1069 Type 1 diabetes mellitus with other specified complication: Secondary | ICD-10-CM | POA: Diagnosis not present

## 2019-09-10 DIAGNOSIS — E039 Hypothyroidism, unspecified: Secondary | ICD-10-CM | POA: Diagnosis not present

## 2019-09-10 DIAGNOSIS — E782 Mixed hyperlipidemia: Secondary | ICD-10-CM | POA: Diagnosis not present

## 2019-09-10 DIAGNOSIS — E041 Nontoxic single thyroid nodule: Secondary | ICD-10-CM | POA: Diagnosis not present

## 2019-09-10 DIAGNOSIS — E1065 Type 1 diabetes mellitus with hyperglycemia: Secondary | ICD-10-CM | POA: Diagnosis not present

## 2019-09-10 LAB — MICROALBUMIN / CREATININE URINE RATIO
Creatinine,U: 58 mg/dL
Microalb Creat Ratio: 1.2 mg/g (ref 0.0–30.0)
Microalb, Ur: <0.7 mg/dL (ref 0.0–1.9)

## 2019-09-10 LAB — POCT GLYCOSYLATED HEMOGLOBIN (HGB A1C): Hemoglobin A1C: 7.7 % — AB (ref 4.0–5.6)

## 2019-09-10 LAB — T4, FREE: Free T4: 1.65 ng/dL — ABNORMAL HIGH (ref 0.60–1.60)

## 2019-09-10 LAB — LIPID PANEL
Cholesterol: 157 mg/dL (ref 0–200)
HDL: 55.7 mg/dL (ref >39.00)
LDL Cholesterol: 83 mg/dL (ref 0–99)
NonHDL: 100.97
Total CHOL/HDL Ratio: 3
Triglycerides: 90 mg/dL (ref 0.0–149.0)
VLDL: 18 mg/dL (ref 0.0–40.0)

## 2019-09-10 LAB — TSH: TSH: 0.8 u[IU]/mL (ref 0.35–4.50)

## 2019-09-10 NOTE — Patient Instructions (Addendum)
Please continue: - basal rates: 12 AM: 0.775 units/h 2 AM: 0.800 5 AM: 0.950 9 AM: 0.775  - ICR: 1:10 - target: 100-120 - ISF: 50 - Active insulin time: 3 hours  Please enter all carbs into the pump, including snacks and desserts.  Please do the following approximately 15 minutes before every meal: - Enter carbs (C) - Enter sugars (S) - Start insulin bolus (I)  If you exercise, decrease the basal rate to 50% for the duration of exercise and 1 hour afterwards.  Please continue levothyroxine 125 mcg daily.  Take the thyroid hormone every day, with water, at least 30 minutes before breakfast, separated by at least 4 hours from: - acid reflux medications - calcium - iron - multivitamins  Please return in 3-4 months.

## 2019-09-10 NOTE — Progress Notes (Signed)
Patient ID: Jaxyn Mestas, female   DOB: [birth year], 59 y.o.   MRN: [id number]   This visit occurred during the SARS-CoV-2 public health emergency.  Safety protocols were in place, including screening questions prior to the visit, additional usage of staff PPE, and extensive cleaning of exam room while observing appropriate contact time as indicated for disinfecting solutions.   HPI: Elany Felix is a 59 y.o.-year-old female, returning for f/u for DM1, dx'ed at 60 y/o, uncontrolled, without long term complications and hypothyroidism. She saw endocrinology before: Dr. Cherie Dark.  Last visit with me 4 months ago.  She started to improve her diet, eatling less bread, eliminated coffee since last OV, with the majority of changes within the last month.  Sugars improved.  Reviewed HbA1c levels: Lab Results  Component Value Date   HGBA1C 8.0 (A) 04/30/2019   HGBA1C 7.8 (A) 12/29/2018   HGBA1C 7.5 (A) 09/01/2018  02/14/2016: HbA1c 7.6%  Insulin pump: -Previously Medtronic 630 G- on her arm -started 2016-2017, newer pump started 01/2017 -Changed to Medtronic 770G on 04/30/2019.  CGM: -She is not wearing the CGM since she does not have enough places on her abdomen that she can use.  Insulin: -Humalog   Pump settings: - basal rates: 12 AM: 0.775 units/h >> 0.8 5 AM: 0.950 9 AM: 0.775  - ICR: 1:10 - target: 100-120 - ISF: 50 - Active insulin time: 3 hours  - bolus wizard: on TDD from basal insulin: 51% >> 52% (20 units) >> 60% (19 units) >> 60% >> 60% TDD from bolus insulin: 41% >> 38% (12 units) >> 40% (13 units) >> 40% >> 40% Total daily dose: 31.7-50 >> up to 50 units - changes infusion site: q 3-4 days  - Meter: Bayer Contour >> AccuChek  Pt checks her sugars 5x a day - ave 150 +/- 59 >> 169 +/-57 >> 149 +/-55 >> 140 +/- 43: - am: 71, 79-154, 166, 189, 215 - 2h after b'fast: n/c - lunch: 78-157, 177 - 2h after lunch: >> 176 - dinner: 82-167, 208 - 2h after dinner:171, 177 - bedtime:  111, 185    Prev.:   Lowest sugar was  67 >> 62 >> 74 >> 58 >> 70; she has hypoglycemia awareness in the 60s.  She had 1 previous ED visit for hypoglycemia many years ago. I sent a prescription for inhaled glucagon to her pharmacy at last visit. Highest sugar was 556 (infusion site pbs) ...>> 293 >> 300 >> 350 (?). No previous DKA admissions.  Pt's meals are: - Breakfast: egg, bread, milk; milk + cereals; coffee - Lunch: Sandwich, soup, salad, fruit, chips; milk - Dinner: Meat, potatoes, brown rice, veggies or salad - Snacks: 1 She was walking 2-3 times a day (after meals) up to 1 hour a day, but stopped during the coronavirus pandemic.  At last visit she was telling me that she was planning to restart his.  She just started exercise.  -No CKD, last BUN/creatinine:  Lab Results  Component Value Date   BUN 17 11/05/2018   BUN 12 06/06/2018   CREATININE 0.95 11/05/2018   CREATININE 0.89 06/06/2018   On ramipril 10. -+ HL. Last set of lipids: Lab Results  Component Value Date   CHOL 205 (H) 09/01/2018   HDL 58.50 09/01/2018   LDLCALC 121 (H) 09/01/2018   TRIG 127.0 09/01/2018   CHOLHDL 3 09/01/2018   On atorvastatin 40. - last eye exam was in 05/2018: No DR- Dr. Prudencio Burly -No numbness and  tingling in her feet.  Pt has no FH of DM.  Hypothyroidism:  Her TFTs were reviewed: Lab Results  Component Value Date   TSH 2.99 09/01/2018  02/10/2016: TSH 6.671 08/06/2015: TSH 4.35 03/26/2015: TSH 4.37 09/28/2014: TSH 2.73  Pt is on levothyroxine 125 mcg daily, taken: - in am - fasting - at least 30 min from b'fast - no Ca, Fe, MVI, PPIs - not on Biotin  She has a history of vitamin D deficiency: Lab Results  Component Value Date   VD25OH 57.81 09/01/2018   VD25OH 47.47 10/01/2016  Prev: 02/10/2016: vit D 31 08/06/2015: vit D 21  She continues on vitamin D 5000 units daily.  She saw Dr. Brantley Stage for removal of a premalignant breast lesion.  She had breast lumpectomy  with radioactive seed localization.  She sees Dr. Carles Collet for Parkinson's ds.  Her mother had Merkel Cell CA - stage 4. She passed away 02-01-18.  ROS: Constitutional: no weight gain/no weight loss, no fatigue, no subjective hyperthermia, no subjective hypothermia Eyes: no blurry vision, no xerophthalmia ENT: no sore throat, no nodules palpated in neck, no dysphagia, no odynophagia, no hoarseness Cardiovascular: no CP/no SOB/no palpitations/no leg swelling Respiratory: no cough/no SOB/no wheezing Gastrointestinal: no N/no V/no D/no C/no acid reflux Musculoskeletal: no muscle aches/no joint aches Skin: no rashes, no hair loss Neurological: + tremors/no numbness/no tingling/no dizziness  I reviewed pt's medications, allergies, PMH, social hx, family hx, and changes were documented in the history of present illness. Otherwise, unchanged from my initial visit note.  Past Medical History:  Diagnosis Date  . Anemia   . Arthritis   . Controlled type 1 diabetes mellitus without complication ([place]) 59/93/5701  . Hyperlipidemia 06/22/2013  . Obesity, Class I, BMI 30-34.9 06/22/2013  . PONV (postoperative nausea and vomiting)   . Post menopausal syndrome 06/22/2013  . Primary hypothyroidism 06/22/2013  . Vitamin D deficiency 09/24/2013   Past Surgical History:  Procedure Laterality Date  . APPENDECTOMY    . BREAST LUMPECTOMY WITH RADIOACTIVE SEED LOCALIZATION Left 06/10/2018   Procedure: LEFT BREAST LUMPECTOMY WITH RADIOACTIVE SEED LOCALIZATION;  Surgeon: Erroll Luna, MD;  Location: [facility];  Service: General;  Laterality: Left;  . CESAREAN SECTION  1985  . TONSILLECTOMY     Social History   Social History  . Marital status: Married    Spouse name: N/A  . Number of children: 3   Occupational History  . Homemaker    Social History Main Topics  . Smoking status: Never Smoker  . Smokeless tobacco: Never Used  . Alcohol use No  . Drug use: No   Current Outpatient  Medications on File Prior to Visit  Medication Sig Dispense Refill  . azelastine (ASTELIN) 0.1 % nasal spray Place 2 sprays into both nostrils 2 (two) times daily. Use in each nostril as directed 30 mL 12  . Cholecalciferol (VITAMIN D3) 5000 units CAPS Take 1 capsule by mouth daily.     Marland Kitchen glucagon (GLUCAGON EMERGENCY) 1 MG injection Inject 1 mg into the muscle once as needed for up to 1 dose. 1 each 12  . Glucagon 3 MG/DOSE POWD Place 3 mg into the nose once as needed for up to 1 dose. 1 each 11  . glucose blood (ACCU-CHEK GUIDE) test strip Use as instructed 3-4x a day 300 each 5  . ibuprofen (ADVIL) 800 MG tablet Take 1 tablet (800 mg total) by mouth every 8 (eight) hours as needed. 30 tablet 0  .  insulin lispro (HUMALOG) 100 UNIT/ML injection USE AS DIRECTED PER PUMP TAKE MAXIMUM 50 UNITS DAILY 60 mL 1  . Insulin Pen Needle (PEN NEEDLES) 32G X 4 MM MISC 1 each by Does not apply route daily. 100 each 11  . Insulin Syringe-Needle U-100 (INSULIN SYRINGE .3CC/31GX5/16") 31G X 5/16" 0.3 ML MISC Use to inject insulin if needed for back up of pump. 100 each 0  . levothyroxine (SYNTHROID) 125 MCG tablet TAKE 1 TABLET BY MOUTH EVERY DAY 90 tablet 3  . naproxen (NAPROSYN) 500 MG tablet Take 1 tablet (500 mg total) by mouth 2 (two) times daily with a meal. 60 tablet 1  . pravastatin (PRAVACHOL) 40 MG tablet TAKE 1 TABLET BY MOUTH EVERY DAY 90 tablet 3  . ramipril (ALTACE) 10 MG capsule TAKE 1 CAPSULE BY MOUTH EVERY DAY 90 capsule 3  . vitamin B-12 (CYANOCOBALAMIN) 500 MCG tablet Take 1,000 mcg by mouth daily.     No current facility-administered medications on file prior to visit.   Allergies  Allergen Reactions  . Hydrocodone-Acetaminophen Nausea And Vomiting  . Propoxyphene Nausea Only  . Tape Rash   Family History  Problem Relation Age of Onset  . Hypertension Mother   . High Cholesterol Mother   . Coronary artery disease Mother 60       CABG  . Tuberculosis Father   . Heart attack Maternal  Grandfather        Died from MI  . Coronary artery disease Maternal Uncle 75       CABG  . Lung cancer Maternal Uncle        asbestosis  . CAD Maternal Uncle 35       died - coronary aneurysm  . Hyperlipidemia Sister   . Hypertension Sister   . Hypothyroidism Sister   . Tuberculosis Paternal Grandmother   . Cancer Paternal Grandfather   . Hypothyroidism Sister    PE: BP 130/70   Pulse 78   Ht 5\' 4"  (1.626 m)   Wt 189 lb (85.7 kg)   SpO2 96%   BMI 32.44 kg/m  Wt Readings from Last 3 Encounters:  09/10/19 189 lb (85.7 kg)  04/30/19 189 lb (85.7 kg)  03/12/19 185 lb (83.9 kg)   Constitutional: overweight, in NAD Eyes: PERRLA, EOMI, no exophthalmos ENT: moist mucous membranes, no thyromegaly, no cervical lymphadenopathy Cardiovascular: RRR, No MRG Respiratory: CTA B Gastrointestinal: abdomen soft, NT, ND, BS+ Musculoskeletal: no deformities, strength intact in all 4 Skin: moist, warm, no rashes Neurological: + tremor with outstretched hands, DTR normal in all 4  ASSESSMENT: 1. DM1, uncontrolled, without long term complications, but with hyperglycemia  2. Hypothyroidism  3. Thyroid nodule  4.  Hyperlipidemia  PLAN:  1. Patient with longstanding, uncontrolled, type 1 diabetes, on an insulin pump.  She wears her Medtronic pump on her arm due to previous problems with infusion sets. -At last visit, she just changed her insulin pump from Medtronic 632 at 1770 G.  Sugars appeared variable in the morning, mostly above target and they were better later in the day.  She still had some high blood sugars after certain meals, especially when she was introducing carbs into the pump and started bolusing at the time of the meal, not before.  Also, she occasionally was introducing less carbs into the pump so sugars were higher after these meals.  We discussed about improving the time when she was bolusing and advised her to do all boluses approximately 15 minutes before  the meal.  Also,  I strongly advised him to introduce all the carbs into the pump.  Since sugars are higher in the morning we increased her basal rates from 2 AM to 5 AM slightly.  We also discussed about entering 10 to 20 g of carbs into the pump and bolus for coffee.  I also sent a prescription for glucagon to her pharmacy at that time. -At this visit, we reviewed together her pump downloads.  For the last 2 weeks when her sugars are significantly improved, with an average of 140.  She is checking her sugars 3.6 times a day and appears to be getting 60% of insulin from the basal rates and 40% from boluses.  She continues to improve her diet and she feels that the greatest improvement has been the last month.  Indeed, today's  HbA1c: 7.7% (better) -but it should have been feeling better based on the last 2 weeks (lower than 7%).  I strongly encouraged her to continue with her diet and we also discussed about the importance of bolusing 15 minutes before the meals.  She was not very active recently due to the heat and the fact that she developed hypoglycemia when walking outside in the heat.  I advised her that she may need lower basal rates if she start exercising for the duration of exercise and 1 hour afterwards.  She will try to do this.  Otherwise, she needs to enter the entire amount of carbs she is eating with her meals.  She is trying to limit carbs and this may be the reason why she is getting more insulin from the basal rates done from the boluses.  However, due to the improvement in the last 2 weeks, I would not recommend changing her pump settings for now. -I advised her to:  Patient Instructions  Please continue: - basal rates: 12 AM: 0.775 units/h 2 AM: 0.800 5 AM: 0.950 9 AM: 0.775  - ICR: 1:10 - target: 100-120 - ISF: 50 - Active insulin time: 3 hours  Please enter all carbs into the pump, including snacks and desserts.  Please do the following approximately 15 minutes before every meal: - Enter carbs  (C) - Enter sugars (S) - Start insulin bolus (I)  If you exercise, decrease the basal rate to 50% for the duration of exercise and 1 hour afterwards.  Please continue levothyroxine 125 mcg daily.  Take the thyroid hormone every day, with water, at least 30 minutes before breakfast, separated by at least 4 hours from: - acid reflux medications - calcium - iron - multivitamins  Please return in 3-4 months.    - advised to check sugars at different times of the day - 4x a day, rotating check times - advised for yearly eye exams >> she is not UTD - will check annual labs now - return to clinic in 3-4 months      2.  Hypothyroidism  - latest thyroid labs reviewed with pt >> normal: Lab Results  Component Value Date   TSH 2.99 09/01/2018   - she continues on LT4 125 mcg daily - pt feels good on this dose. - we discussed about taking the thyroid hormone every day, with water, >30 minutes before breakfast, separated by >4 hours from acid reflux medications, calcium, iron, multivitamins. Pt. is taking it correctly. - will check thyroid tests today: TSH and fT4 - If labs are abnormal, she will need to return for repeat TFTs in 1.5 months  3. H/o Thyroid nodule -No neck compression symptoms -Resolved on the latest thyroid ultrasound -No further investigation needed for now  4. HL -Reviewed latest lipid panel from 08/2018: LDL above goal, the rest of the fractions at goal: Lab Results  Component Value Date   CHOL 205 (H) 09/01/2018   HDL 58.50 09/01/2018   LDLCALC 121 (H) 09/01/2018   TRIG 127.0 09/01/2018   CHOLHDL 3 09/01/2018  -Continues the statin without side effects  Component     Latest Ref Rng & Units 09/10/2019  Glucose     65 - 99 mg/dL 129 (H)  BUN     7 - 25 mg/dL 19  Creatinine     0.50 - 1.05 mg/dL 0.89  GFR, Est Non African American     > OR = 60 mL/min/1.26m2 71  GFR, Est African American     > OR = 60 mL/min/1.1m2 82  BUN/Creatinine Ratio     6 - 22  (calc) NOT APPLICABLE  Sodium     500 - 146 mmol/L 138  Potassium     3.5 - 5.3 mmol/L 4.9  Chloride     98 - 110 mmol/L 102  CO2     20 - 32 mmol/L 27  Calcium     8.6 - 10.4 mg/dL 9.6  Total Protein     6.1 - 8.1 g/dL 6.9  Albumin MSPROF     3.6 - 5.1 g/dL 4.1  Globulin     1.9 - 3.7 g/dL (calc) 2.8  AG Ratio     1.0 - 2.5 (calc) 1.5  Total Bilirubin     0.2 - 1.2 mg/dL 0.5  Alkaline phosphatase (APISO)     37 - 153 U/L 70  AST     10 - 35 U/L 18  ALT     6 - 29 U/L 13  Cholesterol     0 - 200 mg/dL 157  Triglycerides     0 - 149 mg/dL 90.0  HDL Cholesterol     >39.00 mg/dL 55.70  VLDL     0.0 - 40.0 mg/dL 18.0  LDL (calc)     0 - 99 mg/dL 83  Total CHOL/HDL Ratio      3  NonHDL      100.97  Microalb, Ur     0.0 - 1.9 mg/dL <0.7  Creatinine,U     mg/dL 58.0  MICROALB/CREAT RATIO     0.0 - 30.0 mg/g 1.2  TSH     0.35 - 4.50 uIU/mL 0.80  T4,Free(Direct)     0.60 - 1.60 ng/dL 1.65 (H)  Vitamin D, 25-Hydroxy     30.0 - 100.0 ng/mL 63.7   LDL much improved.  [name], MD PhD [facility] Endocrinology

## 2019-09-11 LAB — COMPLETE METABOLIC PANEL WITH GFR
AG Ratio: 1.5 (calc) (ref 1.0–2.5)
ALT: 13 U/L (ref 6–29)
AST: 18 U/L (ref 10–35)
Albumin: 4.1 g/dL (ref 3.6–5.1)
Alkaline phosphatase (APISO): 70 U/L (ref 37–153)
BUN: 19 mg/dL (ref 7–25)
CO2: 27 mmol/L (ref 20–32)
Calcium: 9.6 mg/dL (ref 8.6–10.4)
Chloride: 102 mmol/L (ref 98–110)
Creat: 0.89 mg/dL (ref 0.50–1.05)
GFR, Est African American: 82 mL/min/{1.73_m2} (ref >=60)
GFR, Est Non African American: 71 mL/min/{1.73_m2} (ref >=60)
Globulin: 2.8 g/dL (calc) (ref 1.9–3.7)
Glucose, Bld: 129 mg/dL — ABNORMAL HIGH (ref 65–99)
Potassium: 4.9 mmol/L (ref 3.5–5.3)
Sodium: 138 mmol/L (ref 135–146)
Total Bilirubin: 0.5 mg/dL (ref 0.2–1.2)
Total Protein: 6.9 g/dL (ref 6.1–8.1)

## 2019-09-11 LAB — VITAMIN D 25 HYDROXY (VIT D DEFICIENCY, FRACTURES): Vit D, 25-Hydroxy: 63.7 ng/mL (ref 30.0–100.0)

## 2019-09-15 NOTE — Progress Notes (Signed)
Assessment/Plan:   1.  Parkinsons Disease   -Patient did seek second opinion at [facility].  The diagnosis was agreed upon.  -Discussed risks and benefits of starting medication.  Ultimately, she decided to hold off on that.  -Talked to her about the importance of increasing safe, cardiovascular exercise.  Gave her information about community exercise programs and where she could go for those programs.  2.  We will plan on seeing her back in the next 6 months, sooner should new neurologic issues arise.   Subjective:   Chelsea Mercado was seen today in follow up for Parkinsons disease.  My previous records were reviewed prior to todays visit as well as outside records available to me. "I feel great really."  Pt denies falls.  Pt denies lightheadedness, near syncope.  No hallucinations.  Mood has been good.  Patient has been to physical and occupational therapy since our last visit.  Those records are reviewed.  Admits not exercising like she should.  She saw endocrinology on September 2.  Current prescribed movement disorder medications: None   PREVIOUS MEDICATIONS: none to date  ALLERGIES:   Allergies  Allergen Reactions  . Hydrocodone-Acetaminophen Nausea And Vomiting  . Propoxyphene Nausea Only  . Tape Rash    CURRENT MEDICATIONS:  Outpatient Encounter Medications as of 09/17/2019  Medication Sig  . azelastine (ASTELIN) 0.1 % nasal spray Place 2 sprays into both nostrils 2 (two) times daily. Use in each nostril as directed  . Cholecalciferol (VITAMIN D3) 5000 units CAPS Take 1 capsule by mouth daily.   . Glucagon 3 MG/DOSE POWD Place 3 mg into the nose once as needed for up to 1 dose.  Marland Kitchen glucose blood (ACCU-CHEK GUIDE) test strip Use as instructed 3-4x a day  . ibuprofen (ADVIL) 800 MG tablet Take 1 tablet (800 mg total) by mouth every 8 (eight) hours as needed.  . insulin lispro (HUMALOG) 100 UNIT/ML injection USE AS DIRECTED PER PUMP TAKE MAXIMUM 50 UNITS DAILY  . Insulin Pen  Needle (PEN NEEDLES) 32G X 4 MM MISC 1 each by Does not apply route daily.  . Insulin Syringe-Needle U-100 (INSULIN SYRINGE .3CC/31GX5/16") 31G X 5/16" 0.3 ML MISC Use to inject insulin if needed for back up of pump.  Marland Kitchen levothyroxine (SYNTHROID) 125 MCG tablet TAKE 1 TABLET BY MOUTH EVERY DAY  . naproxen (NAPROSYN) 500 MG tablet Take 1 tablet (500 mg total) by mouth 2 (two) times daily with a meal.  . pravastatin (PRAVACHOL) 40 MG tablet TAKE 1 TABLET BY MOUTH EVERY DAY  . ramipril (ALTACE) 10 MG capsule TAKE 1 CAPSULE BY MOUTH EVERY DAY  . vitamin B-12 (CYANOCOBALAMIN) 500 MCG tablet Take 1,000 mcg by mouth daily.  . [DISCONTINUED] glucagon (GLUCAGON EMERGENCY) 1 MG injection Inject 1 mg into the muscle once as needed for up to 1 dose. (Patient not taking: Reported on 09/17/2019)   No facility-administered encounter medications on file as of 09/17/2019.    Objective:   PHYSICAL EXAMINATION:    VITALS:   Vitals:   09/17/19 1106  BP: (!) 147/84  Pulse: 85  SpO2: 98%  Weight: 186 lb (84.4 kg)  Height: 5\' 4"  (1.626 m)    GEN:  The patient appears stated age and is in NAD. HEENT:  Normocephalic, atraumatic.  The mucous membranes are moist. The superficial temporal arteries are without ropiness or tenderness. CV:  RRR Lungs:  CTAB Neck/HEME:  There are no carotid bruits bilaterally.  Neurological examination:  Orientation: The  patient is alert and oriented x3. Cranial nerves: There is good facial symmetry with min facial hypomimia. The speech is fluent and clear. Soft palate rises symmetrically and there is no tongue deviation. Hearing is intact to conversational tone. Sensation: Sensation is intact to light touch throughout Motor: Strength is at least antigravity x4.  Movement examination: Tone: There is mild increased tone in the LUE Abnormal movements: there is LUE rest tremor Coordination:  There is  decremation with RAM's, only with toe taps on the L Gait and Station: The  patient has no difficulty arising out of a deep-seated chair without the use of the hands. The patient's stride length is good.    I have reviewed and interpreted the following labs independently    Chemistry      Component Value Date/Time   NA 138 09/10/2019 1123   K 4.9 09/10/2019 1123   CL 102 09/10/2019 1123   CO2 27 09/10/2019 1123   BUN 19 09/10/2019 1123   CREATININE 0.89 09/10/2019 1123      Component Value Date/Time   CALCIUM 9.6 09/10/2019 1123   ALKPHOS 64 11/05/2018 1131   AST 18 09/10/2019 1123   ALT 13 09/10/2019 1123   BILITOT 0.5 09/10/2019 1123       Lab Results  Component Value Date   WBC 8.2 11/05/2018   HGB 13.7 11/05/2018   HCT 40.6 11/05/2018   MCV 90.3 11/05/2018   PLT 280.0 11/05/2018    Lab Results  Component Value Date   TSH 0.80 09/10/2019     Total time spent on today's visit was 30 minutes, including both face-to-face time and nonface-to-face time.  Time included that spent on review of records (prior notes available to me/labs/imaging if pertinent), discussing treatment and goals, answering patient's questions and coordinating care.  Cc:  Orma Flaming, MD

## 2019-09-17 ENCOUNTER — Other Ambulatory Visit: Payer: Self-pay

## 2019-09-17 ENCOUNTER — Ambulatory Visit: Payer: 59 | Admitting: Neurology

## 2019-09-17 ENCOUNTER — Encounter: Payer: Self-pay | Admitting: Neurology

## 2019-09-17 VITALS — BP 147/84 | HR 85 | Ht 64.0 in | Wt 186.0 lb

## 2019-09-17 DIAGNOSIS — G20 Parkinson's disease: Secondary | ICD-10-CM | POA: Diagnosis not present

## 2019-09-23 ENCOUNTER — Ambulatory Visit: Payer: 59 | Admitting: Family Medicine

## 2019-09-23 ENCOUNTER — Encounter: Payer: Self-pay | Admitting: Family Medicine

## 2019-09-23 ENCOUNTER — Other Ambulatory Visit: Payer: Self-pay

## 2019-09-23 VITALS — BP 126/60 | HR 94 | Temp 98.1°F | Ht 64.0 in | Wt 187.0 lb

## 2019-09-23 DIAGNOSIS — H04322 Acute dacryocystitis of left lacrimal passage: Secondary | ICD-10-CM | POA: Diagnosis not present

## 2019-09-23 MED ORDER — DOXYCYCLINE HYCLATE 100 MG PO CAPS: 100.0000 mg | ORAL_CAPSULE | Freq: Two times a day (BID) | ORAL | 0 refills | Status: DC

## 2019-09-23 NOTE — Progress Notes (Signed)
Established Patient Office Visit  Subjective:  Patient ID: Chelsea Mercado, female    DOB: 1960-08-15  Age: 59 y.o. MRN: [id number]  CC:  Chief Complaint  Patient presents with  . Eye Problem    HPI [name] presents for acute swelling left lacrimal duct region with mild swelling of the left upper lid.  She woke up this morning swelling.  Denies any recent injury.  No recent sinusitis symptoms.  No fevers or chills.  No visual symptoms.  Mild tearing.  Denies past history of dacryocystitis.  Feels well otherwise.  Swelling has gone down some since she got up this morning.  She has a little bit of soreness of the left lacrimal system  Past Medical History:  Diagnosis Date  . Anemia   . Arthritis   . Controlled type 1 diabetes mellitus without complication ([place]) 84/69/6295  . Hyperlipidemia 06/22/2013  . Obesity, Class I, BMI 30-34.9 06/22/2013  . PONV (postoperative nausea and vomiting)   . Post menopausal syndrome 06/22/2013  . Primary hypothyroidism 06/22/2013  . Vitamin D deficiency 09/24/2013    Past Surgical History:  Procedure Laterality Date  . APPENDECTOMY    . BREAST LUMPECTOMY WITH RADIOACTIVE SEED LOCALIZATION Left 06/10/2018   Procedure: LEFT BREAST LUMPECTOMY WITH RADIOACTIVE SEED LOCALIZATION;  Surgeon: Erroll Luna, MD;  Location: [facility];  Service: General;  Laterality: Left;  . CESAREAN SECTION  1985  . TONSILLECTOMY      Family History  Problem Relation Age of Onset  . Hypertension Mother   . High Cholesterol Mother   . Coronary artery disease Mother 49       CABG  . Tuberculosis Father   . Heart attack Maternal Grandfather        Died from MI  . Coronary artery disease Maternal Uncle 75       CABG  . Lung cancer Maternal Uncle        asbestosis  . CAD Maternal Uncle 8       died - coronary aneurysm  . Hyperlipidemia Sister   . Hypertension Sister   . Hypothyroidism Sister   . Tuberculosis Paternal Grandmother   . Cancer  Paternal Grandfather   . Hypothyroidism Sister     Social History   Socioeconomic History  . Marital status: Married    Spouse name: Not on file  . Number of children: 3  . Years of education: 14  . Highest education level: High school graduate  Occupational History  . Not on file  Tobacco Use  . Smoking status: Never Smoker  . Smokeless tobacco: Never Used  Vaping Use  . Vaping Use: Never used  Substance and Sexual Activity  . Alcohol use: No  . Drug use: No  . Sexual activity: Yes    Partners: Male  Other Topics Concern  . Not on file  Social History Narrative   Married mother of 3, grandmother 81.  Lives with her husband son and grandson.   Occasionally does yard work, but really never gets into any routine exercise.  Hoping to get into with exercise regimen at the gym.   Right handed   Social Determinants of Health   Financial Resource Strain:   . Difficulty of Paying Living Expenses: Not on file  Food Insecurity:   . Worried About Charity fundraiser in the Last Year: Not on file  . Ran Out of Food in the Last Year: Not on file  Transportation Needs:   .  Lack of Transportation (Medical): Not on file  . Lack of Transportation (Non-Medical): Not on file  Physical Activity:   . Days of Exercise per Week: Not on file  . Minutes of Exercise per Session: Not on file  Stress:   . Feeling of Stress : Not on file  Social Connections:   . Frequency of Communication with Friends and Family: Not on file  . Frequency of Social Gatherings with Friends and Family: Not on file  . Attends Religious Services: Not on file  . Active Member of Clubs or Organizations: Not on file  . Attends Archivist Meetings: Not on file  . Marital Status: Not on file  Intimate Partner Violence:   . Fear of Current or Ex-Partner: Not on file  . Emotionally Abused: Not on file  . Physically Abused: Not on file  . Sexually Abused: Not on file    Outpatient Medications Prior to  Visit  Medication Sig Dispense Refill  . azelastine (ASTELIN) 0.1 % nasal spray Place 2 sprays into both nostrils 2 (two) times daily. Use in each nostril as directed 30 mL 12  . Cholecalciferol (VITAMIN D3) 5000 units CAPS Take 1 capsule by mouth daily.     . Glucagon 3 MG/DOSE POWD Place 3 mg into the nose once as needed for up to 1 dose. 1 each 11  . glucose blood (ACCU-CHEK GUIDE) test strip Use as instructed 3-4x a day 300 each 5  . ibuprofen (ADVIL) 800 MG tablet Take 1 tablet (800 mg total) by mouth every 8 (eight) hours as needed. 30 tablet 0  . insulin lispro (HUMALOG) 100 UNIT/ML injection USE AS DIRECTED PER PUMP TAKE MAXIMUM 50 UNITS DAILY 60 mL 1  . Insulin Pen Needle (PEN NEEDLES) 32G X 4 MM MISC 1 each by Does not apply route daily. 100 each 11  . Insulin Syringe-Needle U-100 (INSULIN SYRINGE .3CC/31GX5/16") 31G X 5/16" 0.3 ML MISC Use to inject insulin if needed for back up of pump. 100 each 0  . levothyroxine (SYNTHROID) 125 MCG tablet TAKE 1 TABLET BY MOUTH EVERY DAY 90 tablet 3  . naproxen (NAPROSYN) 500 MG tablet Take 1 tablet (500 mg total) by mouth 2 (two) times daily with a meal. 60 tablet 1  . pravastatin (PRAVACHOL) 40 MG tablet TAKE 1 TABLET BY MOUTH EVERY DAY 90 tablet 3  . ramipril (ALTACE) 10 MG capsule TAKE 1 CAPSULE BY MOUTH EVERY DAY 90 capsule 3  . vitamin B-12 (CYANOCOBALAMIN) 500 MCG tablet Take 1,000 mcg by mouth daily.     No facility-administered medications prior to visit.    Allergies  Allergen Reactions  . Hydrocodone-Acetaminophen Nausea And Vomiting  . Propoxyphene Nausea Only  . Tape Rash    ROS Review of Systems  Constitutional: Negative for chills and fever.  HENT: Negative for sinus pressure and sinus pain.   Eyes: Negative for visual disturbance.  Respiratory: Negative for cough.   Neurological: Negative for headaches.      Objective:    Physical Exam Vitals reviewed.  Constitutional:      Appearance: Normal appearance.  Eyes:       Conjunctiva/sclera: Conjunctivae normal.     Pupils: Pupils are equal, round, and reactive to light.     Comments: She has some very mild swelling of the left upper lid but no preseptal cellulitis changes.  She does have some very mild erythema and swelling of the left lacrimal duct system down below the eye  Cardiovascular:  Rate and Rhythm: Normal rate and regular rhythm.  Musculoskeletal:     Cervical back: Neck supple.  Lymphadenopathy:     Cervical: No cervical adenopathy.  Neurological:     Mental Status: She is alert.     BP 126/60   Pulse 94   Temp 98.1 F (36.7 C) (Oral)   Ht 5\' 4"  (1.626 m)   Wt 187 lb (84.8 kg)   SpO2 97%   BMI 32.10 kg/m  Wt Readings from Last 3 Encounters:  09/23/19 187 lb (84.8 kg)  09/17/19 186 lb (84.4 kg)  09/10/19 189 lb (85.7 kg)     Health Maintenance Due  Topic Date Due  . COVID-19 Vaccine (1) Never done  . FOOT EXAM  01/07/2018  . INFLUENZA VACCINE  08/09/2019    There are no preventive care reminders to display for this patient.  Lab Results  Component Value Date   TSH 0.80 09/10/2019   Lab Results  Component Value Date   WBC 8.2 11/05/2018   HGB 13.7 11/05/2018   HCT 40.6 11/05/2018   MCV 90.3 11/05/2018   PLT 280.0 11/05/2018   Lab Results  Component Value Date   NA 138 09/10/2019   K 4.9 09/10/2019   CO2 27 09/10/2019   GLUCOSE 129 (H) 09/10/2019   BUN 19 09/10/2019   CREATININE 0.89 09/10/2019   BILITOT 0.5 09/10/2019   ALKPHOS 64 11/05/2018   AST 18 09/10/2019   ALT 13 09/10/2019   PROT 6.9 09/10/2019   ALBUMIN 4.1 11/05/2018   CALCIUM 9.6 09/10/2019   ANIONGAP 8 06/06/2018   GFR 60.36 11/05/2018   Lab Results  Component Value Date   CHOL 157 09/10/2019   Lab Results  Component Value Date   HDL 55.70 09/10/2019   Lab Results  Component Value Date   LDLCALC 83 09/10/2019   Lab Results  Component Value Date   TRIG 90.0 09/10/2019   Lab Results  Component Value Date   CHOLHDL 3  09/10/2019   Lab Results  Component Value Date   HGBA1C 7.7 (A) 09/10/2019      Assessment & Plan:   Problem List Items Addressed This Visit    None    Visit Diagnoses    Dacryocystitis, acute, left    -  Primary    No evidence for preseptal or peri-orbital cellulitis at this time. -We recommended starting doxycycline 100 mg twice daily and warm compresses. -Recommend prompt follow-up with primary if symptoms not resolving with the above and especially if she has any progressive redness, swelling, fever  Meds ordered this encounter  Medications  . doxycycline (VIBRAMYCIN) 100 MG capsule    Sig: Take 1 capsule (100 mg total) by mouth 2 (two) times daily.    Dispense:  20 capsule    Refill:  0    Follow-up: No follow-ups on file.    Carolann Littler, MD

## 2019-09-23 NOTE — Patient Instructions (Signed)
Dacryocystitis Dacryocystitis is an infection of the sac that collects tears (lacrimal sac). The lacrimal sac is located between the inner corner of the eye and the nose. The glands of the eyelids make tears that keep the surface of the eye wet and protected. Tears drain from two small tubes (ducts) in the eyelids. These ducts carry tears to the lacrimal sac. Another tube (nasolacrimal duct) carries tears from the lacrimal sac down into the back of the nose to the throat. Dacryocystitis can be sudden (acute) or long-lasting (chronic). It usually affects only one eye. What are the causes? The most common cause of this condition is a blocked nasolacrimal duct. When this duct is blocked, tears cannot drain into the nose, and tears become backed up in the lacrimal sac. Bacteria that normally live in the eye, on the skin, or in the nose start to grow inside the sac and cause infection. The nasolacrimal duct may become blocked because of:  A nose or sinus infection that spreads into the duct.  A duct that is abnormally shaped (malformed).  A growth or swelling in the nose.  An injury or surgery that narrows or scars the duct. Dacryocystitis also may start as an eye infection that spreads to the lacrimal sac. Sometimes, the cause of dacryocystitis is not known. What increases the risk? You are more likely to develop this condition if you:  Are older than 59 years of age.  Are female. Women tend to have a narrower nasolacrimal duct than men.  Have had nasal trauma, such as a broken nose or nasal surgery.  Have nasal polyps. What are the signs or symptoms? Symptoms of acute dacryocystitis start suddenly and may include:  Excessive tearing.  A matted, watery eye.  Swelling and redness over the lacrimal sac.  Discharge of mucus or pus into the eye. This may cause blurred vision.  Eye pain.  A fever. Symptoms of chronic dacryocystitis usually include:  More tearing than  usual.  Discharge of mucus or pus into the eye.  Blurred vision. Redness, pain, and swelling are less common with chronic dacryocystitis. How is this diagnosed? This condition is diagnosed based on your medical history and a physical exam. During the exam, your health care provider may press between your eye and the side of your nose to see if discharge flows back into your eye. You may also have tests, such as:  Removal of a sample of discharge from your eye or nose to check for infection.  A test where your health care provider will put a yellow dye in your eye to see if the dye disappears from your eye (dye disappearance test). A swab may be placed in your nose to see if the dye drains to your nose.  A test where a thin, lighted scope (endoscope) is placed in your nose to determine what is causing the duct blockage (nasal endoscopy). How is this treated? Acute dacryocystitis is treated with antibiotic medicines. These are usually given by mouth (orally), but they can also be given as eye drops or ointments. If the infection has spread to tissues around the eye (orbital cellulitis), antibiotics may be given through an IV. Chronic dacryocystitis usually needs to be treated with surgery. Surgical options include:  Probing the duct to open it.  Widening the duct.  Removing a nasal blockage. Follow these instructions at home: Medicines  Take over-the-counter and prescription medicines only as told by your health care provider.  Take or apply your antibiotic medicine,  drops, or ointment as told by your health care provider. Do not stop taking or applying the antibiotic even if you start to feel better. General instructions  If directed by your health care provider, apply a clean, warm compress to the inside corner of your eye. To do this: ? Wash your hands first. ? Hold the compress over the inside corner of your eye for a few minutes. ? Repeat this every few hours during the  day.  Keep all follow-up visits as told by your health care provider. This is important. Contact a health care provider if:  You have a fever.  Your symptoms come back, do not improve, or get worse. Get help right away if you have:  Redness, swelling, and pain that spread to the tissues around your eye.  A sudden decrease in your vision. Summary  Dacryocystitis can be sudden (acute) or long-lasting (chronic).  The most common cause of this condition is a blocked nasolacrimal duct.  Acute dacryocystitis is treated with antibiotic medicines.  Chronic dacryocystitis usually needs to be treated with surgery.  Keep all follow-up visits as told by your health care provider. This is important. This information is not intended to replace advice given to you by your health care provider. Make sure you discuss any questions you have with your health care provider. Document Revised: 11/19/2017 Document Reviewed: 11/19/2017 Elsevier Patient Education  2020 [employer].

## 2019-10-23 ENCOUNTER — Other Ambulatory Visit: Payer: Self-pay | Admitting: Family Medicine

## 2019-10-23 DIAGNOSIS — Z1231 Encounter for screening mammogram for malignant neoplasm of breast: Secondary | ICD-10-CM

## 2019-10-26 ENCOUNTER — Ambulatory Visit
Admission: RE | Admit: 2019-10-26 | Discharge: 2019-10-26 | Disposition: A | Discharge status: Home / Self Care | Payer: 59 | Source: Ambulatory Visit | Attending: Family Medicine | Admitting: Family Medicine

## 2019-10-26 ENCOUNTER — Other Ambulatory Visit: Payer: Self-pay

## 2019-10-26 DIAGNOSIS — Z1231 Encounter for screening mammogram for malignant neoplasm of breast: Secondary | ICD-10-CM

## 2019-10-27 ENCOUNTER — Ambulatory Visit (INDEPENDENT_AMBULATORY_CARE_PROVIDER_SITE_OTHER): Payer: 59

## 2019-10-27 ENCOUNTER — Encounter: Payer: Self-pay | Admitting: Family Medicine

## 2019-10-27 DIAGNOSIS — Z23 Encounter for immunization: Secondary | ICD-10-CM | POA: Diagnosis not present

## 2019-10-29 ENCOUNTER — Other Ambulatory Visit: Payer: Self-pay | Admitting: Family Medicine

## 2019-10-29 DIAGNOSIS — R928 Other abnormal and inconclusive findings on diagnostic imaging of breast: Secondary | ICD-10-CM

## 2019-11-13 ENCOUNTER — Ambulatory Visit
Admission: RE | Admit: 2019-11-13 | Discharge: 2019-11-13 | Disposition: A | Discharge status: Home / Self Care | Payer: 59 | Source: Ambulatory Visit | Attending: Family Medicine | Admitting: Family Medicine

## 2019-11-13 ENCOUNTER — Other Ambulatory Visit: Payer: Self-pay | Admitting: Family Medicine

## 2019-11-13 ENCOUNTER — Other Ambulatory Visit: Payer: Self-pay

## 2019-11-13 DIAGNOSIS — R928 Other abnormal and inconclusive findings on diagnostic imaging of breast: Secondary | ICD-10-CM

## 2019-11-19 ENCOUNTER — Ambulatory Visit (INDEPENDENT_AMBULATORY_CARE_PROVIDER_SITE_OTHER): Payer: 59 | Admitting: Family Medicine

## 2019-11-19 ENCOUNTER — Telehealth: Payer: Self-pay | Admitting: Occupational Therapy

## 2019-11-19 ENCOUNTER — Encounter: Payer: Self-pay | Admitting: Family Medicine

## 2019-11-19 ENCOUNTER — Other Ambulatory Visit: Payer: Self-pay

## 2019-11-19 VITALS — BP 116/76 | HR 81 | Temp 97.9°F | Ht 64.0 in | Wt 187.0 lb

## 2019-11-19 DIAGNOSIS — E1065 Type 1 diabetes mellitus with hyperglycemia: Secondary | ICD-10-CM | POA: Diagnosis not present

## 2019-11-19 DIAGNOSIS — E1069 Type 1 diabetes mellitus with other specified complication: Secondary | ICD-10-CM | POA: Diagnosis not present

## 2019-11-19 DIAGNOSIS — E782 Mixed hyperlipidemia: Secondary | ICD-10-CM | POA: Diagnosis not present

## 2019-11-19 DIAGNOSIS — Z0000 Encounter for general adult medical examination without abnormal findings: Secondary | ICD-10-CM | POA: Diagnosis not present

## 2019-11-19 DIAGNOSIS — G20 Parkinson's disease: Secondary | ICD-10-CM

## 2019-11-19 LAB — CBC WITH DIFFERENTIAL/PLATELET
Absolute Monocytes: 515 cells/uL (ref 200–950)
Basophils Absolute: 47 cells/uL (ref 0–200)
Basophils Relative: 0.6 %
Eosinophils Absolute: 328 cells/uL (ref 15–500)
Eosinophils Relative: 4.2 %
HCT: 41.3 % (ref 35.0–45.0)
Hemoglobin: 13.9 g/dL (ref 11.7–15.5)
Lymphs Abs: 2465 cells/uL (ref 850–3900)
MCH: 30.4 pg (ref 27.0–33.0)
MCHC: 33.7 g/dL (ref 32.0–36.0)
MCV: 90.4 fL (ref 80.0–100.0)
MPV: 11.2 fL (ref 7.5–12.5)
Monocytes Relative: 6.6 %
Neutro Abs: 4446 cells/uL (ref 1500–7800)
Neutrophils Relative %: 57 %
Platelets: 311 10*3/uL (ref 140–400)
RBC: 4.57 10*6/uL (ref 3.80–5.10)
RDW: 12.1 % (ref 11.0–15.0)
Total Lymphocyte: 31.6 %
WBC: 7.8 10*3/uL (ref 3.8–10.8)

## 2019-11-19 MED ORDER — ROSUVASTATIN CALCIUM 20 MG PO TABS: 20.0000 mg | ORAL_TABLET | Freq: Every day | ORAL | 1 refills | Status: DC

## 2019-11-19 NOTE — Telephone Encounter (Signed)
Dr. Beryle Lathe), Chelsea Mercado was previously seen for PT/ OT and it was recommended that she returns for PT and OT evaluations 6 mons after d/c.  Pt is scheduled to return in January for these evals. If you agree please place referrals for PT and OT evals.  Thanks, [employer], OTR/L

## 2019-11-19 NOTE — Progress Notes (Signed)
Patient: Chelsea Mercado MRN: [id number] DOB: 01/23/1960 PCP: Orma Flaming, MD     Subjective:  Chief Complaint  Patient presents with  . Annual Exam  . Hyperlipidemia    HPI: The patient is a 59 y.o. female who presents today for annual exam. She denies any changes to past medical history. There have been no recent hospitalizations. They are following a well balanced diet and exercise plan. She tries to walk 10000 steps daily. Weight has been fluctuating a bit. No complaints today.  Going through a lot with abnormal mmg. Has biopsies upcoming.   Hyperlipidemia Type 1 diabetes. On pravachol. Takes daily as prescribed. Lipid panel just checked and LDL above goal at 120.    Needs foot exam for diabetes.   Had her covid vaccines in May. Also had her flu shot.   Immunization History  Administered Date(s) Administered  . Influenza Split 10/09/2014  . Influenza, High Dose Seasonal PF 09/24/2013  . Influenza,inj,Quad PF,6+ Mos 09/24/2013, 10/30/2016, 11/04/2017, 11/05/2018, 10/27/2019  . Moderna SARS-COVID-2 Vaccination 05/06/2019, 06/03/2019  . Pneumococcal Polysaccharide-23 11/19/2016   Colonoscopy: 10/11/2010 Mammogram: 10/26/2019 Pap smear: 12/31/2018. Normal with negative HPV   Review of Systems  Constitutional: Negative for chills, fatigue and fever.  HENT: Negative for dental problem, ear pain, hearing loss and trouble swallowing.   Eyes: Negative for visual disturbance.  Respiratory: Negative for cough, chest tightness and shortness of breath.   Cardiovascular: Negative for chest pain, palpitations and leg swelling.  Gastrointestinal: Negative for abdominal pain, blood in stool, diarrhea and nausea.  Endocrine: Negative for cold intolerance, polydipsia, polyphagia and polyuria.  Genitourinary: Negative for dysuria and hematuria.  Musculoskeletal: Negative for arthralgias.  Skin: Negative for rash.  Neurological: Negative for dizziness and headaches.   Psychiatric/Behavioral: Negative for dysphoric mood and sleep disturbance. The patient is not nervous/anxious.     Allergies Patient is allergic to hydrocodone-acetaminophen, propoxyphene, and tape.  Past Medical History Patient  has a past medical history of Anemia, Arthritis, Controlled type 1 diabetes mellitus without complication ([place]) (95/18/8416), Hyperlipidemia (06/22/2013), Obesity, Class I, BMI 30-34.9 (06/22/2013), PONV (postoperative nausea and vomiting), Post menopausal syndrome (06/22/2013), Primary hypothyroidism (06/22/2013), and Vitamin D deficiency (09/24/2013).  Surgical History Patient  has a past surgical history that includes Cesarean section (1985); Appendectomy; Tonsillectomy; Breast lumpectomy with radioactive seed localization (Left, 06/10/2018); and Breast excisional biopsy (Left).  Family History Pateint's family history includes CAD (age of onset: 23) in her maternal uncle; Cancer in her paternal grandfather; Coronary artery disease (age of onset: 30) in her maternal uncle; Coronary artery disease (age of onset: 93) in her mother; Heart attack in her maternal grandfather; High Cholesterol in her mother; Hyperlipidemia in her sister; Hypertension in her mother and sister; Hypothyroidism in her sister and sister; Lung cancer in her maternal uncle; Tuberculosis in her father and paternal grandmother.  Social History Patient  reports that she has never smoked. She has never used smokeless tobacco. She reports that she does not drink alcohol and does not use drugs.    Objective: Vitals:   11/19/19 1007  BP: 116/76  Pulse: 81  Temp: 97.9 F (36.6 C)  TempSrc: Temporal  SpO2: 100%  Weight: 187 lb (84.8 kg)  Height: 5\' 4"  (1.626 m)    Body mass index is 32.1 kg/m.  Physical Exam Vitals reviewed.  Constitutional:      Appearance: Normal appearance. She is well-developed. She is obese.  HENT:     Head: Normocephalic and atraumatic.     Right  Ear: Tympanic  membrane, ear canal and external ear normal.     Left Ear: Tympanic membrane, ear canal and external ear normal.     Nose: Nose normal.     Mouth/Throat:     Mouth: Mucous membranes are moist.  Eyes:     Extraocular Movements: Extraocular movements intact.     Conjunctiva/sclera: Conjunctivae normal.     Pupils: Pupils are equal, round, and reactive to light.  Neck:     Thyroid: No thyromegaly.  Cardiovascular:     Rate and Rhythm: Normal rate and regular rhythm.     Heart sounds: Normal heart sounds. No murmur heard.   Pulmonary:     Effort: Pulmonary effort is normal.     Breath sounds: Normal breath sounds.  Abdominal:     General: Bowel sounds are normal. There is no distension.     Palpations: Abdomen is soft.     Tenderness: There is no abdominal tenderness.  Musculoskeletal:     Cervical back: Normal range of motion and neck supple.  Lymphadenopathy:     Cervical: No cervical adenopathy.  Skin:    General: Skin is warm and dry.     Capillary Refill: Capillary refill takes less than 2 seconds.     Findings: No rash.  Neurological:     General: No focal deficit present.     Mental Status: She is alert and oriented to person, place, and time.     Cranial Nerves: No cranial nerve deficit.     Coordination: Coordination normal.     Deep Tendon Reflexes: Reflexes normal.  Psychiatric:        Mood and Affect: Mood normal.        Behavior: Behavior normal.      Office Visit from 11/19/2019 in [facility]  PHQ-2 Total Score 0          Diabetic Foot Exam - Simple   Simple Foot Form Diabetic Foot exam was performed with the following findings: Yes 11/19/2019 10:49 AM  Visual Inspection Sensation Testing Pulse Check Comments Normal exam     Assessment/plan: 1. Annual physical exam Labs just done at endo except CBC. Hm reviewed and UTD. Foot exam today. Going through a lot with abnormal mmg. Has biopsies upcoming.  - CBC with  Differential/Platelet; Future - CBC with Differential/Platelet  2. Mixed diabetic hyperlipidemia associated with type 1 diabetes mellitus ([facility]) Would like her LDL less than 100. Changing her over to crestor 20mg  from pravachol 40mg . She is to let me know if too expensive or if any side effects. Repeat labs in 6 months.   3. Type 1 diabetes mellitus with hyperglycemia ([place]) Foot exam normal today. otherwise followed by endocrinology.     This visit occurred during the SARS-CoV-2 public health emergency.  Safety protocols were in place, including screening questions prior to the visit, additional usage of staff PPE, and extensive cleaning of exam room while observing appropriate contact time as indicated for disinfecting solutions.     Return in about 6 months (around 05/18/2020) for lab only for cholesterol if start crestor .     Orma Flaming, MD [place]  11/19/2019

## 2019-11-19 NOTE — Patient Instructions (Addendum)
I want you to stop your pravachol. I sent in a new cholesterol drug called crestor to see if we can get your LDL under 100. It's 20mg . If any cramping or other side effects, let me know. If too expensive, do not get either.   Would recheck cholesterol and liver in 6 months.   Only need CBC today.   Hang in there with the breast stuff. im praying for you, I know this is hard. Let me know if you need anything,  Dr. Rogers Blocker

## 2019-11-20 ENCOUNTER — Encounter: Payer: Self-pay | Admitting: Family Medicine

## 2019-11-20 NOTE — Telephone Encounter (Signed)
FYI

## 2019-11-24 NOTE — Telephone Encounter (Signed)
I spoke with the pt to make sure she received the message below. Pt received message, and agrees with the plan. She also states that she is very appreciative of the Dr. Rogers Blocker and RMA.

## 2019-11-27 ENCOUNTER — Other Ambulatory Visit: Payer: Self-pay

## 2019-11-27 ENCOUNTER — Ambulatory Visit
Admission: RE | Admit: 2019-11-27 | Discharge: 2019-11-27 | Disposition: A | Discharge status: Home / Self Care | Payer: 59 | Source: Ambulatory Visit | Attending: Family Medicine | Admitting: Family Medicine

## 2019-11-27 DIAGNOSIS — R928 Other abnormal and inconclusive findings on diagnostic imaging of breast: Secondary | ICD-10-CM

## 2019-12-07 ENCOUNTER — Encounter: Payer: Self-pay | Admitting: Family Medicine

## 2019-12-07 DIAGNOSIS — N6011 Diffuse cystic mastopathy of right breast: Secondary | ICD-10-CM | POA: Insufficient documentation

## 2019-12-09 ENCOUNTER — Encounter: Payer: Self-pay | Admitting: Family Medicine

## 2019-12-27 ENCOUNTER — Other Ambulatory Visit: Payer: Self-pay | Admitting: Internal Medicine

## 2020-01-11 ENCOUNTER — Other Ambulatory Visit: Payer: Self-pay | Admitting: Internal Medicine

## 2020-01-12 ENCOUNTER — Ambulatory Visit: Payer: 59 | Admitting: Physical Therapy

## 2020-01-12 ENCOUNTER — Ambulatory Visit: Payer: 59 | Attending: Neurology | Admitting: Occupational Therapy

## 2020-01-12 ENCOUNTER — Other Ambulatory Visit: Payer: Self-pay

## 2020-01-12 ENCOUNTER — Encounter: Payer: Self-pay | Admitting: Physical Therapy

## 2020-01-12 ENCOUNTER — Encounter: Payer: Self-pay | Admitting: Occupational Therapy

## 2020-01-12 DIAGNOSIS — R29898 Other symptoms and signs involving the musculoskeletal system: Secondary | ICD-10-CM | POA: Insufficient documentation

## 2020-01-12 DIAGNOSIS — R293 Abnormal posture: Secondary | ICD-10-CM | POA: Insufficient documentation

## 2020-01-12 DIAGNOSIS — R29818 Other symptoms and signs involving the nervous system: Secondary | ICD-10-CM | POA: Insufficient documentation

## 2020-01-12 DIAGNOSIS — R2689 Other abnormalities of gait and mobility: Secondary | ICD-10-CM | POA: Diagnosis present

## 2020-01-12 DIAGNOSIS — R2681 Unsteadiness on feet: Secondary | ICD-10-CM | POA: Insufficient documentation

## 2020-01-12 NOTE — Therapy (Signed)
[facility] [address] [place], [place], [address] Phone: [phone]   Fax:  [phone]  Physical Therapy Evaluation  Patient Details  Name: Chelsea Mercado MRN: [id number] Date of Birth: 11/10/1960 Referring Provider (PT): Alonza Bogus, DO   Encounter Date: 01/12/2020   PT End of Session - 01/12/20 1220    Visit Number 1    Number of Visits 1    PT Start Time 1015    PT Stop Time 1056    PT Time Calculation (min) 41 min    Activity Tolerance Patient tolerated treatment well    Behavior During Therapy [facility] for tasks assessed/performed           Past Medical History:  Diagnosis Date  . Anemia   . Arthritis   . Controlled type 1 diabetes mellitus without complication ([place]) [id number]  . Hyperlipidemia 06/22/2013  . Obesity, Class I, BMI 30-34.9 06/22/2013  . PONV (postoperative nausea and vomiting)   . Post menopausal syndrome 06/22/2013  . Primary hypothyroidism 06/22/2013  . Vitamin D deficiency 09/24/2013    Past Surgical History:  Procedure Laterality Date  . APPENDECTOMY    . BREAST EXCISIONAL BIOPSY Left    papilloma  . BREAST LUMPECTOMY WITH RADIOACTIVE SEED LOCALIZATION Left 06/10/2018   Procedure: LEFT BREAST LUMPECTOMY WITH RADIOACTIVE SEED LOCALIZATION;  Surgeon: Erroll Luna, MD;  Location: [facility];  Service: General;  Laterality: Left;  . CESAREAN SECTION  1985  . TONSILLECTOMY      There were no vitals filed for this visit.    Subjective Assessment - 01/12/20 1022    Subjective Nothing new in the past 6 months.  No changes.  Walking for exercise, sometimes using the elliptical.  Off and on, doing the PT exercises (had to hold off some due to potential breast surgery).  One fall in the past 6 months.  Shoe got caught in a brick of the parking lot of grocery store.  No injury and was able to get up on my own.    Patient Stated Goals no specific goals    Currently in Pain?  No/denies              [facility] PT Assessment - 01/12/20 1025      Assessment   Medical Diagnosis Parkinson's disease    Referring Provider (PT) Wells Guiles Tat, DO    Onset Date/Surgical Date 06/16/19    Hand Dominance Right    Prior Therapy 06/16/19 last OT      Precautions   Precautions Fall      Balance Screen   Has the patient fallen in the past 6 months Yes    How many times? 1    Has the patient had a decrease in activity level because of a fear of falling?  No    Is the patient reluctant to leave their home because of a fear of falling?  No      Home Social worker Private residence    Living Arrangements Spouse/significant other    Available Help at Discharge Family    Type of [place] to enter    Entrance Stairs-Number of Steps 2    [place] Two level;Able to live on main level with bedroom/bathroom      Prior Function   Level of Independence Independent    Vocation Requirements Not currently working; has worked in [employer] profession previously  Leisure Enjoys walking daily (for about 45 minutes); has workout equipment at home      Observation/Other Assessments   Focus on Therapeutic Outcomes (FOTO)  NA      Posture/Postural Control   Posture/Postural Control Postural limitations    Postural Limitations Rounded Shoulders      ROM / Strength   AROM / PROM / Strength Strength;AROM      AROM   Overall AROM  Within functional limits for tasks performed    Overall AROM Comments BLEs grossly WNL      Strength   Overall Strength Within functional limits for tasks performed      Transfers   Transfers Sit to Stand;Stand to Sit    Sit to Stand 6: Modified independent (Device/Increase time);Without upper extremity assist;From chair/3-in-1    Five time sit to stand comments  8.07    Stand to Sit 6: Modified independent (Device/Increase time);Without upper extremity assist;To chair/3-in-1      Ambulation/Gait    Ambulation/Gait Yes    Ambulation/Gait Assistance 7: Independent    Ambulation Distance (Feet) 300 Feet    Assistive device None    Gait Pattern Step-through pattern    Ambulation Surface Level;Indoor    Gait velocity 10.07 sec =3.26 ft/sec      Mini-BESTest   Sit To Stand Normal: Comes to stand without use of hands and stabilizes independently.    Rise to Toes Normal: Stable for 3 s with maximum height.    Stand on one leg (left) Moderate: < 20 s   15.28, 13.62   Stand on one leg (right) Normal: 20 s.    Stand on one leg - lowest score 1    Compensatory Stepping Correction - Forward Moderate: More than one step is required to recover equilibrium    Compensatory Stepping Correction - Backward Moderate: More than one step is required to recover equilibrium    Compensatory Stepping Correction - Left Lateral Moderate: Several steps to recover equilibrium    Compensatory Stepping Correction - Right Lateral Moderate: Several steps to recover equilibrium    Stepping Corredtion Lateral - lowest score 1    Stance - Feet together, eyes open, firm surface  Normal: 30s    Stance - Feet together, eyes closed, foam surface  Normal: 30s    Incline - Eyes Closed Normal: Stands independently 30s and aligns with gravity    Change in Gait Speed Normal: Significantly changes walkling speed without imbalance    Walk with head turns - Horizontal Normal: performs head turns with no change in gait speed and good balance    Walk with pivot turns Normal: Turns with feet close FAST (< 3 steps) with good balance.    Step over obstacles Normal: Able to step over box with minimal change of gait speed and with good balance.    Timed UP & GO with Dual Task Normal: No noticeable change in sitting, standing or walking while backward counting when compared to TUG without    Mini-BEST total score 24      Timed Up and Go Test   TUG Normal TUG;Cognitive TUG    Normal TUG (seconds) 10.25    Cognitive TUG (seconds) 11.13     TUG Comments Scores >13.5-15 sec indicate increased fall risk.                      Objective measurements completed on examination: See above findings.  PT Education - 01/12/20 1218    Education Details Eval results, provided info on local Parkinson's resources, including PD cycling class; brief review of [employer] HEP; info on screens in 6-8 months    Person(s) Educated Patient    Methods Explanation    Comprehension Verbalized understanding               PT Long Term Goals - 01/12/20 1224      PT LONG TERM GOAL #1   Title Pt will verbalize understanding of previous HEP and community fitness resources for Parkinson's.    Time 1    Period Days    Status Achieved                  Plan - 01/12/20 1221    Clinical Impression Statement Pt is a 60 year old female with history of Parkinson's disease, who presents for return PT eval as recommended from discharge from PT in June 2021.  Pt presents today with functional measures very similar to measures at previous discharge.  She does not rate any pain.  She has had one fall in past 6 months (shoe got caught in an uneven brick in parking lot); she is at low fall risk per MiniBESTest.  Educated pt in community fitness resources for PD and briefly reviewed HEP.  Pt does not appear to have any skilled PT needs at this time; recommend return PD screens in 6-8 months.    Personal Factors and Comorbidities Comorbidity 3+    Comorbidities See problem list    Stability/Clinical Decision Making Stable/Uncomplicated    Clinical Decision Making Low    Rehab Potential Good    PT Frequency One time visit    PT Treatment/Interventions ADLs/Self Care Home Management;Gait training;Functional mobility training;Therapeutic activities;Therapeutic exercise;Balance training;Neuromuscular re-education;Patient/family education    PT Next Visit Plan Discharge this visit.    Consulted and Agree with Plan of  Care Patient           Patient will benefit from skilled therapeutic intervention in order to improve the following deficits and impairments:  Abnormal gait,Decreased coordination,Impaired tone,Decreased mobility  Visit Diagnosis: Other abnormalities of gait and mobility     Problem List Patient Active Problem List   Diagnosis Date Noted  . Ductal papillomatosis of breast, right 12/07/2019  . Parkinson's disease ([place]) 11/20/2018  . Family history of premature CAD 03/19/2018  . DOE (dyspnea on exertion) 03/19/2018  . Left thyroid nodule 10/01/2016  . Varicose veins of bilateral lower extremities with other complications [id number]  . Vitamin D deficiency 09/24/2013  . Type 1 diabetes mellitus with hyperglycemia ([place]) 06/22/2013  . Mixed diabetic hyperlipidemia associated with type 1 diabetes mellitus ([place]) 06/22/2013  . Obesity, Class I, BMI 30-34.9 06/22/2013  . Primary hypothyroidism 06/22/2013    Pasquale Matters W. 01/12/2020, 12:25 PM  Frazier Butt., PT   [facility] [address] [place], [place], [address] Phone: [phone]   Fax:  [phone]  Name: Sharli Dillahunt MRN: [id number] Date of Birth: 03-16-1960

## 2020-01-12 NOTE — Therapy (Signed)
[facility] [address] [place], [place], [address] Phone: [phone]   Fax:  [phone]  Occupational Therapy Evaluation  Patient Details  Name: Chelsea Mercado MRN: [id number] Date of Birth: 02-03-1960 Referring Provider (OT): Dr. Arbutus Leasat   Encounter Date: 01/12/2020   OT End of Session - 01/12/20 1159    Visit Number 1   eval only   Authorization Type [employer]: 20 visit limit for OT    OT Start Time 0936    OT Stop Time 1016    OT Time Calculation (min) 40 min    Activity Tolerance Patient tolerated treatment well    Behavior [facility] for tasks assessed/performed           Past Medical History:  Diagnosis Date  . Anemia   . Arthritis   . Controlled type 1 diabetes mellitus without complication ([facility]) 06/22/2013  . Hyperlipidemia 06/22/2013  . Obesity, Class I, BMI 30-34.9 06/22/2013  . PONV (postoperative nausea and vomiting)   . Post menopausal syndrome 06/22/2013  . Primary hypothyroidism 06/22/2013  . Vitamin D deficiency 09/24/2013    Past Surgical History:  Procedure Laterality Date  . APPENDECTOMY    . BREAST EXCISIONAL BIOPSY Left    papilloma  . BREAST LUMPECTOMY WITH RADIOACTIVE SEED LOCALIZATION Left 06/10/2018   Procedure: LEFT BREAST LUMPECTOMY WITH RADIOACTIVE SEED LOCALIZATION;  Surgeon: Harriette Bouillonornett, Thomas, MD;  Location: [facility];  Service: General;  Laterality: Left;  . CESAREAN SECTION  1985  . TONSILLECTOMY      There were no vitals filed for this visit.   Subjective Assessment - 01/12/20 0949    Subjective  Pt reports no pain recently.  Pt also requests d/c today as pt no longer has insurance after today.    Patient Stated Goals maintain abilities with LUE    Currently in Pain? No/denies             [facility] Assessment - 01/12/20 0940      Assessment   Medical Diagnosis Parkinson's disease    Referring Provider (OT) Dr. Arbutus Leasat    Onset Date/Surgical Date  07/08/19    Hand Dominance Right    Prior Therapy 07/08/19 last OT      Precautions   Precautions Fall      Balance Screen   Has the patient fallen in the past 6 months Yes    How many times? 1-wearing sandals and tripped on raised brick      Home  Environment   Family/patient expects to be discharged to: Private residence    Lives With Family   husband and son     Prior Function   Level of Independence Independent    Vocation --   not currently working   Leisure --   walking in neighborhood, read, doing therapy exercises inconsistently     ADL   Eating/Feeding Modified independent    Grooming Modified independent    Upper Body Bathing Modified independent    Lower Body Bathing Modified independent    Upper Body Dressing Independent    Lower Body Dressing Modified independent    Toilet Transfer Modified independent;Independent    Toileting - Art therapistClothing Manipulation Modified independent    Tub/Shower Transfer Modified independent    Tub/Shower Transfer Equipment Walk in shower    Transfers/Ambulation Related to ADL's no difficulty with bed difficulty, getting up from floor      IADL   Shopping Takes care of all shopping needs  independently    Light Housekeeping Maintains house alone or with occasional assistance   mod I   Meal Prep Plans, prepares and serves adequate meals independently    Community Mobility Drives own vehicle    Medication Management Is responsible for taking medication in correct dosages at correct time    Development worker, community financial matters independently (budgets, writes checks, pays rent, bills goes to bank), collects and keeps track of income      Mobility   Mobility Status Independent      Written Expression   Dominant Hand Right    Handwriting --   pt denies difficulty     Vision - History   Baseline Vision Wears glasses only for reading    Additional Comments WFL per pt report      Activity Tolerance   Activity Tolerance Comments  denies fatigue      Cognition   Overall Cognitive Status Within Functional Limits for tasks assessed    Cognition Comments able to read/watch tv at the same time      Observation/Other Assessments   Standing Functional Reach Test R-10", L-9.5"    Simulated Eating Time (seconds) 9.34sec   holds spoon at end   Donning Doffing Jacket Time (seconds) 11.43sec    Donning Doffing Jacket Comments 17.88sec      Posture/Postural Control   Posture/Postural Control Postural limitations    Postural Limitations Rounded Shoulders      Coordination   Right 9 Hole Peg Test 17.82    Left 9 Hole Peg Test 19.91    Box and Blocks RUE 65, LUE 62    Tremors resting tremor LUE      ROM / Strength   AROM / PROM / Strength AROM      AROM   Overall AROM  Within functional limits for tasks performed    Overall AROM Comments BUEs grossly WNL      LUE Tone   LUE Tone Mild   rigidity     RLE Tone   RLE Tone Within Functional Limits                           OT Education - 01/12/20 1155    Education Details OT eval results/recommendations; emphasized keeping feet apart in standing to improve balance and ability to reach, resume HEP regularly and use big movements with functional tasks; indications for return to occupational therapy sooner than scheduled follow-up    Person(s) Educated Patient    Methods Explanation;Demonstration    Comprehension Verbalized understanding               OT Long Term Goals - 07/08/19 1323      OT LONG TERM GOAL #1   Title I with PD specific HEP    Time 5    Period Weeks    Status Achieved      OT LONG TERM GOAL #2   Title Pt will verbalize understanding of adapted strategies to maximize safety and I with ADLs/ IADLs .    Time 5    Period Weeks    Status Achieved      OT LONG TERM GOAL #3   Title Pt will verbalize understanding of ways to prevent future PD related complications and PD community resources.    Status Achieved      OT  LONG TERM GOAL #4   Title Pt will rdemonstrate ability to perform shoulder flexion and abduction at  120  or greater for ADLs and exercises with no consistent pain    Baseline Pt has had inconsistent pain with end range reaching    Time 5    Period Weeks    Status Achieved   L-155* with full elbow ext and no pain                Plan - 01/12/20 1200    Clinical Impression Statement Pt is a 60 y.o. female with Parkinson's disease.  Pt is familiar to this therapist/clinic from last occupational therapy 06/2019.  It was recommended at OT d/c that pt return for re-evaluation in approx 6 months due to progressive nature of diagnosis and as pt ended OT early due to financial/insurance concerns.  Pt with PMH that includes:type 1 diabetes, hyperlipidemia, obesity, hypothroidism, vit D deficiency, L thyroid nodule, ductal papilomatosis R and L breast, swollen lympth glands L axilla, arthritis.  Pt presents today with mild decr posture, bradykinesia, rigidity, and balance deficits.  However, pt demo no significant decline in objective measures since last seen in occupational therapy and reports performing BADLs and IADLs without significant difficulty or change.  Therefore, no occupational therapy recommended at this time.    OT Occupational Profile and History Detailed Assessment- Review of Records and additional review of physical, cognitive, psychosocial history related to current functional performance    Occupational performance deficits (Please refer to evaluation for details): IADL's;ADL's;Leisure    Body Structure / Function / Physical Skills ADL;UE functional use;Flexibility;Coordination;IADL;Tone   all mild   Clinical Decision Making Limited treatment options, no task modification necessary    Comorbidities Affecting Occupational Performance: May have comorbidities impacting occupational performance    Modification or Assistance to Complete Evaluation  No modification of tasks or assist necessary  to complete eval    OT Frequency One time visit   eval only   OT Treatment/Interventions Self-care/ADL training    Plan OT screen recommended in approx 6-9 months    Consulted and Agree with Plan of Care Patient           Patient will benefit from skilled therapeutic intervention in order to improve the following deficits and impairments:   Body Structure / Function / Physical Skills: ADL,UE functional use,Flexibility,Coordination,IADL,Tone (all mild)       Visit Diagnosis: Other symptoms and signs involving the nervous system  Unsteadiness on feet  Abnormal posture  Other symptoms and signs involving the musculoskeletal system    Problem List Patient Active Problem List   Diagnosis Date Noted  . Ductal papillomatosis of breast, right 12/07/2019  . Parkinson's disease ([place]) 11/20/2018  . Family history of premature CAD 03/19/2018  . DOE (dyspnea on exertion) 03/19/2018  . Left thyroid nodule 10/01/2016  . Varicose veins of bilateral lower extremities with other complications [id number]  . Vitamin D deficiency 09/24/2013  . Type 1 diabetes mellitus with hyperglycemia ([place]) 06/22/2013  . Mixed diabetic hyperlipidemia associated with type 1 diabetes mellitus ([place]) 06/22/2013  . Obesity, Class I, BMI 30-34.9 06/22/2013  . Primary hypothyroidism 06/22/2013    [facility] 01/12/2020, 12:08 PM  [facility] [address] [place], [place], [address] Phone: [phone]   Fax:  [phone]  Name: Chelsea Mercado MRN: [id number] Date of Birth: [birth year]   Vianne Bulls, OTR/L [facility] [address] [place], [place]  [address] [phone] phone [phone] 01/12/20 12:09 PM

## 2020-01-14 ENCOUNTER — Ambulatory Visit: Payer: 59 | Admitting: Internal Medicine

## 2020-01-14 ENCOUNTER — Encounter: Payer: Self-pay | Admitting: Internal Medicine

## 2020-01-14 ENCOUNTER — Other Ambulatory Visit: Payer: Self-pay

## 2020-01-14 VITALS — BP 120/70 | HR 82 | Ht 64.0 in | Wt 186.0 lb

## 2020-01-14 DIAGNOSIS — E782 Mixed hyperlipidemia: Secondary | ICD-10-CM

## 2020-01-14 DIAGNOSIS — E1065 Type 1 diabetes mellitus with hyperglycemia: Secondary | ICD-10-CM

## 2020-01-14 DIAGNOSIS — E041 Nontoxic single thyroid nodule: Secondary | ICD-10-CM | POA: Diagnosis not present

## 2020-01-14 DIAGNOSIS — E1069 Type 1 diabetes mellitus with other specified complication: Secondary | ICD-10-CM | POA: Diagnosis not present

## 2020-01-14 DIAGNOSIS — E039 Hypothyroidism, unspecified: Secondary | ICD-10-CM | POA: Diagnosis not present

## 2020-01-14 LAB — POCT GLYCOSYLATED HEMOGLOBIN (HGB A1C): Hemoglobin A1C: 7.6 % — AB (ref 4.0–5.6)

## 2020-01-14 NOTE — Patient Instructions (Addendum)
Please continue: - basal rates: 12 AM: 0.775 units/h 2 AM: 0.800 5 AM: 0.950 9 AM: 0.775  - target: 100-120 - ISF: 50 - Active insulin time: 3 hours  Change:  - ICR: 1:10 except 12-2 pm: 1:9  Please enter all carbs into the pump, including snacks and desserts.  Please do the following approximately 15 minutes before every meal: - Enter carbs (C) - Enter sugars (S) - Start insulin bolus (I)  If you exercise, decrease the basal rate to 50% for the duration of exercise and 1 hour afterwards. If you exercise within 1.5 h after a meal, decrease the amount of carbs you enter for that meal.  Please continue levothyroxine 125 mcg daily.  Take the thyroid hormone every day, with water, at least 30 minutes before breakfast, separated by at least 4 hours from: - acid reflux medications - calcium - iron - multivitamins  Please return in 3-4 months.

## 2020-01-14 NOTE — Addendum Note (Signed)
Addended by: Kenyon Ana on: 01/14/2020 02:41 PM   Modules accepted: Orders

## 2020-01-14 NOTE — Progress Notes (Signed)
Patient ID: Chelsea Mercado, female   DOB: 10-Jul-1960, 60 y.o.   MRN: [id number]   This visit occurred during the SARS-CoV-2 public health emergency.  Safety protocols were in place, including screening questions prior to the visit, additional usage of staff PPE, and extensive cleaning of exam room while observing appropriate contact time as indicated for disinfecting solutions.   HPI: Chelsea Mercado is a 60 y.o.-year-old female, returning for f/u for DM1, dx'ed at 60 y/o, uncontrolled, without long term complications and hypothyroidism. She saw endocrinology before: Dr. Cherie Dark.  Last visit with me 4 months ago.  At last visit, her sugars were better after she started to improve her diet, eating less bread, eliminating coffee, in the previous months.  She was in outpatient rehab for disequilibrium/Parkinson's ds. >> just finished. She is also trying to walk 10,000 steps a day (getting usually >5000 steps).    She will have another breast surgery (benign mass removal).  Reviewed HbA1c levels: Lab Results  Component Value Date   HGBA1C 7.7 (A) 09/10/2019   HGBA1C 8.0 (A) 04/30/2019   HGBA1C 7.8 (A) 12/29/2018  02/14/2016: HbA1c 7.6%  Insulin pump: -Previously Medtronic 630 G- on her arm -started 2016-2017, newer pump started 01/2017 -Change to Medtronic 770 G on 04/30/2019   CGM: -She is not wearing the CGM as she mentions that she does not have enough place on her body to wear both the pump and the CGM  Insulin: -Humalog  Pump settings: - basal rates: 12 AM: 0.775 2 AM: 0.800 5 AM: 0.950 9 AM: 0.775  - ICR: 1:10 - target: 100-120 - ISF: 50 - Active insulin time: 3 hours  - bolus wizard: on TDD from basal insulin:  60% (19 units) >> 60% >> 60% >> 60% TDD from bolus insulin: 40% (13 units) >> 40% >> 40% >> 40% Total daily dose: 31.7-50 >> up to 50 units - changes infusion site: q 3-4 days  - Meter: Bayer Contour >> AccuChek Guide -this is not communicating with her mom  Pt checks  her sugars 4 times a day-  ave149 +/-55 >> 140 +/- 43 >> 151 +/- 51: - am: 71, 79-154, 166, 189, 215 >> 80-213, 256 - 2h after b'fast: n/c >> 115 - lunch: 78-157, 177 >> 93-229 - 2h after lunch: >> 176 >> 223 - dinner: 82-167, 208 >> 118-235 - 2h after dinner:171, 177 >> 69 x1, 82-214 - bedtime: 111, 185 >> see above     Prev.:   Prev.:   Lowest sugar was  58 >> 70 >> 50s (after walking); she has hypoglycemia awareness in the 60s.  She had 1 previous ED visit for hypoglycemia many years ago.  She has inhaled glucagon at home. Highest sugar was 556 (infusion site pbs) ...>> 350 (?) >> 300.  No previous DKA admissions.  Pt's meals are: - Breakfast: egg, bread, milk; milk + cereals; coffee - Lunch: Sandwich, soup, salad, fruit, chips; milk - Dinner: Meat, potatoes, brown rice, veggies or salad - Snacks: 1 She was walking 2-3 times a day (after meals) up to 1 hour a day, but stopped during the coronavirus pandemic.  She started exercise before last visit.  -No CKD, last BUN/creatinine:  Lab Results  Component Value Date   BUN 19 09/10/2019   BUN 17 11/05/2018   CREATININE 0.89 09/10/2019   CREATININE 0.95 11/05/2018   On ramipril 10. -+ HL. Last set of lipids: Lab Results  Component Value Date   CHOL 157 09/10/2019  HDL 55.70 09/10/2019   LDLCALC 83 09/10/2019   TRIG 90.0 09/10/2019   CHOLHDL 3 09/10/2019   On atorvastatin 40. - last eye exam was in 2021: No DR reportedly - Dr. Prudencio Burly - No numbness and tingling in her feet.  Pt has no FH of DM.  Hypothyroidism:  Reviewed her TFTs: Lab Results  Component Value Date   TSH 0.80 09/10/2019  02/10/2016: TSH 6.671 08/06/2015: TSH 4.35 03/26/2015: TSH 4.37 09/28/2014: TSH 2.73  Pt is on levothyroxine 125 mcg daily, taken: - in am - fasting - at least 30 min from b'fast - no calcium - no iron - no multivitamins - no PPIs - not on Biotin  She has a history of vitamin D deficiency Lab Results  Component  Value Date   VD25OH 63.7 09/10/2019   VD25OH 57.81 09/01/2018   VD25OH 47.47 10/01/2016  Prev: 02/10/2016: vit D 31 08/06/2015: vit D 21  She continues on 5000 units vitamin D daily.  She saw Dr. Brantley Stage for removal of a premalignant breast lesion.  She had breast lumpectomy with radioactive seed localization.  She sees Dr. Carles Collet for Parkinson's disease.  Her mother had Merkel Cell CA - stage 4. She passed away 01-29-2018.  ROS: Constitutional: no weight gain/no weight loss, no fatigue, no subjective hyperthermia, no subjective hypothermia Eyes: no blurry vision, no xerophthalmia ENT: no sore throat, no nodules palpated in neck, no dysphagia, no odynophagia, no hoarseness Cardiovascular: no CP/no SOB/no palpitations/no leg swelling Respiratory: no cough/no SOB/no wheezing Gastrointestinal: no N/no V/no D/no C/no acid reflux Musculoskeletal: no muscle aches/no joint aches Skin: no rashes, no hair loss Neurological: + tremors/no numbness/no tingling/no dizziness  I reviewed pt's medications, allergies, PMH, social hx, family hx, and changes were documented in the history of present illness. Otherwise, unchanged from my initial visit note..  Past Medical History:  Diagnosis Date  . Anemia   . Arthritis   . Controlled type 1 diabetes mellitus without complication ([place]) [id number]  . Hyperlipidemia 06/22/2013  . Obesity, Class I, BMI 30-34.9 06/22/2013  . PONV (postoperative nausea and vomiting)   . Post menopausal syndrome 06/22/2013  . Primary hypothyroidism 06/22/2013  . Vitamin D deficiency 09/24/2013   Past Surgical History:  Procedure Laterality Date  . APPENDECTOMY    . BREAST EXCISIONAL BIOPSY Left    papilloma  . BREAST LUMPECTOMY WITH RADIOACTIVE SEED LOCALIZATION Left 06/10/2018   Procedure: LEFT BREAST LUMPECTOMY WITH RADIOACTIVE SEED LOCALIZATION;  Surgeon: Erroll Luna, MD;  Location: [facility];  Service: General;  Laterality: Left;  . CESAREAN  SECTION  1985  . TONSILLECTOMY     Social History   Social History  . Marital status: Married    Spouse name: N/A  . Number of children: 3   Occupational History  . Homemaker    Social History Main Topics  . Smoking status: Never Smoker  . Smokeless tobacco: Never Used  . Alcohol use No  . Drug use: No   Current Outpatient Medications on File Prior to Visit  Medication Sig Dispense Refill  . azelastine (ASTELIN) 0.1 % nasal spray Place 2 sprays into both nostrils 2 (two) times daily. Use in each nostril as directed 30 mL 12  . Cholecalciferol (VITAMIN D3) 5000 units CAPS Take 1 capsule by mouth daily.     Marland Kitchen doxycycline (VIBRAMYCIN) 100 MG capsule Take 1 capsule (100 mg total) by mouth 2 (two) times daily. (Patient not taking: Reported on 01/12/2020) 20 capsule 0  .  Glucagon 3 MG/DOSE POWD Place 3 mg into the nose once as needed for up to 1 dose. 1 each 11  . glucose blood (ACCU-CHEK GUIDE) test strip Use as instructed 3-4x a day 300 each 5  . ibuprofen (ADVIL) 800 MG tablet Take 1 tablet (800 mg total) by mouth every 8 (eight) hours as needed. 30 tablet 0  . insulin lispro (HUMALOG) 100 UNIT/ML injection USE AS DIRECTED PER PUMP TAKE MAXIMUM 50 UNITS DAILY 60 mL 1  . Insulin Pen Needle (PEN NEEDLES) 32G X 4 MM MISC 1 each by Does not apply route daily. 100 each 11  . Insulin Syringe-Needle U-100 (INSULIN SYRINGE .3CC/31GX5/16") 31G X 5/16" 0.3 ML MISC Use to inject insulin if needed for back up of pump. 100 each 0  . levothyroxine (SYNTHROID) 125 MCG tablet TAKE 1 TABLET BY MOUTH EVERY DAY 90 tablet 3  . naproxen (NAPROSYN) 500 MG tablet Take 1 tablet (500 mg total) by mouth 2 (two) times daily with a meal. (Patient taking differently: Take 500 mg by mouth 2 (two) times daily with a meal. prn) 60 tablet 1  . pravastatin (PRAVACHOL) 40 MG tablet Take 1 tablet (40 mg total) by mouth daily. 90 tablet 3  . ramipril (ALTACE) 10 MG capsule TAKE 1 CAPSULE BY MOUTH EVERY DAY 90 capsule 3  .  vitamin B-12 (CYANOCOBALAMIN) 500 MCG tablet Take 1,000 mcg by mouth daily. (Patient not taking: Reported on 01/12/2020)     No current facility-administered medications on file prior to visit.   Allergies  Allergen Reactions  . Hydrocodone-Acetaminophen Nausea And Vomiting  . Propoxyphene Nausea Only  . Tape Rash   Family History  Problem Relation Age of Onset  . Hypertension Mother   . High Cholesterol Mother   . Coronary artery disease Mother 47       CABG  . Tuberculosis Father   . Heart attack Maternal Grandfather        Died from MI  . Coronary artery disease Maternal Uncle 75       CABG  . Lung cancer Maternal Uncle        asbestosis  . CAD Maternal Uncle 75       died - coronary aneurysm  . Hyperlipidemia Sister   . Hypertension Sister   . Hypothyroidism Sister   . Tuberculosis Paternal Grandmother   . Cancer Paternal Grandfather   . Hypothyroidism Sister    PE: BP 120/70   Pulse 82   Ht 5\' 4"  (1.626 m)   Wt 186 lb (84.4 kg)   SpO2 97%   BMI 31.93 kg/m  Wt Readings from Last 3 Encounters:  01/14/20 186 lb (84.4 kg)  11/19/19 187 lb (84.8 kg)  09/23/19 187 lb (84.8 kg)   Constitutional: overweight, in NAD Eyes: PERRLA, EOMI, no exophthalmos ENT: moist mucous membranes, no thyromegaly, no cervical lymphadenopathy Cardiovascular: RRR, No MRG Respiratory: CTA B Gastrointestinal: abdomen soft, NT, ND, BS+ Musculoskeletal: no deformities, strength intact in all 4 Skin: moist, warm, no rashes Neurological: + tremor with outstretched hands, DTR normal in all 4  ASSESSMENT: 1. DM1, uncontrolled, without long term complications, but with hyperglycemia  2. Hypothyroidism  3. Thyroid nodule  4.  Hyperlipidemia  PLAN:  1. Patient with longstanding, uncontrolled, type 1 diabetes, on an insulin pump, without the CGM.  She wears her Medtronic pump on her arm due to previous problems with infusion sets.  At last visit, sugars were better, improved in the previous  2  weeks after improving her diet.  HbA1c was better, at 7.7%, but the projected HbA1c from the previous 2 weeks was better, lower than 7%.  I strongly encouraged her to continue the diet and also discussed about the importance of bolusing 15 minutes before meals.  We also discussed about lowering the basal rates for the duration of exercise and 1 hour afterwards to avoid low blood sugars after exercise.  She was also not entering the entire amount of carbs with meals and I advised her to do so.  We did not change the regimen otherwise. -At today's visit, we reviewed together her pump downloads it appears that her sugars are mostly at goal in the morning, they are variable before lunch but still close to goal, and they are higher after lunch.  She is occasionally forgetting to enter carbs whenever she eats lunch or dinner, but her sugars are high after this meal even when she boluses for it.  I advised her to change the insulin to carb ratio with lunch so that she gets more insulin for the meal: from 1:10 to 1:9.  Upon questioning, she feels that she is introducing an accurate amount of carbs for the meals -We also discussed about how to adjust her insulin delivery before exercise.  She is planning to start a cycling class at around 1 PM, after her lunch.  We discussed that we have several options but I did recommend to take less insulin for lunch in the days that she plans to exercise.  She can do this most easily by entering a lower amount of carbs than she actually eats.  I gave her several examples.  Another option would be (especially when she is doing light exercise) to decrease the basal rate before exercise and up to 1 hours afterwards.  We discussed about doing a temporary basal rate of approximately 50% to start with and modified afterwards depending on her blood sugars. -We also discussed about her upcoming surgery to extract a benign breast mass.  I advised her to keep the same pump settings but not  bolus for meals in the day of the surgery, she should be n.p.o.  After her previous similar surgery, her sugars were high after she was given steroids.  I advised her to check whether the surgery can be done without steroids.  If not, I advised her that we may need to strengthen insulin to carb ratio to 1:7 or 1:8.  I advised her to let me know if sugars increase after surgery -She also inquires whether she should have the COVID-19 booster (second dose was in 05/2019).  I strongly advised her to get this. -I advised her to:  Patient Instructions  Please continue: - basal rates: 12 AM: 0.775 units/h 2 AM: 0.800 5 AM: 0.950 9 AM: 0.775  - target: 100-120 - ISF: 50 - Active insulin time: 3 hours  Change:  - ICR: 1:10 except 12-2 pm: 1:9  Please enter all carbs into the pump, including snacks and desserts.  Please do the following approximately 15 minutes before every meal: - Enter carbs (C) - Enter sugars (S) - Start insulin bolus (I)  If you exercise, decrease the basal rate to 50% for the duration of exercise and 1 hour afterwards. If you exercise within 1.5 h after a meal, decrease the amount of carbs you enter for that meal.  Please continue levothyroxine 125 mcg daily.  Take the thyroid hormone every day, with water, at least 30 minutes  before breakfast, separated by at least 4 hours from: - acid reflux medications - calcium - iron - multivitamins  Please return in 3-4 months.   - we checked her HbA1c: 7.6% (slightly lower) - advised to check sugars at different times of the day - 4x a day, rotating check times - she has an Accu-Chek guide meter and she would like to switch back to the meter that communicates with her pump.  We will send this to her pharmacy and may need a preauthorization for this - advised for yearly eye exams >> she is UTD - return to clinic in 3-4 months     2.  Hypothyroidism  - latest thyroid labs reviewed with pt >> normal: Lab Results  Component  Value Date   TSH 0.80 09/10/2019   - she continues on LT4 125 mcg daily - pt feels good on this dose. - we discussed about taking the thyroid hormone every day, with water, >30 minutes before breakfast, separated by >4 hours from acid reflux medications, calcium, iron, multivitamins. Pt. is taking it correctly.  3. H/o Thyroid nodule -She denies neck compression symptoms -Resolved on the latest thyroid ultrasound -No further investigation is needed for now  4. HL -Reviewed latest lipid panel from 08/2018: LDL above goal, the rest of the fractions at goal: Lab Results  Component Value Date   CHOL 157 09/10/2019   HDL 55.70 09/10/2019   LDLCALC 83 09/10/2019   TRIG 90.0 09/10/2019   CHOLHDL 3 09/10/2019  -Continues Crestor 20 without side effects.  This was switched from Pravastatin in 11/2019.  Total time spent for the visit: 40 min, in reviewing her pump downloads, discussing her hyper-glycemic episodes, reviewing previous labs and pump settings and developing a plan to avoid hypo- and hyper-glycemia.  We also addressed the other topics listed above.  Carlus Pavlov, MD PhD [facility]

## 2020-01-18 ENCOUNTER — Telehealth: Payer: Self-pay | Admitting: *Deleted

## 2020-01-18 ENCOUNTER — Other Ambulatory Visit: Payer: Self-pay | Admitting: Internal Medicine

## 2020-01-18 NOTE — Telephone Encounter (Signed)
OK to fill. Ty!

## 2020-01-28 ENCOUNTER — Ambulatory Visit: Payer: 59 | Admitting: Family Medicine

## 2020-01-28 ENCOUNTER — Other Ambulatory Visit: Payer: Self-pay

## 2020-01-28 ENCOUNTER — Encounter: Payer: Self-pay | Admitting: Family Medicine

## 2020-01-28 VITALS — BP 112/58 | HR 75 | Temp 97.1°F | Ht 64.0 in | Wt 185.6 lb

## 2020-01-28 DIAGNOSIS — M79672 Pain in left foot: Secondary | ICD-10-CM | POA: Diagnosis not present

## 2020-01-28 DIAGNOSIS — L810 Postinflammatory hyperpigmentation: Secondary | ICD-10-CM | POA: Diagnosis not present

## 2020-01-28 DIAGNOSIS — M79671 Pain in right foot: Secondary | ICD-10-CM | POA: Diagnosis not present

## 2020-01-28 NOTE — Patient Instructions (Signed)
-  referral to podiatry done to dr. Ellard Artis.  -f/u with dr. Carles Collet about possible EMG for neuropathy.    -area of color looks like post inflammatory hyperpigmentation. Over time this will go away. Can try bio oil, but I don't think this helps much.   -discuss lymph node with surgeon.   So good to see you! Dr. Rogers Blocker

## 2020-01-28 NOTE — Progress Notes (Signed)
Patient: Chelsea Mercado MRN: [id number] DOB: [birth year] PCP: Orma Flaming, MD     Subjective:  Chief Complaint  Patient presents with  . Referral    Podiatry  . Foot Pain  . Breast Problem    Swollen glands; left side.    HPI: The patient is a 60 y.o. female who presents today for swollen glands on the left side of her breast. She says that she has already had biopsy's. He also had a Diagnostic Mammo in November, and is scheduled to see a surgeon on Monday. She wants to discuss next steps. She also complains of bilateral foot pain, and wants to rule out neuropathy. She is requesting a referral to Podiatry.    She is still having bilateral pain in her ankles and has burning on the bilateral ankles. She is concerned about neuropathy. She doesn't have symptoms daily. She also feels like the arch in her feet have fallen. She has no pain in the bottom of her feet. She has pain into her toes and pain is always on top of feet. She has no numbness or tingling, just the burning. She has parkinson's disease. Pain is intermittent and doesn't seem to get worse with exercise/movement. Has not taken anything, pain seems to be fleeting.   Also has complaints of swollen glands on her left side where she had her lymph node biopsy. She doesn't think it's grown. Has questions about why this is still darkened. It has been red, darkened for months.   Review of Systems  Constitutional: Negative for chills, fatigue and fever.  HENT: Negative for dental problem, ear pain, hearing loss and trouble swallowing.   Eyes: Negative for visual disturbance.  Respiratory: Negative for cough, chest tightness and shortness of breath.   Cardiovascular: Negative for chest pain, palpitations and leg swelling.  Gastrointestinal: Negative for abdominal pain, blood in stool, diarrhea and nausea.  Endocrine: Negative for cold intolerance, polydipsia, polyphagia and polyuria.  Genitourinary: Negative for dysuria and hematuria.   Musculoskeletal: Positive for arthralgias (bilateral feet ).  Skin: Positive for color change. Negative for rash.  Neurological: Negative for dizziness and headaches.  Psychiatric/Behavioral: Negative for dysphoric mood and sleep disturbance. The patient is not nervous/anxious.     Allergies Patient is allergic to hydrocodone-acetaminophen, propoxyphene, and tape.  Past Medical History Patient  has a past medical history of Anemia, Arthritis, Controlled type 1 diabetes mellitus without complication ([place]) (25/36/6440), Hyperlipidemia (06/22/2013), Obesity, Class I, BMI 30-34.9 (06/22/2013), PONV (postoperative nausea and vomiting), Post menopausal syndrome (06/22/2013), Primary hypothyroidism (06/22/2013), and Vitamin D deficiency (09/24/2013).  Surgical History Patient  has a past surgical history that includes Cesarean section (1985); Appendectomy; Tonsillectomy; Breast lumpectomy with radioactive seed localization (Left, 06/10/2018); and Breast excisional biopsy (Left).  Family History Pateint's family history includes CAD (age of onset: 73) in her maternal uncle; Cancer in her paternal grandfather; Coronary artery disease (age of onset: 85) in her maternal uncle; Coronary artery disease (age of onset: 60) in her mother; Heart attack in her maternal grandfather; High Cholesterol in her mother; Hyperlipidemia in her sister; Hypertension in her mother and sister; Hypothyroidism in her sister and sister; Lung cancer in her maternal uncle; Tuberculosis in her father and paternal grandmother.  Social History Patient  reports that she has never smoked. She has never used smokeless tobacco. She reports that she does not drink alcohol and does not use drugs.    Objective: Vitals:   01/28/20 1037  BP: (!) 112/58  Pulse: 75  Temp: Marland Kitchen)  97.1 F (36.2 C)  TempSrc: Temporal  SpO2: 97%  Weight: 185 lb 9.6 oz (84.2 kg)  Height: 5\' 4"  (1.626 m)    Body mass index is 31.86 kg/m.  Physical  Exam Vitals reviewed.  Constitutional:      Appearance: Normal appearance. She is normal weight.  HENT:     Head: Normocephalic and atraumatic.  Cardiovascular:     Rate and Rhythm: Normal rate and regular rhythm.  Pulmonary:     Effort: Pulmonary effort is normal.  Musculoskeletal:        General: Tenderness (bilateral ankles ) present. No swelling or deformity.  Skin:    Comments: Left lateral upper rib cage: hyperpigmented macule.   Neurological:     General: No focal deficit present.     Mental Status: She is alert and oriented to person, place, and time.        Assessment/plan: 1. Bilateral foot pain Pain on top of foot and in ankle that is intermittent. She does have intermittent burning. Advised she discussed with neurologist as they can determine if needs EMG for neuropathy. i'll refer to podiatry as requested as well.  - Ambulatory referral to Podiatry  2. Post-inflammatory hyperpigmentation reassurance given on color of area.   -f/u with surgeon on biopsy of lymph node. No pathological lymph node palpated.    This visit occurred during the SARS-CoV-2 public health emergency.  Safety protocols were in place, including screening questions prior to the visit, additional usage of staff PPE, and extensive cleaning of exam room while observing appropriate contact time as indicated for disinfecting solutions.     Return if symptoms worsen or fail to improve.   Orma Flaming, MD [place]   01/28/2020

## 2020-02-01 ENCOUNTER — Ambulatory Visit: Payer: Self-pay | Admitting: Surgery

## 2020-02-01 DIAGNOSIS — D241 Benign neoplasm of right breast: Secondary | ICD-10-CM

## 2020-02-01 NOTE — H&P (Signed)
Chelsea Mercado Appointment: 02/01/2020 11:30 AM Location: [facility] Patient #: [id number] DOB: 08/24/1967 Separated / Language: [name] / Race: Black or African American Female  History of Present Illness Marcello Moores A. Drenda Sobecki MD; 02/01/2020 1:03 PM) Patient words: Patient returns after neoadjuvant chemotherapy for stage II right breast cancer. She has had a very good response with significant reduction on her examination and magnetic resonance imaging of tumor burden. She feels well and is eager to proceed with surgery. Continue on Herceptin/perjeta . She had significant reduction in size. She is to proceed with breast conserving surgery. Overall she feels well and is recovering well from her chemotherapy at this point.                    60 year old female to evaluate neoadjuvant/treatment response of UPPER INNER RIGHT breast invasive ductal carcinoma.  LABS: None performed today  EXAM: BILATERAL BREAST MRI WITH AND WITHOUT CONTRAST  TECHNIQUE: Multiplanar, multisequence MR images of both breasts were obtained prior to and following the intravenous administration of 6 ml of Gadavist  Three-dimensional MR images were rendered by post-processing of the original MR data on an independent workstation. The three-dimensional MR images were interpreted, and findings are reported in the following complete MRI report for this study. Three dimensional images were evaluated at the independent interpreting workstation using the DynaCAD thin client.  COMPARISON: 09/23/2019 MR. Prior mammograms and ultrasounds.  FINDINGS: Breast composition: c. Heterogeneous fibroglandular tissue.  Background parenchymal enhancement: Mild  Right breast: Biopsy clip artifact within the posterior UPPER INNER RIGHT breast is identified with minimal residual enhancement measuring 1 cm without discernible mass.  Biopsy clip within the UPPER INNER RIGHT breast at the site  of biopsy-proven fibrocystic changes is noted.  No new abnormal areas of enhancement are noted.  Left breast: No mass or abnormal enhancement. Port-A-Cath within the UPPER INNER LEFT breast is noted.  Lymph nodes: No abnormal appearing lymph nodes.  Ancillary findings: None.  IMPRESSION: 1. Treatment response with only 1 cm area of minimal residual enhancement (without discrete mass) at the site of known invasive ductal carcinoma, which measured 3 x 2.2 x 3 cm on 09/23/2019 MR. 2. No new or suspicious areas of enhancement otherwise within either breast. 3. No abnormal appearing lymph nodes.  RECOMMENDATION: Treatment plan  BI-RADS CATEGORY 6: Known biopsy-proven malignancy.   Electronically Signed By: Margarette Canada M.D. On: 01/28/2020 15:05     ADDITIONAL INFORMATION: 1. FLUORESCENCE IN-SITU HYBRIDIZATION Results: GROUP 1: HER2 **POSITIVE** On the tissue sample received from this individual HER2 FISH was performed by a technologist and cell imaging and analysis on the BioView. RATIO OF HER2/CEN 17 SIGNALS 3.44 AVERAGE HER2 COPY NUMBER PER CELL 4.65 The ratio of HER2/CEN 17 result exceeds the cutoff value of >=2.0 and a copy number of HER2 signals exceeding the cutoff range of >=4.0 signals per cell. Arch Pathol Lab Med 1:1,2018 Thressa Sheller MD Pathologist, Electronic Signature ( Signed 09/15/2019) 1. PROGNOSTIC INDICATORS Results: IMMUNOHISTOCHEMICAL AND MORPHOMETRIC ANALYSIS PERFORMED MANUALLY The tumor cells are EQUIVOCAL for Her2 (2+). Her2 by FISH will be performed and results reported separately. Estrogen Receptor: 30%, POSITIVE, WEAK STAINING INTENSITY Progesterone Receptor: 0%, NEGATIVE Proliferation Marker Ki67: 50% COMMENT: The negative hormone receptor study(ies) in this case has an internal positive control. REFERENCE RANGE ESTROGEN RECEPTOR 1 of 3 FINAL for Chelsea Mercado ([id number]) ADDITIONAL INFORMATION:(continued) NEGATIVE 0% POSITIVE  =>1% REFERENCE RANGE PROGESTERONE RECEPTOR NEGATIVE 0% POSITIVE =>1% All controls stained appropriately Thressa Sheller MD Pathologist,  Electronic Signature ( Signed 09/09/2019) FINAL DIAGNOSIS Diagnosis 1. Breast, right, needle core biopsy, 12:30 o'clock, 5cmfn - INVASIVE DUCTAL CARCINOMA. SEE NOTE 2. Breast, right, needle core biopsy, 12:30 o'clock, 1cmfn - FIBROCYSTIC CHANGE - NEGATIVE FOR CARCINOMA 3. Lymph node, needle/core biopsy, right axilla - BENIGN LYMPH NODE Diagnosis Note 1. Carcinoma measures 1.5 cm in greatest linear dimension and appears grade 3. Dr. Tresa Moore reviewed the case and concurs with the diagnosis. A breast prognostic profile (ER, PR, Ki-67 and HER2) is pending and will be reported in an addendum. The [facility] [place] was notified on 09/08/2019. Jaquita Folds MD Pathologist, Electronic Signature (Case signed 09/08/2019).  The patient is a 60 year old female.   Problem List/Past Medical Sharyn Lull R. Brooks, CMA; 02/01/2020 11:35 AM) BREAST CANCER, STAGE 2, RIGHT (C50.911)  Past Surgical History Sharyn Lull R. Brooks, CMA; 02/01/2020 11:35 AM) Breast Biopsy Bilateral. Colon Polyp Removal - Colonoscopy Sentinel Lymph Node Biopsy Ventral / Umbilical Hernia Surgery Bilateral.  Diagnostic Studies History Sharyn Lull R. Rolena Infante, CMA; 02/01/2020 11:35 AM) Colonoscopy 1-5 years ago Mammogram within last year Pap Smear 1-5 years ago  Allergies Sharyn Lull R. Brooks, CMA; 02/01/2020 11:35 AM) Clarithromycin *CHEMICALS* Swelling.  Medication History Sharyn Lull R. Brooks, CMA; 02/01/2020 11:35 AM) Albuterol (90MCG/ACT Aerosol Soln, Inhalation) Active. Biotin (5000MCG Tablet, Oral) Active. Cholecalciferol (50 MCG(2000 UT) Tablet Chewable, Oral) Active. CeleXA (20MG Tablet, Oral) Active. Mitigare (0.6MG Capsule, Oral) Active. Multiple Vitamin (1 (one) Oral) Active. Medications Reconciled  Social History Sharyn Lull R. Brooks, CMA;  02/01/2020 11:35 AM) Alcohol use Occasional alcohol use. Caffeine use Carbonated beverages, Coffee. No drug use Tobacco use Current every day smoker.  Family History Sharyn Lull R. Rolena Infante, CMA; 02/01/2020 11:35 AM) Alcohol Abuse Father, Mother. Arthritis Family Members In General. Bleeding disorder Family Members In General. Cancer Son. Colon Cancer Family Members In General. Colon Polyps Family Members In General. Diabetes Mellitus Mother. Hypertension Mother. Ischemic Bowel Disease Mother. Kidney Disease Family Members In General, Mother.  Pregnancy / Birth History Sharyn Lull R. Rolena Infante, CMA; 02/01/2020 11:35 AM) Age at menarche 41 years. Age of menopause 33-50 Contraceptive History Depo-provera, Oral contraceptives. Gravida 3 Maternal age 95-20 Para 3 Regular periods  Other Problems Sharyn Lull R. Brooks, CMA; 02/01/2020 11:35 AM) Depression Myocardial infarction     Physical Exam (Danyetta Gillham A. Alando Colleran MD; 02/01/2020 1:04 PM)  General Note: Alopecia noted  Chest and Lung Exam Chest and lung exam reveals -quiet, even and easy respiratory effort with no use of accessory muscles and on auscultation, normal breath sounds, no adventitious sounds and normal vocal resonance. Inspection Chest Wall - Normal. Back - normal.  Breast Breast - Left-Symmetric, Non Tender, No Biopsy scars, no Dimpling - Left, No Inflammation, No Lumpectomy scars, No Mastectomy scars, No Peau d' Orange. Breast - Right-Symmetric, Non Tender, No Biopsy scars, no Dimpling - Right, No Inflammation, No Lumpectomy scars, No Mastectomy scars, No Peau d' Orange. Breast Lump-No Palpable Breast Mass.  Neurologic Neurologic evaluation reveals -alert and oriented x 3 with no impairment of recent or remote memory. Mental Status-Normal.  Musculoskeletal Normal Exam - Left-Upper Extremity Strength Normal and Lower Extremity Strength Normal. Normal Exam - Right-Upper Extremity  Strength Normal and Lower Extremity Strength Normal.  Lymphatic Head & Neck  General Head & Neck Lymphatics: Bilateral - Description - Normal. Axillary  General Axillary Region: Bilateral - Description - Normal. Tenderness - Non Tender.    Assessment & Plan (Markell Schrier A. Hayslee Casebolt MD; 02/01/2020 1:05 PM)  BREAST CANCER, STAGE 2, RIGHT (C50.911) Impression: Patient has  had a good response to chemotherapy. She is ready to proceed with breast conserving surgery after discussing all for surgical options for breast conserving surgery to mastectomy with reconstruction. We discussed lymphedema risk with sentinel lymph node mapping. She will be scheduled for a right breast seed localized lumpectomy with right axillary sentinel lymph node mapping. Risk of lumpectomy include bleeding, infection, seroma, more surgery, use of seed/wire, wound care, cosmetic deformity and the need for other treatments, death , blood clots, death. Pt agrees to proceed. Risk of sentinel lymph node mapping include bleeding, infection, lymphedema, shoulder pain. stiffness, dye allergy. cosmetic deformity , blood clots, death, need for more surgery. Pt agrees to proceed.  Current Plans You are being scheduled for surgery- Our schedulers will call you.  You should hear from our office's scheduling department within 5 working days about the location, date, and time of surgery. We try to make accommodations for patient's preferences in scheduling surgery, but sometimes the OR schedule or the surgeon's schedule prevents [place] from making those accommodations.  If you have not heard from our office [phone]) in 5 working days, call the office and ask for your surgeon's nurse.  If you have other questions about your diagnosis, plan, or surgery, call the office and ask for your surgeon's nurse.  Pt Education - [facility] Breast Cancer Information Given - [facility] "Breast Journey" Package We discussed the staging and pathophysiology of  breast cancer. We discussed all of the different options for treatment for breast cancer including surgery, chemotherapy, radiation therapy, Herceptin, and antiestrogen therapy. We discussed a sentinel lymph node biopsy as she does not appear to having lymph node involvement right now. We discussed the performance of that with injection of radioactive tracer and blue dye. We discussed that she would have an incision underneath her axillary hairline. We discussed that there is a bout a 10-20% chance of having a positive node with a sentinel lymph node biopsy and we will await the permanent pathology to make any other first further decisions in terms of her treatment. One of these options might be to return to the operating room to perform an axillary lymph node dissection. We discussed about a 1-2% risk lifetime of chronic shoulder pain as well as lymphedema associated with a sentinel lymph node biopsy. We discussed the options for treatment of the breast cancer which included lumpectomy versus a mastectomy. We discussed the performance of the lumpectomy with a wire placement. We discussed a 10-20% chance of a positive margin requiring reexcision in the operating room. We also discussed that she may need radiation therapy or antiestrogen therapy or both if she undergoes lumpectomy. We discussed the mastectomy and the postoperative care for that as well. We discussed that there is no difference in her survival whether she undergoes lumpectomy with radiation therapy or antiestrogen therapy versus a mastectomy. There is a slight difference in the local recurrence rate being 3-5% with lumpectomy and about 1% with a mastectomy. We discussed the risks of operation including bleeding, infection, possible reoperation. She understands her further therapy will be based on what her stages at the time of her operation.  Pt Education - [facility] breast cancer surgery: discussed with patient and provided information.

## 2020-02-01 NOTE — H&P (Signed)
Chelsea Mercado Appointment: 02/01/2020 10:10 AM Location: [facility] Patient #: [id number] DOB: 05-Nov-1960 Married / Language: Chelsea Mercado / Race: White Female  History of Present Illness Chelsea Moores A. Lealer Marsland MD; 02/01/2020 12:57 PM) Patient words: Patient presents for evaluation of abnormal right breast mammogram. Area/ density discovered on recent mammogram right upper central into the inner quadrant which underwent core biopsy proven to be consistent with papilloma. History of left breast papilloma in 2020 status post lumpectomy. No family history of breast cancer. Patient recently diagnosed with Parkinson's which is mild. She is also undergoing evaluation of her feet by a podiatrist due to numbness and pain. No history of breast masses, nipple discharge or change in either breast.                  Patient returns today to evaluate a possible RIGHT breast mass with calcifications and possible axillary adenopathy identified on recent screening mammogram.  EXAM: DIGITAL DIAGNOSTIC RIGHT MAMMOGRAM WITH CAD AND TOMO  ULTRASOUND RIGHT BREAST  ULTRASOUND LEFT AXILLA  COMPARISON: Previous exams including recent screening mammogram dated 10/26/2019.  ACR Breast Density Category c: The breast tissue is heterogeneously dense, which may obscure small masses.  FINDINGS: Grouped coarse, dystrophic and punctate calcifications are confirmed within the upper subareolar RIGHT breast, spanning approximately 9 mm, with underlying mass.  Mammographic images were processed with CAD.  RIGHT breast: Targeted ultrasound is performed, showing an irregular hypoechoic mass with associated calcifications in the RIGHT breast at the 12 o'clock axis, 1 cm from the nipple, measuring 1.2 x 0.9 x 1 cm, corresponding to the mammographic findings.  LEFT axilla: Targeted ultrasound is performed, showing 3 morphologically abnormal lymph nodes, with abnormal  cortical thickness.  IMPRESSION: 1. Irregular mass with associated calcifications in the RIGHT breast at the 12 o'clock axis, 1 cm from the nipple, measuring 1.2 cm, corresponding to the mammographic findings. This may represent a papillary lesion. Ultrasound-guided biopsy is recommended. 2. 3 morphologically abnormal lymph nodes within the LEFT axilla. Ultrasound-guided biopsy is recommended for 1 of these lymph nodes.  RECOMMENDATION: 1. Ultrasound-guided biopsy of the RIGHT breast mass at the 12 o'clock axis, 1 cm from the nipple, measuring 1.2 cm. 2. Ultrasound-guided biopsy of 1 of the morphologically abnormal lymph nodes in the LEFT axilla.  Ultrasound-guided biopsies are scheduled for November 19th.  I have discussed the findings and recommendations with the patient. If applicable, a reminder letter will be sent to the patient regarding the next appointment.  BI-RADS CATEGORY 4: Suspicious.   Electronically Signed By: Chelsea Mercado M.D. On: 11/13/2019 12:19.  The patient is a 60 year old female.   Medication History (Chelsea Mercado, CMA; 02/01/2020 10:34 AM) Vitamin D (1000UNIT Tablet, Oral) Active. Contour Next Test (In Vitro) Active. HumaLOG (100UNIT/ML Solution, Subcutaneous) Active. Levothyroxine Sodium (125MCG Tablet, Oral) Active. Pravastatin Sodium (40MG  Tablet, Oral) Active. Ramipril (10MG  Capsule, Oral) Active. Medications Reconciled     Physical Exam (Chelsea Surprenant A. Everlee Quakenbush MD; 02/01/2020 12:57 PM)  General Mental Status-Alert. General Appearance-Consistent with stated age. Hydration-Well hydrated. Voice-Normal.  Head and Neck Head-normocephalic, atraumatic with no lesions or palpable masses. Trachea-midline. Thyroid Gland Characteristics - normal size and consistency.  Eye Eyeball - Bilateral-Extraocular movements intact. Sclera/Conjunctiva - Bilateral-No scleral icterus.  Breast Breast - Left-Symmetric, Non  Tender, No Biopsy scars, no Dimpling - Left, No Inflammation, No Lumpectomy scars, No Mastectomy scars, No Peau d' Orange. Breast - Right-Symmetric, Non Tender, No Biopsy scars, no Dimpling - Right, No Inflammation, No Lumpectomy scars, No  Mastectomy scars, No Peau d' Orange. Breast Lump-No Palpable Breast Mass.  Neurologic Note: Mild tremor at rest. No colic we'll rigidity.  Musculoskeletal Normal Exam - Left-Upper Extremity Strength Normal and Lower Extremity Strength Normal. Normal Exam - Right-Upper Extremity Strength Normal and Lower Extremity Strength Normal.  Lymphatic Head & Neck  General Head & Neck Lymphatics: Bilateral - Description - Normal. Axillary  General Axillary Region: Bilateral - Description - Normal. Tenderness - Non Tender.    Assessment & Plan (Chelsea Streiff A. Rahma Meller MD; 02/01/2020 12:59 PM)  PAPILLOMA OF RIGHT BREAST (D24.1) Impression: Recommend right breast lumpectomy. She is undergoing evaluation of foot pain by her podiatrist and this may be early onset of neuropathy secondary to her long-standing diabetes. I told her we can delay surgery to later on. The spring once her evaluation is done. She may also need to see a vascular surgeon. Plan on lumpectomy in the spring once her schedule permits. Discussed right breast seed localized lumpectomy. Risk of lumpectomy include bleeding, infection, seroma, more surgery, use of seed/wire, wound care, cosmetic deformity and the need for other treatments, death , blood clots, death. Pt agrees to proceed.  Current Plans Pt Education - [facility] Breast Biopsy HCI: discussed with patient and provided information.

## 2020-02-09 ENCOUNTER — Other Ambulatory Visit: Payer: Self-pay | Admitting: Surgery

## 2020-02-09 DIAGNOSIS — D241 Benign neoplasm of right breast: Secondary | ICD-10-CM

## 2020-02-10 ENCOUNTER — Ambulatory Visit (INDEPENDENT_AMBULATORY_CARE_PROVIDER_SITE_OTHER): Payer: 59

## 2020-02-10 ENCOUNTER — Other Ambulatory Visit: Payer: Self-pay

## 2020-02-10 ENCOUNTER — Ambulatory Visit: Payer: 59 | Admitting: Podiatry

## 2020-02-10 DIAGNOSIS — M79671 Pain in right foot: Secondary | ICD-10-CM | POA: Diagnosis not present

## 2020-02-10 DIAGNOSIS — G629 Polyneuropathy, unspecified: Secondary | ICD-10-CM

## 2020-02-10 DIAGNOSIS — E0843 Diabetes mellitus due to underlying condition with diabetic autonomic (poly)neuropathy: Secondary | ICD-10-CM

## 2020-02-10 DIAGNOSIS — M79672 Pain in left foot: Secondary | ICD-10-CM

## 2020-02-10 DIAGNOSIS — M19079 Primary osteoarthritis, unspecified ankle and foot: Secondary | ICD-10-CM | POA: Diagnosis not present

## 2020-02-10 MED ORDER — GABAPENTIN 100 MG PO CAPS: 100.0000 mg | ORAL_CAPSULE | Freq: Three times a day (TID) | ORAL | 3 refills | Status: DC

## 2020-02-10 NOTE — Progress Notes (Signed)
   HPI: 60 y.o. female presenting today for evaluation of bilateral foot pain that has been going on for several years now.  Patient requested a referral from her PCP.  Patient states that she gets regional pain that changes location on a daily basis.  She also experiences numbness and tingling with burning sensations of bilateral feet.  She is a type I diabetic.  She denies a history of injury to her feet she presents for further treatment evaluation  Past Medical History:  Diagnosis Date  . Anemia   . Arthritis   . Controlled type 1 diabetes mellitus without complication ([place]) 78/24/2353  . Hyperlipidemia 06/22/2013  . Obesity, Class I, BMI 30-34.9 06/22/2013  . PONV (postoperative nausea and vomiting)   . Post menopausal syndrome 06/22/2013  . Primary hypothyroidism 06/22/2013  . Vitamin D deficiency 09/24/2013     Physical Exam: General: The patient is alert and oriented x3 in no acute distress.  Dermatology: Skin is warm, dry and supple bilateral lower extremities. Negative for open lesions or macerations.  Vascular: Palpable pedal pulses bilaterally. No edema or erythema noted. Capillary refill within normal limits.  Neurological: Epicritic and protective threshold diminished bilaterally.  The patient describes numbness tingling burning to the bilateral feet  Musculoskeletal Exam: Range of motion within normal limits to all pedal and ankle joints bilateral. Muscle strength 5/5 in all groups bilateral.  There was some significant pain with direct palpation of the Lisfranc and midtarsal joints bilateral feet  Radiographic Exam:  Normal osseous mineralization. Joint spaces preserved. No fracture/dislocation/boney destruction.  There may be some degenerative changes noted throughout the midtarsal joints of bilateral feet.  Assessment: 1. T1DM  2.  Peripheral polyneuropathy 3.  DJD bilateral midtarsal joints   Plan of Care:  1. Patient evaluated. X-Rays reviewed.  2.   Prescription for gabapentin 100 mg 3 times daily 3.  Recommend good supportive sneakers with arch supports.  Patient states that she has been going to go to [facility].  Patient is very active and walks approximately 45 minutes/day or does the elliptical 4.  Return to clinic in 2 months for follow-up      Edrick Kins, DPM [facility]  Dr. Edrick Kins, DPM    [address], [place] [address]                Office [phone]  Fax [phone]

## 2020-02-12 ENCOUNTER — Encounter: Payer: Self-pay | Admitting: Internal Medicine

## 2020-02-12 ENCOUNTER — Other Ambulatory Visit: Payer: Self-pay | Admitting: Internal Medicine

## 2020-02-12 MED ORDER — ACCU-CHEK GUIDE W/DEVICE KIT: PACK | 0 refills | Status: AC

## 2020-03-16 NOTE — Progress Notes (Signed)
Assessment/Plan:   1.  Parkinsons Disease  -Discussed with the patient data to suggest that all patients, even fairly newly diagnosed ones should be on levodopa.  We discussed extensively risk, benefits, and side effects, including but not limited to dyskinesia.  She was agreeable.  We will work-up carbidopa/levodopa 25/100, 1 tablet at 7 AM/11 M/4 PM.  -Congratulated her with the exercise.  -Patient planning to have breast lump removed.  Discussed surgical indications as relates to Parkinson's disease.   Subjective:   Chelsea Mercado was seen today in follow up for Parkinsons disease.  My previous records were reviewed prior to todays visit as well as outside records available to me. Pt with L foot tremor.  Pt denies falls.  Pt denies lightheadedness, near syncope.  No hallucinations.  Mood has been good.  "I feel great."  She is exercising - doing fit bit and doing walking.  Has a total gym and uses that as well.  Has seen Dr. Amalia Hailey of podiatry for diabetic peripheral neuropathy.  He started her on gabapentin.  She didn't start it as sx's got better and she didn't want potential SE of gabapentin.  Current prescribed movement disorder medications: none   ALLERGIES:   Allergies  Allergen Reactions  . Hydrocodone-Acetaminophen Nausea And Vomiting  . Propoxyphene Nausea Only  . Tape Rash    CURRENT MEDICATIONS:  Outpatient Encounter Medications as of 03/18/2020  Medication Sig  . azelastine (ASTELIN) 0.1 % nasal spray Place 2 sprays into both nostrils 2 (two) times daily. Use in each nostril as directed  . Blood Glucose Monitoring Suppl (ACCU-CHEK GUIDE) w/Device KIT AccuChek Guide LINK - the only meter approved to communicate with her Medtronic 770G insulin pump  . Cholecalciferol (VITAMIN D3) 5000 units CAPS Take 1 capsule by mouth daily.   . Glucagon 3 MG/DOSE POWD Place 3 mg into the nose once as needed for up to 1 dose.  . ibuprofen (ADVIL) 800 MG tablet Take 1 tablet (800 mg  total) by mouth every 8 (eight) hours as needed.  . insulin lispro (HUMALOG) 100 UNIT/ML injection USE AS DIRECTED PER PUMP TAKE MAXIMUM 50 UNITS DAILY  . Insulin Pen Needle (PEN NEEDLES) 32G X 4 MM MISC 1 each by Does not apply route daily.  . Insulin Syringe-Needle U-100 (INSULIN SYRINGE .3CC/31GX5/16") 31G X 5/16" 0.3 ML MISC Use to inject insulin if needed for back up of pump.  Marland Kitchen levothyroxine (SYNTHROID) 125 MCG tablet TAKE 1 TABLET BY MOUTH EVERY DAY  . naproxen (NAPROSYN) 500 MG tablet Take 1 tablet (500 mg total) by mouth 2 (two) times daily with a meal. (Patient taking differently: Take 500 mg by mouth 2 (two) times daily with a meal. prn)  . pravastatin (PRAVACHOL) 40 MG tablet TAKE 1 TABLET BY MOUTH EVERY DAY  . ramipril (ALTACE) 10 MG capsule TAKE 1 CAPSULE BY MOUTH EVERY DAY  . gabapentin (NEURONTIN) 100 MG capsule Take 1 capsule (100 mg total) by mouth 3 (three) times daily. (Patient not taking: Reported on 03/18/2020)  . vitamin B-12 (CYANOCOBALAMIN) 500 MCG tablet Take 1,000 mcg by mouth daily.   No facility-administered encounter medications on file as of 03/18/2020.    Objective:   PHYSICAL EXAMINATION:    VITALS:   Vitals:   03/18/20 1031  BP: 138/72  Pulse: 78  SpO2: 98%  Weight: 187 lb (84.8 kg)  Height: 5' 4"  (1.626 m)    GEN:  The patient appears stated age and is in NAD.  HEENT:  Normocephalic, atraumatic.  The mucous membranes are moist. The superficial temporal arteries are without ropiness or tenderness. CV:  RRR Lungs:  CTAB Neck/HEME:  There are no carotid bruits bilaterally.  Neurological examination:  Orientation: The patient is alert and oriented x3. Cranial nerves: There is good facial symmetry with mild facial hypomimia. The speech is fluent and clear. Soft palate rises symmetrically and there is no tongue deviation. Hearing is intact to conversational tone. Sensation: Sensation is intact to light touch throughout Motor: Strength is at least  antigravity x4.  Movement examination:  Tone: There is mild increased tone in the LUE/LLE Abnormal movements: there is LUE rest tremor and mild LLE rest tremor Coordination:  There is mild decremation with RAM's, with finger taps on the L and toe taps on the L Gait and Station: The patient has no difficulty arising out of a deep-seated chair without the use of the hands. The patient's stride length is good.  The patient has a positive pull test.     I have reviewed and interpreted the following labs independently    Chemistry      Component Value Date/Time   NA 138 09/10/2019 1123   K 4.9 09/10/2019 1123   CL 102 09/10/2019 1123   CO2 27 09/10/2019 1123   BUN 19 09/10/2019 1123   CREATININE 0.89 09/10/2019 1123      Component Value Date/Time   CALCIUM 9.6 09/10/2019 1123   ALKPHOS 64 11/05/2018 1131   AST 18 09/10/2019 1123   ALT 13 09/10/2019 1123   BILITOT 0.5 09/10/2019 1123       Lab Results  Component Value Date   WBC 7.8 11/19/2019   HGB 13.9 11/19/2019   HCT 41.3 11/19/2019   MCV 90.4 11/19/2019   PLT 311 11/19/2019    Lab Results  Component Value Date   TSH 0.80 09/10/2019     Total time spent on today's visit was 30 minutes, including both face-to-face time and nonface-to-face time.  Time included that spent on review of records (prior notes available to me/labs/imaging if pertinent), discussing treatment and goals, answering patient's questions and coordinating care.  Cc:  Orma Flaming, MD

## 2020-03-18 ENCOUNTER — Other Ambulatory Visit: Payer: Self-pay

## 2020-03-18 ENCOUNTER — Encounter: Payer: Self-pay | Admitting: Neurology

## 2020-03-18 ENCOUNTER — Ambulatory Visit: Payer: 59 | Admitting: Neurology

## 2020-03-18 VITALS — BP 138/72 | HR 78 | Ht 64.0 in | Wt 187.0 lb

## 2020-03-18 DIAGNOSIS — G20 Parkinson's disease: Secondary | ICD-10-CM | POA: Diagnosis not present

## 2020-03-18 MED ORDER — CARBIDOPA-LEVODOPA 25-100 MG PO TABS: 1.0000 | ORAL_TABLET | Freq: Three times a day (TID) | ORAL | 1 refills | Status: DC

## 2020-03-18 NOTE — Patient Instructions (Signed)
Start Carbidopa Levodopa as follows:  Take 1/2 tablet three times daily, at least 30 minutes before meals (approximately 7am/11am/4pm), for one week  Then take 1/2 tablet in the morning, 1/2 tablet in the afternoon, 1 tablet in the evening, at least 30 minutes before meals, for one week  Then take 1/2 tablet in the morning, 1 tablet in the afternoon, 1 tablet in the evening, at least 30 minutes before meals, for one week  Then take 1 tablet three times daily at 7am/11am/4pm, at least 30 minutes before meals   As a reminder, carbidopa/levodopa can be taken at the same time as a carbohydrate, but we like to have you take your pill either 30 minutes before a protein source or 1 hour after as protein can interfere with carbidopa/levodopa absorption.  The physicians and staff at [facility] are committed to providing excellent care. You may receive a survey requesting feedback about your experience at our office. We strive to receive "very good" responses to the survey questions. If you feel that your experience would prevent you from giving the office a "very good " response, please contact our office to try to remedy the situation. We may be reached at [phone]. Thank you for taking the time out of your busy day to complete the survey.  

## 2020-04-13 ENCOUNTER — Ambulatory Visit: Payer: 59 | Admitting: Podiatry

## 2020-04-14 ENCOUNTER — Encounter (HOSPITAL_BASED_OUTPATIENT_CLINIC_OR_DEPARTMENT_OTHER): Payer: Self-pay | Admitting: Surgery

## 2020-04-14 ENCOUNTER — Other Ambulatory Visit: Payer: Self-pay

## 2020-04-18 ENCOUNTER — Other Ambulatory Visit (HOSPITAL_COMMUNITY)
Admission: RE | Admit: 2020-04-18 | Discharge: 2020-04-18 | Disposition: A | Discharge status: Home / Self Care | Payer: 59 | Source: Ambulatory Visit | Attending: Surgery | Admitting: Surgery

## 2020-04-18 ENCOUNTER — Encounter (HOSPITAL_BASED_OUTPATIENT_CLINIC_OR_DEPARTMENT_OTHER)
Admission: RE | Admit: 2020-04-18 | Discharge: 2020-04-18 | Disposition: A | Discharge status: Home / Self Care | Payer: 59 | Source: Ambulatory Visit | Attending: Surgery | Admitting: Surgery

## 2020-04-18 DIAGNOSIS — Z20822 Contact with and (suspected) exposure to covid-19: Secondary | ICD-10-CM | POA: Insufficient documentation

## 2020-04-18 DIAGNOSIS — Z01812 Encounter for preprocedural laboratory examination: Secondary | ICD-10-CM | POA: Insufficient documentation

## 2020-04-18 LAB — BASIC METABOLIC PANEL
Anion gap: 7 (ref 5–15)
BUN: 13 mg/dL (ref 6–20)
CO2: 29 mmol/L (ref 22–32)
Calcium: 9.1 mg/dL (ref 8.9–10.3)
Chloride: 101 mmol/L (ref 98–111)
Creatinine, Ser: 0.9 mg/dL (ref 0.44–1.00)
GFR, Estimated: >60 mL/min (ref >60)
Glucose, Bld: 173 mg/dL — ABNORMAL HIGH (ref 70–99)
Potassium: 4.8 mmol/L (ref 3.5–5.1)
Sodium: 137 mmol/L (ref 135–145)

## 2020-04-18 LAB — SARS CORONAVIRUS 2 (TAT 6-24 HRS): SARS Coronavirus 2: NEGATIVE

## 2020-04-18 NOTE — Progress Notes (Signed)

## 2020-04-19 ENCOUNTER — Telehealth: Payer: Self-pay | Admitting: Internal Medicine

## 2020-04-19 NOTE — Telephone Encounter (Signed)
No, she can continue with the same pump settings and only bolus for blood sugar correction while n.p.o. Resume bolusing for meals when she is able to eat after surgery.

## 2020-04-19 NOTE — Telephone Encounter (Signed)
Day Surgery on 14 th and wants to know if she should adjust her insulin etc.  Call back number [phone]

## 2020-04-19 NOTE — Telephone Encounter (Signed)
Pt verbalized understanding.

## 2020-04-19 NOTE — Telephone Encounter (Signed)
Please advise 

## 2020-04-20 ENCOUNTER — Other Ambulatory Visit: Payer: Self-pay

## 2020-04-20 ENCOUNTER — Ambulatory Visit
Admission: RE | Admit: 2020-04-20 | Discharge: 2020-04-20 | Disposition: A | Discharge status: Home / Self Care | Payer: 59 | Source: Ambulatory Visit | Attending: Surgery | Admitting: Surgery

## 2020-04-20 DIAGNOSIS — D241 Benign neoplasm of right breast: Secondary | ICD-10-CM

## 2020-04-21 ENCOUNTER — Other Ambulatory Visit: Payer: Self-pay

## 2020-04-21 ENCOUNTER — Ambulatory Visit
Admission: RE | Admit: 2020-04-21 | Discharge: 2020-04-21 | Disposition: A | Discharge status: Home / Self Care | Payer: 59 | Source: Ambulatory Visit | Attending: Surgery | Admitting: Surgery

## 2020-04-21 ENCOUNTER — Encounter (HOSPITAL_BASED_OUTPATIENT_CLINIC_OR_DEPARTMENT_OTHER): Payer: Self-pay | Admitting: Surgery

## 2020-04-21 ENCOUNTER — Encounter (HOSPITAL_BASED_OUTPATIENT_CLINIC_OR_DEPARTMENT_OTHER)
Admission: RE | Disposition: A | Discharge status: Home / Self Care | Payer: Self-pay | Source: Ambulatory Visit | Attending: Surgery

## 2020-04-21 ENCOUNTER — Ambulatory Visit (HOSPITAL_BASED_OUTPATIENT_CLINIC_OR_DEPARTMENT_OTHER): Payer: 59 | Admitting: Certified Registered Nurse Anesthetist

## 2020-04-21 ENCOUNTER — Ambulatory Visit (HOSPITAL_BASED_OUTPATIENT_CLINIC_OR_DEPARTMENT_OTHER)
Admission: RE | Admit: 2020-04-21 | Discharge: 2020-04-21 | Disposition: A | Discharge status: Home / Self Care | Payer: 59 | Source: Ambulatory Visit | Attending: Surgery | Admitting: Surgery

## 2020-04-21 DIAGNOSIS — D241 Benign neoplasm of right breast: Secondary | ICD-10-CM

## 2020-04-21 HISTORY — DX: Parkinson's disease without dyskinesia, without mention of fluctuations: G20.A1

## 2020-04-21 HISTORY — PX: BREAST LUMPECTOMY WITH RADIOACTIVE SEED LOCALIZATION: SHX6424

## 2020-04-21 HISTORY — DX: Parkinson's disease: G20

## 2020-04-21 HISTORY — PX: BREAST EXCISIONAL BIOPSY: SUR124

## 2020-04-21 LAB — GLUCOSE, CAPILLARY
Glucose-Capillary: 166 mg/dL — ABNORMAL HIGH (ref 70–99)
Glucose-Capillary: 207 mg/dL — ABNORMAL HIGH (ref 70–99)

## 2020-04-21 SURGERY — BREAST LUMPECTOMY WITH RADIOACTIVE SEED LOCALIZATION
Anesthesia: General | Site: Breast | Laterality: Right

## 2020-04-21 MED ORDER — PROPOFOL 10 MG/ML IV BOLUS
INTRAVENOUS | Status: DC | PRN
  Administered 2020-04-21: 200 mg via INTRAVENOUS

## 2020-04-21 MED ORDER — DROPERIDOL 2.5 MG/ML IJ SOLN
INTRAMUSCULAR | Status: AC
  Filled 2020-04-21: qty 2

## 2020-04-21 MED ORDER — CHLORHEXIDINE GLUCONATE CLOTH 2 % EX PADS: 6.0000 | MEDICATED_PAD | Freq: Once | CUTANEOUS | Status: DC

## 2020-04-21 MED ORDER — OXYCODONE HCL 5 MG PO TABS: 5.0000 mg | ORAL_TABLET | Freq: Once | ORAL | Status: DC | PRN

## 2020-04-21 MED ORDER — CELECOXIB 200 MG PO CAPS
ORAL_CAPSULE | ORAL | Status: AC
  Filled 2020-04-21: qty 1

## 2020-04-21 MED ORDER — MEPERIDINE HCL 25 MG/ML IJ SOLN: 6.2500 mg | INTRAMUSCULAR | Status: DC | PRN

## 2020-04-21 MED ORDER — ONDANSETRON HCL 4 MG/2ML IJ SOLN
INTRAMUSCULAR | Status: AC
  Filled 2020-04-21: qty 2

## 2020-04-21 MED ORDER — GABAPENTIN 300 MG PO CAPS
ORAL_CAPSULE | ORAL | Status: AC
  Filled 2020-04-21: qty 1

## 2020-04-21 MED ORDER — OXYCODONE HCL 5 MG/5ML PO SOLN
5.0000 mg | Freq: Once | ORAL | Status: DC | PRN
Start: 2020-04-21 — End: 2020-04-21

## 2020-04-21 MED ORDER — SODIUM CHLORIDE 0.9 % IV SOLN
INTRAVENOUS | Status: AC
  Filled 2020-04-21 (×2): qty 10

## 2020-04-21 MED ORDER — IBUPROFEN 800 MG PO TABS
800.0000 mg | ORAL_TABLET | Freq: Three times a day (TID) | ORAL | 0 refills | Status: DC | PRN
Start: 2020-04-21 — End: 2020-11-21

## 2020-04-21 MED ORDER — DEXAMETHASONE SODIUM PHOSPHATE 10 MG/ML IJ SOLN
INTRAMUSCULAR | Status: AC
  Filled 2020-04-21: qty 1

## 2020-04-21 MED ORDER — FENTANYL CITRATE (PF) 100 MCG/2ML IJ SOLN
INTRAMUSCULAR | Status: DC | PRN
  Administered 2020-04-21 (×2): 50 ug via INTRAVENOUS

## 2020-04-21 MED ORDER — CELECOXIB 200 MG PO CAPS
200.0000 mg | ORAL_CAPSULE | ORAL | Status: AC
  Administered 2020-04-21: 200 mg via ORAL

## 2020-04-21 MED ORDER — HYDROMORPHONE HCL 1 MG/ML IJ SOLN: 0.2500 mg | INTRAMUSCULAR | Status: DC | PRN

## 2020-04-21 MED ORDER — LACTATED RINGERS IV SOLN: INTRAVENOUS | Status: DC

## 2020-04-21 MED ORDER — CEFAZOLIN SODIUM-DEXTROSE 2-4 GM/100ML-% IV SOLN
2.0000 g | INTRAVENOUS | Status: AC
  Administered 2020-04-21: 2 g via INTRAVENOUS

## 2020-04-21 MED ORDER — PHENYLEPHRINE 40 MCG/ML (10ML) SYRINGE FOR IV PUSH (FOR BLOOD PRESSURE SUPPORT)
PREFILLED_SYRINGE | INTRAVENOUS | Status: DC | PRN
  Administered 2020-04-21: 120 ug via INTRAVENOUS
  Administered 2020-04-21: 200 ug via INTRAVENOUS

## 2020-04-21 MED ORDER — GABAPENTIN 300 MG PO CAPS
300.0000 mg | ORAL_CAPSULE | ORAL | Status: AC
  Administered 2020-04-21: 300 mg via ORAL

## 2020-04-21 MED ORDER — CEFAZOLIN SODIUM-DEXTROSE 2-4 GM/100ML-% IV SOLN
INTRAVENOUS | Status: AC
  Filled 2020-04-21: qty 100

## 2020-04-21 MED ORDER — DROPERIDOL 2.5 MG/ML IJ SOLN
INTRAMUSCULAR | Status: DC | PRN
  Administered 2020-04-21: .625 mg via INTRAVENOUS

## 2020-04-21 MED ORDER — TRAMADOL HCL 50 MG PO TABS: 50.0000 mg | ORAL_TABLET | Freq: Four times a day (QID) | ORAL | 0 refills | Status: DC | PRN

## 2020-04-21 MED ORDER — MIDAZOLAM HCL 5 MG/5ML IJ SOLN
INTRAMUSCULAR | Status: DC | PRN
  Administered 2020-04-21: 2 mg via INTRAVENOUS

## 2020-04-21 MED ORDER — LIDOCAINE 2% (20 MG/ML) 5 ML SYRINGE
INTRAMUSCULAR | Status: DC | PRN
  Administered 2020-04-21: 80 mg via INTRAVENOUS

## 2020-04-21 MED ORDER — BUPIVACAINE-EPINEPHRINE (PF) 0.25% -1:200000 IJ SOLN
INTRAMUSCULAR | Status: DC | PRN
  Administered 2020-04-21: 25 mL

## 2020-04-21 MED ORDER — ONDANSETRON HCL 4 MG/2ML IJ SOLN
INTRAMUSCULAR | Status: DC | PRN
  Administered 2020-04-21: 4 mg via INTRAVENOUS

## 2020-04-21 MED ORDER — MIDAZOLAM HCL 2 MG/2ML IJ SOLN
INTRAMUSCULAR | Status: AC
  Filled 2020-04-21: qty 2

## 2020-04-21 MED ORDER — SODIUM CHLORIDE 0.9 % IV SOLN
INTRAVENOUS | Status: DC | PRN
  Administered 2020-04-21: 500 mL

## 2020-04-21 MED ORDER — FENTANYL CITRATE (PF) 100 MCG/2ML IJ SOLN
INTRAMUSCULAR | Status: AC
  Filled 2020-04-21: qty 2

## 2020-04-21 MED ORDER — LIDOCAINE 2% (20 MG/ML) 5 ML SYRINGE
INTRAMUSCULAR | Status: AC
  Filled 2020-04-21: qty 5

## 2020-04-21 MED ORDER — PROPOFOL 500 MG/50ML IV EMUL
INTRAVENOUS | Status: AC
  Filled 2020-04-21: qty 50

## 2020-04-21 SURGICAL SUPPLY — 35 items
BINDER BREAST XLRG (GAUZE/BANDAGES/DRESSINGS) ×2 IMPLANT
BLADE SURG 15 STRL LF DISP TIS (BLADE) ×1 IMPLANT
BLADE SURG 15 STRL SS (BLADE) ×1
CHLORAPREP W/TINT 26 (MISCELLANEOUS) ×2 IMPLANT
COVER BACK TABLE 60X90IN (DRAPES) ×2 IMPLANT
COVER MAYO STAND STRL (DRAPES) ×2 IMPLANT
COVER PROBE W GEL 5X96 (DRAPES) ×2 IMPLANT
DERMABOND ADVANCED (GAUZE/BANDAGES/DRESSINGS) ×1
DERMABOND ADVANCED .7 DNX12 (GAUZE/BANDAGES/DRESSINGS) ×1 IMPLANT
DRAPE LAPAROTOMY 100X72 PEDS (DRAPES) ×2 IMPLANT
DRAPE UTILITY XL STRL (DRAPES) ×2 IMPLANT
ELECT COATED BLADE 2.86 ST (ELECTRODE) ×2 IMPLANT
ELECT REM PT RETURN 9FT ADLT (ELECTROSURGICAL) ×2
ELECTRODE REM PT RTRN 9FT ADLT (ELECTROSURGICAL) ×1 IMPLANT
GLOVE SRG 8 PF TXTR STRL LF DI (GLOVE) ×1 IMPLANT
GLOVE SURG ENC MOIS LTX SZ6.5 (GLOVE) ×4 IMPLANT
GLOVE SURG LTX SZ8 (GLOVE) ×2 IMPLANT
GLOVE SURG UNDER POLY LF SZ7 (GLOVE) ×2 IMPLANT
GLOVE SURG UNDER POLY LF SZ8 (GLOVE) ×1
GOWN STRL REUS W/ TWL LRG LVL3 (GOWN DISPOSABLE) ×2 IMPLANT
GOWN STRL REUS W/ TWL XL LVL3 (GOWN DISPOSABLE) ×1 IMPLANT
GOWN STRL REUS W/TWL LRG LVL3 (GOWN DISPOSABLE) ×2
GOWN STRL REUS W/TWL XL LVL3 (GOWN DISPOSABLE) ×1
KIT MARKER MARGIN INK (KITS) ×2 IMPLANT
NEEDLE HYPO 25X1 1.5 SAFETY (NEEDLE) ×2 IMPLANT
NS IRRIG 1000ML POUR BTL (IV SOLUTION) ×2 IMPLANT
PACK BASIN DAY SURGERY FS (CUSTOM PROCEDURE TRAY) ×2 IMPLANT
PENCIL SMOKE EVACUATOR (MISCELLANEOUS) ×2 IMPLANT
SLEEVE SCD COMPRESS KNEE MED (STOCKING) ×2 IMPLANT
SPONGE LAP 4X18 RFD (DISPOSABLE) ×2 IMPLANT
SUT MNCRL AB 4-0 PS2 18 (SUTURE) ×2 IMPLANT
SUT VICRYL 3-0 CR8 SH (SUTURE) ×2 IMPLANT
SYR CONTROL 10ML LL (SYRINGE) ×2 IMPLANT
TOWEL GREEN STERILE FF (TOWEL DISPOSABLE) ×2 IMPLANT
TRAY FAXITRON CT DISP (TRAY / TRAY PROCEDURE) ×2 IMPLANT

## 2020-04-21 NOTE — Anesthesia Preprocedure Evaluation (Signed)
Anesthesia Evaluation  Patient identified by MRN, date of birth, ID band Patient awake    Reviewed: Allergy & Precautions, NPO status   History of Anesthesia Complications (+) PONV  Airway Mallampati: II  TM Distance: >3 FB Neck ROM: Full    Dental no notable dental hx.    Pulmonary    Pulmonary exam normal breath sounds clear to auscultation       Cardiovascular + DOE  Normal cardiovascular exam Rhythm:Regular Rate:Normal     Neuro/Psych    GI/Hepatic negative GI ROS, Neg liver ROS,   Endo/Other  diabetes, Type 2  Renal/GU negative Renal ROS     Musculoskeletal  (+) Arthritis , Osteoarthritis,    Abdominal (+) + obese,   Peds  Hematology   Anesthesia Other Findings   Reproductive/Obstetrics                             Anesthesia Physical  Anesthesia Plan  ASA: II  Anesthesia Plan: General   Post-op Pain Management:    Induction: Intravenous  PONV Risk Score and Plan: 4 or greater and Ondansetron, Dexamethasone, Midazolam and Droperidol  Airway Management Planned: LMA  Additional Equipment:   Intra-op Plan:   Post-operative Plan: Extubation in OR  Informed Consent: I have reviewed the patients History and Physical, chart, labs and discussed the procedure including the risks, benefits and alternatives for the proposed anesthesia with the patient or authorized representative who has indicated his/her understanding and acceptance.     Dental advisory given  Plan Discussed with: CRNA  Anesthesia Plan Comments:         Anesthesia Quick Evaluation

## 2020-04-21 NOTE — H&P (Signed)
Landis Gandy  Location: [facility] Surgery Patient #: [id number] DOB: 1960/11/02 Married / Language: [name] / Race: White Female  History of Present Illness Marcello Moores A. Cable Fearn MD; 02/01/2020 12:57 PM) Patient words: Patient presents for evaluation of abnormal right breast mammogram. Area/ density discovered on recent mammogram right upper central into the inner quadrant which underwent core biopsy proven to be consistent with papilloma. History of left breast papilloma in 2020 status post lumpectomy. No family history of breast cancer. Patient recently diagnosed with Parkinson's which is mild. She is also undergoing evaluation of her feet by a podiatrist due to numbness and pain. No history of breast masses, nipple discharge or change in either breast.                  Patient returns today to evaluate a possible RIGHT breast mass with calcifications and possible axillary adenopathy identified on recent screening mammogram.  EXAM: DIGITAL DIAGNOSTIC RIGHT MAMMOGRAM WITH CAD AND TOMO  ULTRASOUND RIGHT BREAST  ULTRASOUND LEFT AXILLA  COMPARISON: Previous exams including recent screening mammogram dated 10/26/2019.  ACR Breast Density Category c: The breast tissue is heterogeneously dense, which may obscure small masses.  FINDINGS: Grouped coarse, dystrophic and punctate calcifications are confirmed within the upper subareolar RIGHT breast, spanning approximately 9 mm, with underlying mass.  Mammographic images were processed with CAD.  RIGHT breast: Targeted ultrasound is performed, showing an irregular hypoechoic mass with associated calcifications in the RIGHT breast at the 12 o'clock axis, 1 cm from the nipple, measuring 1.2 x 0.9 x 1 cm, corresponding to the mammographic findings.  LEFT axilla: Targeted ultrasound is performed, showing 3 morphologically abnormal lymph nodes, with abnormal  cortical thickness.  IMPRESSION: 1. Irregular mass with associated calcifications in the RIGHT breast at the 12 o'clock axis, 1 cm from the nipple, measuring 1.2 cm, corresponding to the mammographic findings. This may represent a papillary lesion. Ultrasound-guided biopsy is recommended. 2. 3 morphologically abnormal lymph nodes within the LEFT axilla. Ultrasound-guided biopsy is recommended for 1 of these lymph nodes.  RECOMMENDATION: 1. Ultrasound-guided biopsy of the RIGHT breast mass at the 12 o'clock axis, 1 cm from the nipple, measuring 1.2 cm. 2. Ultrasound-guided biopsy of 1 of the morphologically abnormal lymph nodes in the LEFT axilla.  Ultrasound-guided biopsies are scheduled for November 19th.  I have discussed the findings and recommendations with the patient. If applicable, a reminder letter will be sent to the patient regarding the next appointment.  BI-RADS CATEGORY 4: Suspicious.   Electronically Signed By: Franki Cabot M.D. On: 11/13/2019 12:19.  The patient is a 60 year old female.   Medication History Vitamin D (1000UNIT Tablet, Oral) Active. Contour Next Test (In Vitro) Active. HumaLOG (100UNIT/ML Solution, Subcutaneous) Active. Levothyroxine Sodium (125MCG Tablet, Oral) Active. Pravastatin Sodium (40MG  Tablet, Oral) Active. Ramipril (10MG  Capsule, Oral) Active. Medications Reconciled     Physical Exam   General Mental Status-Alert. General Appearance-Consistent with stated age. Hydration-Well hydrated. Voice-Normal.  Head and Neck Head-normocephalic, atraumatic with no lesions or palpable masses. Trachea-midline. Thyroid Gland Characteristics - normal size and consistency.  Eye Eyeball - Bilateral-Extraocular movements intact. Sclera/Conjunctiva - Bilateral-No scleral icterus.  Breast Breast - Left-Symmetric, Non Tender, No Biopsy scars, no Dimpling - Left, No Inflammation, No  Lumpectomy scars, No Mastectomy scars, No [place]. Breast - Right-Symmetric, Non Tender, No Biopsy scars, no Dimpling - Right, No Inflammation, No Lumpectomy scars, No Mastectomy scars, No [place]. Breast Lump-No Palpable Breast Mass.  Neurologic Note: Mild  tremor at rest. No colic we'll rigidity.  Musculoskeletal Normal Exam - Left-Upper Extremity Strength Normal and Lower Extremity Strength Normal. Normal Exam - Right-Upper Extremity Strength Normal and Lower Extremity Strength Normal.  Lymphatic Head & Neck  General Head & Neck Lymphatics: Bilateral - Description - Normal. Axillary  General Axillary Region: Bilateral - Description - Normal. Tenderness - Non Tender.    Assessment & Plan   PAPILLOMA OF RIGHT BREAST (D24.1) Impression: Recommend right breast lumpectomy. She is undergoing evaluation of foot pain by her podiatrist and this may be early onset of neuropathy secondary to her long-standing diabetes. I told her we can delay surgery to later on. The spring once her evaluation is done. She may also need to see a vascular surgeon. Plan on lumpectomy in the spring once her schedule permits. Discussed right breast seed localized lumpectomy. Risk of lumpectomy include bleeding, infection, seroma, more surgery, use of seed/wire, wound care, cosmetic deformity and the need for other treatments, death , blood clots, death. Pt agrees to proceed.  Current Plans Pt Education - [facility] Breast Biopsy HCI: discussed with patient and provided information.

## 2020-04-21 NOTE — Interval H&P Note (Signed)
History and Physical Interval Note:  04/21/2020 7:10 AM  Baxter Hire  has presented today for surgery, with the diagnosis of PAPILLOMA.  The various methods of treatment have been discussed with the patient and family. After consideration of risks, benefits and other options for treatment, the patient has consented to  Procedure(s): RIGHT BREAST LUMPECTOMY WITH RADIOACTIVE SEED LOCALIZATION (Right) as a surgical intervention.  The patient's history has been reviewed, patient examined, no change in status, stable for surgery.  I have reviewed the patient's chart and labs.  Questions were answered to the patient's satisfaction.     [place]

## 2020-04-21 NOTE — Transfer of Care (Signed)
Immediate Anesthesia Transfer of Care Note  Patient: Chelsea Mercado  Procedure(s) Performed: RIGHT BREAST LUMPECTOMY WITH RADIOACTIVE SEED LOCALIZATION (Right Breast)  Patient Location: PACU  Anesthesia Type:General  Level of Consciousness: awake, alert  and oriented  Airway & Oxygen Therapy: Patient Spontanous Breathing and Patient connected to face mask oxygen  Post-op Assessment: Report given to RN and Post -op Vital signs reviewed and stable  Post vital signs: Reviewed and stable  Last Vitals:  Vitals Value Taken Time  BP 126/66 04/21/20 0825  Temp    Pulse 87 04/21/20 0828  Resp 14 04/21/20 0828  SpO2 100 % 04/21/20 0828  Vitals shown include unvalidated device data.  Last Pain:  Vitals:   04/21/20 0649  TempSrc: Oral  PainSc: 0-No pain      Patients Stated Pain Goal: 6 (77/93/96 [id number])  Complications: No complications documented.

## 2020-04-21 NOTE — Anesthesia Procedure Notes (Signed)
Procedure Name: LMA Insertion Date/Time: 04/21/2020 7:39 AM Performed by: Genelle Bal, CRNA Pre-anesthesia Checklist: Patient identified, Emergency Drugs available, Suction available and Patient being monitored Patient Re-evaluated:Patient Re-evaluated prior to induction Oxygen Delivery Method: Circle system utilized Preoxygenation: Pre-oxygenation with 100% oxygen Induction Type: IV induction Ventilation: Mask ventilation without difficulty LMA: LMA inserted LMA Size: 4.0 Number of attempts: 1 Airway Equipment and Method: Bite block Placement Confirmation: positive ETCO2 Tube secured with: Tape Dental Injury: Teeth and Oropharynx as per pre-operative assessment

## 2020-04-21 NOTE — Anesthesia Postprocedure Evaluation (Signed)
Anesthesia Post Note  Patient: Amariyana Heacox  Procedure(s) Performed: RIGHT BREAST LUMPECTOMY WITH RADIOACTIVE SEED LOCALIZATION (Right Breast)     Patient location during evaluation: PACU Anesthesia Type: General Level of consciousness: awake and alert Pain management: pain level controlled Vital Signs Assessment: post-procedure vital signs reviewed and stable Respiratory status: spontaneous breathing, nonlabored ventilation and respiratory function stable Cardiovascular status: blood pressure returned to baseline and stable Postop Assessment: no apparent nausea or vomiting Anesthetic complications: no   No complications documented.  Last Vitals:  Vitals:   04/21/20 0845 04/21/20 0855  BP: 138/77 (!) 141/70  Pulse: 84 86  Resp: 19 18  Temp:  36.5 C  SpO2: 99% 100%    Last Pain:  Vitals:   04/21/20 0855  TempSrc:   PainSc: 0-No pain                 Lynda Rainwater

## 2020-04-21 NOTE — Op Note (Signed)
eoperative diagnosis: right  breast papilloma  Postoperative diagnosis: Same   Procedure: right  breast seed localized lumpectomy  Surgeon: Erroll Luna M.D.  Anesthesia: Gen. With 0.25% Sensorcaine local  EBL: 20 cc  Specimen: right  breast tissue with clip and radioactive seed in the specimen. Verified with neoprobe and radiographic image showing both seed and clip in specimen  Indications for procedure: The patient presents for right  breast seed lumpectomy after core biopsy showed papilloma. Discussed the rationale for considering excision. Small risk of malignancy associated with papilloma lesion after core biopsy. Discussed observation. Discussed wire/seed  localization. Patient desired excision of  Right breast papilloma.The procedure has been discussed with the patient. Alternatives to surgery have been discussed with the patient.  Risks of surgery include bleeding,  Cosmesis,  Infection,  Seroma formation, death,  and the need for further surgery.   The patient understands and wishes to proceed.   Description of procedure: Patient underwent seed placement as an outpatient. Patient presents today for right  breast seed localized lumpectomy. Patient seen in the  holding area. Questions are answered and neoprobe used to verify seed location. Patient taken back to the operating room and placed upon the OR table. After induction of general anesthesia, right  Breast was  prepped and draped in a sterile fashion. Timeout was done to verify proper site and  procedure. Neoprobe used and hot spot identified and right breast under the NAC.Marland Kitchen This was marked with pen. Curvilinear incision made along the lateral border of the NAC. Dissection used with the help of a neoprobe around the tissue where the seed and clip were located. Tissue removed in its entirety with gross  Negative margins.Marland Kitchen Neoprobe used and seed/ clip within the  specimen. Radiographs taken which show clip and seed  In specimen.    Additional posterior margin removed due to residual calcifications.      Hemostasis achieved and cavity closed with 3-0 Vicryl and 4-0 Monocryl. Local infiltrated into the cavity.  Antibiotic irrigation used.   Dermabond applied. All final counts found to be correct. Specimen transported to pathology. Patient awoke extubated taken to recovery in satisfactory condition.

## 2020-04-21 NOTE — Discharge Instructions (Signed)
[place] Office Phone Number [phone]  BREAST BIOPSY/ PARTIAL MASTECTOMY: POST OP INSTRUCTIONS  Always review your discharge instruction sheet given to you by the facility where your surgery was performed.  IF YOU HAVE DISABILITY OR FAMILY LEAVE FORMS, YOU MUST BRING THEM TO THE OFFICE FOR PROCESSING.  DO NOT GIVE THEM TO YOUR DOCTOR.  1. A prescription for pain medication may be given to you upon discharge.  Take your pain medication as prescribed, if needed.  If narcotic pain medicine is not needed, then you may take acetaminophen (Tylenol) or ibuprofen (Advil) as needed. 2. Take your usually prescribed medications unless otherwise directed 3. If you need a refill on your pain medication, please contact your pharmacy.  They will contact our office to request authorization.  Prescriptions will not be filled after 5pm or on week-ends. 4. You should eat very light the first 24 hours after surgery, such as soup, crackers, pudding, etc.  Resume your normal diet the day after surgery. 5. Most patients will experience some swelling and bruising in the breast.  Ice packs and a good support bra will help.  Swelling and bruising can take several days to resolve.  6. It is common to experience some constipation if taking pain medication after surgery.  Increasing fluid intake and taking a stool softener will usually help or prevent this problem from occurring.  A mild laxative (Milk of Magnesia or Miralax) should be taken according to package directions if there are no bowel movements after 48 hours. 7. Unless discharge instructions indicate otherwise, you may remove your bandages 24-48 hours after surgery, and you may shower at that time.  You may have steri-strips (small skin tapes) in place directly over the incision.  These strips should be left on the skin for 7-10 days.  If your surgeon used skin glue on the incision, you may shower in 24 hours.  The glue will flake off over the  next 2-3 weeks.  Any sutures or staples will be removed at the office during your follow-up visit. 8. ACTIVITIES:  You may resume regular daily activities (gradually increasing) beginning the next day.  Wearing a good support bra or sports bra minimizes pain and swelling.  You may have sexual intercourse when it is comfortable. a. You may drive when you no longer are taking prescription pain medication, you can comfortably wear a seatbelt, and you can safely maneuver your car and apply brakes. b. RETURN TO WORK:  ______________________________________________________________________________________ 9. You should see your doctor in the office for a follow-up appointment approximately two weeks after your surgery.  Your doctor's nurse will typically make your follow-up appointment when she calls you with your pathology report.  Expect your pathology report 2-3 business days after your surgery.  You may call to check if you do not hear from [place] after three days. 10. OTHER INSTRUCTIONS: _______________________________________________________________________________________________ _____________________________________________________________________________________________________________________________________ _____________________________________________________________________________________________________________________________________ _____________________________________________________________________________________________________________________________________  WHEN TO CALL YOUR DOCTOR: 1. Fever over 101.0 2. Nausea and/or vomiting. 3. Extreme swelling or bruising. 4. Continued bleeding from incision. 5. Increased pain, redness, or drainage from the incision.  The clinic staff is available to answer your questions during regular business hours.  Please don't hesitate to call and ask to speak to one of the nurses for clinical concerns.  If you have a medical emergency, go to the nearest  emergency room or call 911.  A surgeon from [facility] is always on call at the hospital.  For further questions, please visit [email or web address]  No ibuprofen before 1pm today, if needed.  Post Anesthesia Home Care Instructions  Activity: Get plenty of rest for the remainder of the day. A responsible individual must stay with you for 24 hours following the procedure.  For the next 24 hours, DO NOT: -Drive a car -Paediatric nurse -Drink alcoholic beverages -Take any medication unless instructed by your physician -Make any legal decisions or sign important papers.  Meals: Start with liquid foods such as gelatin or soup. Progress to regular foods as tolerated. Avoid greasy, spicy, heavy foods. If nausea and/or vomiting occur, drink only clear liquids until the nausea and/or vomiting subsides. Call your physician if vomiting continues.  Special Instructions/Symptoms: Your throat may feel dry or sore from the anesthesia or the breathing tube placed in your throat during surgery. If this causes discomfort, gargle with warm salt water. The discomfort should disappear within 24 hours.  If you had a scopolamine patch placed behind your ear for the management of post- operative nausea and/or vomiting:  1. The medication in the patch is effective for 72 hours, after which it should be removed.  Wrap patch in a tissue and discard in the trash. Wash hands thoroughly with soap and water. 2. You may remove the patch earlier than 72 hours if you experience unpleasant side effects which may include dry mouth, dizziness or visual disturbances. 3. Avoid touching the patch. Wash your hands with soap and water after contact with the patch.

## 2020-04-22 LAB — SURGICAL PATHOLOGY

## 2020-04-25 ENCOUNTER — Encounter (HOSPITAL_BASED_OUTPATIENT_CLINIC_OR_DEPARTMENT_OTHER): Payer: Self-pay | Admitting: Surgery

## 2020-05-13 ENCOUNTER — Other Ambulatory Visit: Payer: Self-pay | Admitting: Internal Medicine

## 2020-05-18 ENCOUNTER — Other Ambulatory Visit: Payer: 59

## 2020-05-20 ENCOUNTER — Ambulatory Visit: Payer: 59 | Admitting: Family Medicine

## 2020-05-20 ENCOUNTER — Encounter: Payer: Self-pay | Admitting: Family Medicine

## 2020-05-20 ENCOUNTER — Other Ambulatory Visit: Payer: Self-pay

## 2020-05-20 VITALS — BP 110/70 | HR 88 | Temp 98.0°F | Wt 187.8 lb

## 2020-05-20 DIAGNOSIS — B351 Tinea unguium: Secondary | ICD-10-CM

## 2020-05-20 MED ORDER — TERBINAFINE HCL 250 MG PO TABS: 250.0000 mg | ORAL_TABLET | Freq: Every day | ORAL | 0 refills | Status: DC

## 2020-05-20 NOTE — Progress Notes (Signed)
   Subjective:    Patient ID: Chelsea Mercado, female    DOB: October 10, 1960, 60 y.o.   MRN: [id number]  HPI Here to check a toenail that started to change colors and split 2 months ago. It is not painful.    Review of Systems  Constitutional: Negative.   Respiratory: Negative.   Cardiovascular: Negative.        Objective:   Physical Exam Constitutional:      Appearance: Normal appearance.  Cardiovascular:     Rate and Rhythm: Normal rate and regular rhythm.     Pulses: Normal pulses.     Heart sounds: Normal heart sounds.  Pulmonary:     Effort: Pulmonary effort is normal.     Breath sounds: Normal breath sounds.  Skin:    Comments: The left 3rd toenail is thick and yellow, and the upper layer is peeling off   Neurological:     Mental Status: She is alert.           Assessment & Plan:  Onychomycosis, treat with 90 days of Terbinafine.  Alysia Penna, MD

## 2020-05-25 ENCOUNTER — Other Ambulatory Visit: Payer: Self-pay | Admitting: Internal Medicine

## 2020-05-27 ENCOUNTER — Ambulatory Visit: Payer: 59 | Admitting: Internal Medicine

## 2020-05-27 ENCOUNTER — Encounter: Payer: Self-pay | Admitting: Internal Medicine

## 2020-05-27 ENCOUNTER — Other Ambulatory Visit: Payer: Self-pay

## 2020-05-27 VITALS — BP 128/72 | HR 86 | Ht 64.0 in | Wt 183.4 lb

## 2020-05-27 DIAGNOSIS — E039 Hypothyroidism, unspecified: Secondary | ICD-10-CM

## 2020-05-27 DIAGNOSIS — E1065 Type 1 diabetes mellitus with hyperglycemia: Secondary | ICD-10-CM | POA: Diagnosis not present

## 2020-05-27 DIAGNOSIS — E782 Mixed hyperlipidemia: Secondary | ICD-10-CM

## 2020-05-27 DIAGNOSIS — E041 Nontoxic single thyroid nodule: Secondary | ICD-10-CM | POA: Diagnosis not present

## 2020-05-27 DIAGNOSIS — E1069 Type 1 diabetes mellitus with other specified complication: Secondary | ICD-10-CM

## 2020-05-27 LAB — POCT GLYCOSYLATED HEMOGLOBIN (HGB A1C): Hemoglobin A1C: 7.7 % — AB (ref 4.0–5.6)

## 2020-05-27 NOTE — Progress Notes (Signed)
Patient ID: Chelsea Mercado, female   DOB: [birth year], 60 y.o.   MRN: [id number]   This visit occurred during the SARS-CoV-2 public health emergency.  Safety protocols were in place, including screening questions prior to the visit, additional usage of staff PPE, and extensive cleaning of exam room while observing appropriate contact time as indicated for disinfecting solutions.   HPI: Chelsea Mercado is a 60 y.o.-year-old female, returning for f/u for DM1, dx'ed at 60 y/o, uncontrolled, without long term complications and hypothyroidism. She saw endocrinology before: Dr. Cherie Dark.  Last visit with me 4 months ago.  Interim history: She has Parkinson's disease.  She finished physical therapy before last visit.  She is now on levodopa/carbidopa. She had right breast surgery since last visit -benign lesion.  She previously had surgery in the left breast. No increased urination, blurry vision, nausea.  Reviewed HbA1c levels: Lab Results  Component Value Date   HGBA1C 7.6 (A) 01/14/2020   HGBA1C 7.7 (A) 09/10/2019   HGBA1C 8.0 (A) 04/30/2019  02/14/2016: HbA1c 7.6%  Insulin pump: -Previously Medtronic 630 G- on her arm -started 2016-2017, newer pump started 01/2017 -Change to Medtronic 770 G on 04/30/2019   CGM: -She is not wearing the CGM as she mentions that she does not have enough place on her body to wear both the pump and the CGM  Insulin: -Humalog  Pump settings: - basal rates: 12 AM: 0.775 units/h 2 AM: 0.800 5 AM: 0.950 9 AM: 0.775  - ICR: 1:10 except 12-2 pm: 1:9 - target: 100-120 - ISF: 50 - Active insulin time: 3 hours - bolus wizard: on TDD from basal insulin:  60% (19 units) >> 60% >> 60% >> 60% >> 62% TDD from bolus insulin: 40% (13 units) >> 40% >> 40% >> 40% >> 38% Total daily dose: 31.7-50 >> up to 50 units  - changes infusion site: q 3-4 days  - Meter: Bayer Contour >> AccuChek Guide  Pt checks her sugars 4 times a day-  ave 140 +/- 43 >> 151 +/- 51 >> 154 +/- 46: -  am: 71, 79-154, 166, 189, 215 >> 80-213, 256 >> 84-190, 202, 224 - 2h after b'fast: n/c >> 115 >> 61 (after correction) - lunch: 78-157, 177 >> 93-229 >> 76-182, 235 - 2h after lunch: >> 176 >> 223 >> n/c - dinner: 82-167, 208 >> 118-235 >> 105-198, 225 - 2h after dinner:171, 177 >> 69 x1, 82-214 >> 143-211, 260 - bedtime: 111, 185 >> see above    Previously:   Lowest sugar was  58 >> 70 >> 50s (after walking) >> 60; she has hypoglycemia awareness in the 60s.  She had 1 previous ED visit for hypoglycemia many years ago.  She has inhaled glucagon at home. Highest sugar was 556 (infusion site pbs) ...>> 350 (?) >> 300 >> 260.  No previous DKA admissions.  Pt's meals are: - Breakfast: egg, bread, milk; milk + cereals; coffee - Lunch: Sandwich, soup, salad, fruit, chips; milk - Dinner: Meat, potatoes, brown rice, veggies or salad - Snacks: 1 She was walking 2-3 times a day (after meals) up to 1 hour a day, but stopped during the coronavirus pandemic.  She started exercise before last visit.  -No CKD, last BUN/creatinine:  Lab Results  Component Value Date   BUN 13 04/18/2020   BUN 19 09/10/2019   CREATININE 0.90 04/18/2020   CREATININE 0.89 09/10/2019   On ramipril 10. -+ HL. Last set of lipids: Lab Results  Component  Value Date   CHOL 157 09/10/2019   HDL 55.70 09/10/2019   LDLCALC 83 09/10/2019   TRIG 90.0 09/10/2019   CHOLHDL 3 09/10/2019  Previously on atorvastatin 40, now Crestor 20. - last eye exam was in 06/2019: No DR reportedly - Dr. Prudencio Burly - No numbness and tingling in her feet.  Pt has no FH of DM.  Hypothyroidism:  Reviewed her TFTs: Lab Results  Component Value Date   TSH 0.80 09/10/2019  02/10/2016: TSH 6.671 08/06/2015: TSH 4.35 03/26/2015: TSH 4.37 09/28/2014: TSH 2.73  Pt is on levothyroxine 125 mcg daily, taken: - in am - fasting - at least 30 min from b'fast - no calcium - no iron - no multivitamins - no PPIs - not on Biotin  She has a  history of vitamin D deficiency: Lab Results  Component Value Date   VD25OH 63.7 09/10/2019   VD25OH 57.81 09/01/2018   VD25OH 47.47 10/01/2016  Prev: 02/10/2016: vit D 31 08/06/2015: vit D 21  She continues on 5000 units vitamin D daily.  She saw Dr. Brantley Stage for removal of a premalignant breast lesion.  She had breast lumpectomy with radioactive seed localization.  She sees Dr. Carles Collet for Parkinson's disease.  Her mother had Merkel Cell CA - stage 4. She passed away 2018-02-05.  She just started Lamisil 1 week ago.  ROS: Constitutional: no weight gain/no weight loss, no fatigue, no subjective hyperthermia, no subjective hypothermia Eyes: no blurry vision, no xerophthalmia ENT: no sore throat, no nodules palpated in neck, no dysphagia, no odynophagia, no hoarseness Cardiovascular: no CP/no SOB/no palpitations/no leg swelling Respiratory: no cough/no SOB/no wheezing Gastrointestinal: no N/no V/no D/no C/no acid reflux Musculoskeletal: no muscle aches/no joint aches Skin: no rashes, no hair loss Neurological: + tremors/no numbness/no tingling/no dizziness  I reviewed pt's medications, allergies, PMH, social hx, family hx, and changes were documented in the history of present illness. Otherwise, unchanged from my initial visit note..  Past Medical History:  Diagnosis Date  . Anemia   . Arthritis   . Controlled type 1 diabetes mellitus without complication ([place]) 64/33/2951  . Hyperlipidemia 06/22/2013  . Obesity, Class I, BMI 30-34.9 06/22/2013  . Parkinson disease ([place])   . PONV (postoperative nausea and vomiting)   . Post menopausal syndrome 06/22/2013  . Primary hypothyroidism 06/22/2013  . Vitamin D deficiency 09/24/2013   Past Surgical History:  Procedure Laterality Date  . APPENDECTOMY    . BREAST EXCISIONAL BIOPSY Left    papilloma  . BREAST LUMPECTOMY WITH RADIOACTIVE SEED LOCALIZATION Left 06/10/2018   Procedure: LEFT BREAST LUMPECTOMY WITH RADIOACTIVE SEED  LOCALIZATION;  Surgeon: Erroll Luna, MD;  Location: [facility];  Service: General;  Laterality: Left;  . BREAST LUMPECTOMY WITH RADIOACTIVE SEED LOCALIZATION Right 04/21/2020   Procedure: RIGHT BREAST LUMPECTOMY WITH RADIOACTIVE SEED LOCALIZATION;  Surgeon: Erroll Luna, MD;  Location: [facility];  Service: General;  Laterality: Right;  . CESAREAN SECTION  1985  . TONSILLECTOMY     Social History   Social History  . Marital status: Married    Spouse name: N/A  . Number of children: 3   Occupational History  . Homemaker    Social History Main Topics  . Smoking status: Never Smoker  . Smokeless tobacco: Never Used  . Alcohol use No  . Drug use: No   Current Outpatient Medications on File Prior to Visit  Medication Sig Dispense Refill  . ACCU-CHEK GUIDE test strip USE AS INSTRUCTED 3-4 X  A DAY 400 strip 1  . azelastine (ASTELIN) 0.1 % nasal spray Place 2 sprays into both nostrils 2 (two) times daily. Use in each nostril as directed 30 mL 12  . Blood Glucose Monitoring Suppl (ACCU-CHEK GUIDE) w/Device KIT AccuChek Guide LINK - the only meter approved to communicate with her Medtronic 770G insulin pump 1 kit 0  . carbidopa-levodopa (SINEMET IR) 25-100 MG tablet Take 1 tablet by mouth 3 (three) times daily. 7am/11am/4pm 270 tablet 1  . Cholecalciferol (VITAMIN D3) 5000 units CAPS Take 1 capsule by mouth daily.     Marland Kitchen gabapentin (NEURONTIN) 100 MG capsule Take 1 capsule (100 mg total) by mouth 3 (three) times daily. (Patient not taking: No sig reported) 90 capsule 3  . Glucagon 3 MG/DOSE POWD Place 3 mg into the nose once as needed for up to 1 dose. 1 each 11  . HUMALOG 100 UNIT/ML injection USE AS DIRECTED PER PUMP TAKE MAXIMUM 50 UNITS DAILY 60 mL 1  . ibuprofen (ADVIL) 800 MG tablet Take 1 tablet (800 mg total) by mouth every 8 (eight) hours as needed. 30 tablet 0  . ibuprofen (ADVIL) 800 MG tablet Take 1 tablet (800 mg total) by mouth every 8 (eight)  hours as needed. 30 tablet 0  . Insulin Pen Needle (PEN NEEDLES) 32G X 4 MM MISC 1 each by Does not apply route daily. 100 each 11  . Insulin Syringe-Needle U-100 (INSULIN SYRINGE .3CC/31GX5/16") 31G X 5/16" 0.3 ML MISC Use to inject insulin if needed for back up of pump. 100 each 0  . levothyroxine (SYNTHROID) 125 MCG tablet TAKE 1 TABLET BY MOUTH EVERY DAY 90 tablet 3  . naproxen (NAPROSYN) 500 MG tablet Take 1 tablet (500 mg total) by mouth 2 (two) times daily with a meal. (Patient not taking: Reported on 05/20/2020) 60 tablet 1  . pravastatin (PRAVACHOL) 40 MG tablet TAKE 1 TABLET BY MOUTH EVERY DAY 90 tablet 3  . ramipril (ALTACE) 10 MG capsule TAKE 1 CAPSULE BY MOUTH EVERY DAY 90 capsule 3  . terbinafine (LAMISIL) 250 MG tablet Take 1 tablet (250 mg total) by mouth daily. 90 tablet 0  . traMADol (ULTRAM) 50 MG tablet Take 1 tablet (50 mg total) by mouth every 6 (six) hours as needed. (Patient not taking: Reported on 05/20/2020) 20 tablet 0  . vitamin B-12 (CYANOCOBALAMIN) 500 MCG tablet Take 1,000 mcg by mouth daily.     No current facility-administered medications on file prior to visit.   Allergies  Allergen Reactions  . Hydrocodone-Acetaminophen Nausea And Vomiting  . Phenergan [Promethazine]     Diagnosed with parkinsons, instructed by neurologist not to take phenergan   . Propoxyphene Nausea Only  . Tape Rash   Family History  Problem Relation Age of Onset  . Hypertension Mother   . High Cholesterol Mother   . Coronary artery disease Mother 13       CABG  . Tuberculosis Father   . Heart attack Maternal Grandfather        Died from MI  . Coronary artery disease Maternal Uncle 75       CABG  . Lung cancer Maternal Uncle        asbestosis  . CAD Maternal Uncle 54       died - coronary aneurysm  . Hyperlipidemia Sister   . Hypertension Sister   . Hypothyroidism Sister   . Tuberculosis Paternal Grandmother   . Cancer Paternal Grandfather   . Hypothyroidism Sister  PE: BP 128/72 (BP Location: Right Arm, Patient Position: Sitting, Cuff Size: Normal)   Pulse 86   Ht 5' 4"  (1.626 m)   Wt 183 lb 6.4 oz (83.2 kg)   SpO2 97%   BMI 31.48 kg/m  Wt Readings from Last 3 Encounters:  05/27/20 183 lb 6.4 oz (83.2 kg)  05/20/20 187 lb 12.8 oz (85.2 kg)  04/21/20 188 lb 11.4 oz (85.6 kg)   Constitutional: overweight, in NAD Eyes: PERRLA, EOMI, no exophthalmos ENT: moist mucous membranes, no thyromegaly, no cervical lymphadenopathy Cardiovascular: RRR, No MRG Respiratory: CTA B Gastrointestinal: abdomen soft, NT, ND, BS+ Musculoskeletal: no deformities, strength intact in all 4 Skin: moist, warm, no rashes Neurological: + tremor with outstretched hands, DTR normal in all 4  ASSESSMENT: 1. DM1, uncontrolled, without long term complications, but with hyperglycemia  2. Hypothyroidism  3. Thyroid nodule  4.  Hyperlipidemia  PLAN:  1. Patient with longstanding, uncontrolled, type 1 diabetes, on insulin pump, but without the CGM.  She wears her Medtronic pump on her arm due to previous problems with infusion sets while wearing it on the abdomen.  At last visit, she was having some low blood sugars after exercise (she was in physical therapy and was also planning to start a cycling class) and we discussed about how to adjust the insulin before these. -At today's visit, sugars appear to be fluctuating, at or closer to the normal range in the morning and before lunch, but higher before dinner.  Sugars after dinner are also usually  higher.  Reviewing her pump downloads, she is almost always entering 30 to 40 g of carbs into her pump at all meals.  She mentions that she is high when in the carbs correctly.  We discussed about the fact that ideally meals would have approximately the same amount of carbs but, upon her questioning, she does not need to eat a certain amount of carbs with each meal.  However, I do believe that she would be more beneficial for her to  enter in the pump 50% of her proteins as carbs for better blood sugar control.  She agrees to do so.  I advised her that if sugars remain high afterwards, to strengthen her insulin to carb ratios by 0.5. -She only had 1 low blood sugar in the last 2 weeks, and 61, after correcting higher blood sugar before breakfast.  She is more concerned with over correcting low blood sugars and weight recapitulated today the right amount of carbs with which to correct.  She also has glucagon at home. -She recently has developed a rash at the site of her breast surgery and she was started on antihistaminics.  She was wondering whether this could have influence her blood sugars.  We discussed that this would be unlikely. -We also discussed about reducing fatty foods to improve her insulin resistance and I tried to answer her questions.  Recommended the book "Kick Diabetes Diet" by nutritionist Narda Amber.  Specifically, I advised her to eliminate milk and also reduce eggs.  She already cut down a significant amount of fat from her diet and she tried to stay away from ice cream, which she is craving. -I advised her to:  Patient Instructions  Please continue: - basal rates: 12 AM: 0.775 units/h 2 AM: 0.800 5 AM: 0.950 9 AM: 0.775  - ICR: 1:10 except 12-2 pm: 1:9 - target: 100-120 - ISF: 50 - Active insulin time: 3 hours  Please enter 50% of protein  as carbs.  If sugars remain high afterwards, we may need to strengthen your insulin to carb ratio by 0.5.  Please do the following approximately 15 minutes before every meal: - Enter carbs (C) - Enter sugars (S) - Start insulin bolus (I)  If you exercise, decrease the basal rate to 50% for the duration of exercise and 1 hour afterwards. If you exercise within 1.5 h after a meal, decrease the amount of carbs you enter for that meal.  Please continue levothyroxine 125 mcg daily.  Take the thyroid hormone every day, with water, at least 30 minutes before  breakfast, separated by at least 4 hours from: - acid reflux medications - calcium - iron - multivitamins  Please return in 4 months.   - we checked her HbA1c: 7.7% (slightly higher) - advised to check sugars at different times of the day - 4x a day, rotating check times - advised for yearly eye exams >> she is UTD - return to clinic in 4 months     2.  Hypothyroidism  - latest thyroid labs reviewed with pt >> normal: Lab Results  Component Value Date   TSH 0.80 09/10/2019   - she continues on LT4 125 mcg daily - pt feels good on this dose. - we discussed about taking the thyroid hormone every day, with water, >30 minutes before breakfast, separated by >4 hours from acid reflux medications, calcium, iron, multivitamins. Pt. is taking it correctly.  3. H/o Thyroid nodule -No neck compression symptoms -Reviewed on the latest thyroid ultrasound -No further investigation needed  4. HL -Reviewed latest lipid panel from 09/2019: Fractions at goal: Lab Results  Component Value Date   CHOL 157 09/10/2019   HDL 55.70 09/10/2019   LDLCALC 83 09/10/2019   TRIG 90.0 09/10/2019   CHOLHDL 3 09/10/2019  -On Crestor 20 without side effects  Total time spent for the visit: 40 min, in reviewing her pump downloads, discussing her hypo- and hyper-glycemic episodes, reviewing previous labs and pump settings and developing a plan to avoid hypo- and hyper-glycemia.   [name], MD PhD [facility]

## 2020-05-27 NOTE — Patient Instructions (Signed)
Please continue: - basal rates: 12 AM: 0.775 units/h 2 AM: 0.800 5 AM: 0.950 9 AM: 0.775  - ICR: 1:10 except 12-2 pm: 1:9 - target: 100-120 - ISF: 50 - Active insulin time: 3 hours  Please enter 50% of protein as carbs.  If sugars remain high afterwards, we may need to strengthen your insulin to carb ratio by 0.5.  Please do the following approximately 15 minutes before every meal: - Enter carbs (C) - Enter sugars (S) - Start insulin bolus (I)  If you exercise, decrease the basal rate to 50% for the duration of exercise and 1 hour afterwards. If you exercise within 1.5 h after a meal, decrease the amount of carbs you enter for that meal.  Please continue levothyroxine 125 mcg daily.  Take the thyroid hormone every day, with water, at least 30 minutes before breakfast, separated by at least 4 hours from: - acid reflux medications - calcium - iron - multivitamins  Please return in 4 months.

## 2020-05-30 ENCOUNTER — Telehealth: Payer: Self-pay | Admitting: Neurology

## 2020-05-30 ENCOUNTER — Telehealth: Payer: Self-pay

## 2020-05-30 ENCOUNTER — Telehealth: Payer: Self-pay | Admitting: Internal Medicine

## 2020-05-30 DIAGNOSIS — E1065 Type 1 diabetes mellitus with hyperglycemia: Secondary | ICD-10-CM

## 2020-05-30 MED ORDER — ACCU-CHEK SOFTCLIX LANCETS MISC: 3 refills | Status: AC

## 2020-05-30 NOTE — Telephone Encounter (Signed)
Rx sent to preferred pharmacy.

## 2020-05-30 NOTE — Telephone Encounter (Signed)
Nurse line called to report, patient took an extra dose of the cardiopa levidopa, okay per Tat. Nurse notified. Patient was on the other line.

## 2020-05-30 NOTE — Telephone Encounter (Signed)
Pt is needing a refill for the lancets (soft clicks) for her for her accucheck meter.   [id number] - [place], [place] - [address] [place] [address]

## 2020-06-08 ENCOUNTER — Telehealth: Payer: Self-pay

## 2020-06-08 NOTE — Telephone Encounter (Signed)
I spoke with the pt to address her concerns. Pt was advised to stop medication,  if she developed any symptoms of liver disease. Such as: nausea, vomiting, loss of appetite, abdominal pain etc. She was also advised to follow up with new PCP in November.

## 2020-06-08 NOTE — Telephone Encounter (Signed)
Patient is taking current medication-  terbinafine (LAMISIL) 250 MG tablet and states she has spoke to her endocrinologist and the medication has a warning for liver damage and they think she is needing a liver test Please advise.

## 2020-06-15 LAB — HM DIABETES EYE EXAM

## 2020-06-20 ENCOUNTER — Encounter: Payer: Self-pay | Admitting: Family Medicine

## 2020-08-09 NOTE — Progress Notes (Signed)
    Subjective:    CC: L knee pain  I, Molly Weber, LAT, ATC, am serving as scribe for Dr. Lynne Leader.  HPI: Pt is a 60 y/o female presenting w/ L knee pain and swelling intermittently since July. MOI: On 7/25, she went for a walk, and experienced severe L knee pain.  She locates her pain to the anterior/medial aspect. Pt experienced a loud "pop" in her L knee last week when flexing her L knee. Pt notes a "tightness" in the posterior aspect.  Patient was seen at [place] after-hours [facility] urgent [facility].  X-rays were obtained which reportedly were normal.  She received diclofenac gel and meloxicam.  She was offered a steroid injection but denied and because of her diabetes.  L knee mechanical symptoms: yes L knee swelling: yes- slight, ant/med aspect Aggravating factors: walking, knee flex/ext Treatments tried: ice, meloxicam, Voltaren gel  Pertinent review of Systems: No fevers or chills  Relevant historical information: Type 1 diabetes.  A1c in the sevens.   Objective:    Vitals:   08/10/20 1002  BP: 110/74  Pulse: 78  SpO2: 98%   General: Well Developed, well nourished, and in no acute distress.   MSK: Left knee normal appearing Normal motion with crepitation. Tender palpation medial joint line. Slight laxity to MCL stress test. Positive medial McMurray's test. Intact strength. Pulses cap refill and sensation are intact distally.  Lab and Radiology Results  Diagnostic Limited MSK Ultrasound of: Left knee Quad tendon intact normal-appearing Patellar tendon normal.   medial joint line narrowed degenerative with partial extruded medial meniscus. Lateral joint line normal-appearing Posterior knee no Baker's cyst Impression: Probable medial meniscus tear.  Mild medial DJD.     Impression and Recommendations:    Assessment and Plan: 60 y.o. female with left knee pain thought to be related to mild medial DJD, degenerative medial  meniscus tear, and some resulting medial knee laxity and instability.  She is an excellent candidate for a medial off loader and a stabilizing knee brace due to the instability.  Refer to [facility] representative for OA off loader knee brace. She is already failing oral and topical NSAIDs.  She is not a good candidate for steroids given her diabetes.  We will work on authorization now for hyaluronic acid injections which may be helpful.  This does not help would recommend MRI for potential surgical planning.  Recheck in about a month or 6 weeks.Marland Kitchen   PDMP not reviewed this encounter. Orders Placed This Encounter  Procedures   [place] LIMITED JOINT SPACE STRUCTURES LOW LEFT(NO LINKED CHARGES)    Standing Status:   Future    Number of Occurrences:   1    Standing Expiration Date:   02/10/2021    Order Specific Question:   Reason for Exam (SYMPTOM  OR DIAGNOSIS REQUIRED)    Answer:   left knee pain    Order Specific Question:   Preferred imaging location?    Answer:   [place]   No orders of the defined types were placed in this encounter.   Discussed warning signs or symptoms. Please see discharge instructions. Patient expresses understanding.   The above documentation has been reviewed and is accurate and complete Lynne Leader, M.D.

## 2020-08-10 ENCOUNTER — Ambulatory Visit (INDEPENDENT_AMBULATORY_CARE_PROVIDER_SITE_OTHER): Payer: 59 | Admitting: Family Medicine

## 2020-08-10 ENCOUNTER — Other Ambulatory Visit: Payer: Self-pay

## 2020-08-10 ENCOUNTER — Ambulatory Visit: Payer: Self-pay

## 2020-08-10 VITALS — BP 110/74 | HR 78 | Ht 64.0 in | Wt 187.8 lb

## 2020-08-10 DIAGNOSIS — M25562 Pain in left knee: Secondary | ICD-10-CM | POA: Diagnosis not present

## 2020-08-10 DIAGNOSIS — M1712 Unilateral primary osteoarthritis, left knee: Secondary | ICD-10-CM

## 2020-08-10 NOTE — Patient Instructions (Addendum)
Thank you for coming in today.   I will try to get the gel shots authorized.   You should hear from [place] from [facility] about the knee brace.   You should hear form my office soonish about the gel shots.   Try to get the xray images from the urgent care to me if you can. The report would be ok.

## 2020-08-11 ENCOUNTER — Telehealth: Payer: Self-pay | Admitting: Family Medicine

## 2020-08-11 NOTE — Telephone Encounter (Signed)
Patient called to let Dr Georgina Snell aware that she has decided to hold off on the brace for now.  She is going to contact [employer] to let them know as well.

## 2020-08-16 ENCOUNTER — Other Ambulatory Visit: Payer: Self-pay

## 2020-08-16 ENCOUNTER — Encounter: Payer: Self-pay | Admitting: Internal Medicine

## 2020-08-16 MED ORDER — "INSULIN SYRINGE 31G X 5/16"" 0.3 ML MISC": 0 refills | Status: DC

## 2020-08-17 ENCOUNTER — Ambulatory Visit (INDEPENDENT_AMBULATORY_CARE_PROVIDER_SITE_OTHER): Payer: 59 | Admitting: Family Medicine

## 2020-08-17 ENCOUNTER — Ambulatory Visit: Payer: Self-pay

## 2020-08-17 ENCOUNTER — Other Ambulatory Visit: Payer: Self-pay

## 2020-08-17 ENCOUNTER — Encounter: Payer: Self-pay | Admitting: Family Medicine

## 2020-08-17 VITALS — BP 110/62 | HR 93 | Ht 64.0 in | Wt 185.6 lb

## 2020-08-17 DIAGNOSIS — M25562 Pain in left knee: Secondary | ICD-10-CM

## 2020-08-17 DIAGNOSIS — M1712 Unilateral primary osteoarthritis, left knee: Secondary | ICD-10-CM | POA: Diagnosis not present

## 2020-08-17 NOTE — Progress Notes (Signed)
   I, [name], LAT, ATC, am serving as scribe for Dr. Lynne Leader.  Chelsea Mercado is a 60 y.o. female who presents to [place] [facility] today for f/u of L knee pain.  She was last seen by Dr. Georgina Snell on 08/10/20 and was advised to proceed w/ obtaining a medial knee offloader brace through [facility].  She was authorized for L knee gel injections but does not want to proceed w/ them at this point.  Today, pt reports that she feels like her L knee is improving but is still having pain w/ attempts at L knee extension.  She has not proceeded w/ the [facility] medial offloader brace and is wondering if a smaller profile brace or perhaps PT might help.    Pertinent review of systems: No fevers or chills  Relevant historical information: Diabetes.  Parkinson's disease.   Exam:  BP 110/62 (BP Location: Left Arm, Patient Position: Sitting, Cuff Size: Normal)   Pulse 93   Ht '5\' 4"'$  (1.626 m)   Wt 185 lb 9.6 oz (84.2 kg)   SpO2 99%   BMI 31.86 kg/m  General: Well Developed, well nourished, and in no acute distress.   MSK: Left knee mildly tender palpation medial joint line normal knee motion. Slight medial laxity.   Lab and Radiology Results  X-ray images provided by patient from [place] from July 26 personally and independently interpreted today. Medial and patellofemoral DJD mild.  No acute fractures..     Assessment and Plan: 60 y.o. female with left knee pain due to exacerbation of medial compartment DJD and probable medial meniscus degeneration.  Discussed options.  At this point she is doing okay so held off on the hyaluronic acid injections per patient request.  Plan for continued Voltaren gel and recommended compression sleeve.  I think she is a great candidate for the medial off loader knee brace due to the mild instability and the pain that she experiences.  She is not sure about this.  If not improved with the above treatment I think that is a great  option and she should contact the [employer] representative again to get that set up.  Certainly could proceed with hyaluronic acid injections in the future as well.  She will let me know what she wants to do next.  Check back as needed.     Discussed warning signs or symptoms. Please see discharge instructions. Patient expresses understanding.   The above documentation has been reviewed and is accurate and complete Lynne Leader, M.D.

## 2020-08-17 NOTE — Patient Instructions (Signed)
Thank you for coming in today.   I recommend you obtained a compression sleeve to help with your joint problems. There are many options on the market however I recommend obtaining a full knee Body Helix compression sleeve.  You can find information (including how to appropriate measure yourself for sizing) can be found at www.Body http://www.lambert.com/.  Many of these products are health savings account (HSA) eligible.   You can use the compression sleeve at any time throughout the day but is most important to use while being active as well as for 2 hours post-activity.   It is appropriate to ice following activity with the compression sleeve in place.   If this is not enough next step could be the hinges off loader knee brace form [facility] or [employer].   Contact Hoyle Sauer or myself if you want the [employer] brace.   Let me know.

## 2020-08-30 ENCOUNTER — Ambulatory Visit: Payer: 59 | Admitting: Occupational Therapy

## 2020-08-30 ENCOUNTER — Ambulatory Visit: Payer: 59

## 2020-08-30 ENCOUNTER — Ambulatory Visit: Payer: 59 | Admitting: Physical Therapy

## 2020-09-13 ENCOUNTER — Other Ambulatory Visit: Payer: Self-pay | Admitting: Neurology

## 2020-09-13 DIAGNOSIS — G20 Parkinson's disease: Secondary | ICD-10-CM

## 2020-09-16 NOTE — Progress Notes (Deleted)
Assessment/Plan:   1.  Parkinsons Disease  -Continue carbidopa/levodopa 25/100, 1 tablet 3 times per day.   Subjective:   Chelsea Mercado was seen today in follow up for Parkinsons disease.  My previous records were reviewed prior to todays visit as well as outside records available to me.  Started levodopa last visit and she reports that ***.  She has been following with Dr. Lynne Leader for knee pain.  At the end of July, she went for a walk and experienced severe left knee pain.  Last saw him on August 10.  Records are reviewed.  Current prescribed movement disorder medications: Carbidopa/levodopa 25/100, 1 tablet 3 times per day (started last visit)   ALLERGIES:   Allergies  Allergen Reactions   Hydrocodone-Acetaminophen Nausea And Vomiting   Phenergan [Promethazine]     Diagnosed with parkinsons, instructed by neurologist not to take phenergan    Propoxyphene Nausea Only   Tape Rash    CURRENT MEDICATIONS:  Outpatient Encounter Medications as of 09/19/2020  Medication Sig   ACCU-CHEK GUIDE test strip USE AS INSTRUCTED 3-4 X A DAY   Accu-Chek Softclix Lancets lancets Use as instructed to check blood sugar 4 times daily   azelastine (ASTELIN) 0.1 % nasal spray Place 2 sprays into both nostrils 2 (two) times daily. Use in each nostril as directed   Blood Glucose Monitoring Suppl (ACCU-CHEK GUIDE) w/Device KIT AccuChek Guide LINK - the only meter approved to communicate with her Medtronic 770G insulin pump   carbidopa-levodopa (SINEMET IR) 25-100 MG tablet TAKE 1 TABLET BY MOUTH 3 TIMES DAILY. 7AM/11AM/4PM   Cholecalciferol (VITAMIN D3) 5000 units CAPS Take 1 capsule by mouth daily.    gabapentin (NEURONTIN) 100 MG capsule Take 1 capsule (100 mg total) by mouth 3 (three) times daily. (Patient not taking: No sig reported)   Glucagon 3 MG/DOSE POWD Place 3 mg into the nose once as needed for up to 1 dose.   HUMALOG 100 UNIT/ML injection USE AS DIRECTED PER PUMP TAKE MAXIMUM 50 UNITS  DAILY   ibuprofen (ADVIL) 800 MG tablet Take 1 tablet (800 mg total) by mouth every 8 (eight) hours as needed.   ibuprofen (ADVIL) 800 MG tablet Take 1 tablet (800 mg total) by mouth every 8 (eight) hours as needed.   Insulin Pen Needle (PEN NEEDLES) 32G X 4 MM MISC 1 each by Does not apply route daily.   Insulin Syringe-Needle U-100 (INSULIN SYRINGE .3CC/31GX5/16") 31G X 5/16" 0.3 ML MISC Use to inject insulin if needed for back up of pump.   levothyroxine (SYNTHROID) 125 MCG tablet TAKE 1 TABLET BY MOUTH EVERY DAY   naproxen (NAPROSYN) 500 MG tablet Take 1 tablet (500 mg total) by mouth 2 (two) times daily with a meal.   pravastatin (PRAVACHOL) 40 MG tablet TAKE 1 TABLET BY MOUTH EVERY DAY   ramipril (ALTACE) 10 MG capsule TAKE 1 CAPSULE BY MOUTH EVERY DAY   terbinafine (LAMISIL) 250 MG tablet Take 1 tablet (250 mg total) by mouth daily.   traMADol (ULTRAM) 50 MG tablet Take 1 tablet (50 mg total) by mouth every 6 (six) hours as needed. (Patient not taking: No sig reported)   vitamin B-12 (CYANOCOBALAMIN) 500 MCG tablet Take 1,000 mcg by mouth daily.   No facility-administered encounter medications on file as of 09/19/2020.    Objective:   PHYSICAL EXAMINATION:    VITALS:   There were no vitals filed for this visit.   GEN:  The patient appears stated  age and is in NAD. HEENT:  Normocephalic, atraumatic.  The mucous membranes are moist. The superficial temporal arteries are without ropiness or tenderness. CV:  RRR Lungs:  CTAB Neck/HEME:  There are no carotid bruits bilaterally.  Neurological examination:  Orientation: The patient is alert and oriented x3. Cranial nerves: There is good facial symmetry with mild facial hypomimia. The speech is fluent and clear. Soft palate rises symmetrically and there is no tongue deviation. Hearing is intact to conversational tone. Sensation: Sensation is intact to light touch throughout Motor: Strength is at least antigravity x4.  Movement  examination:  Tone: There is mild increased tone in the LUE/LLE Abnormal movements: there is LUE rest tremor and mild LLE rest tremor Coordination:  There is mild decremation with RAM's, with finger taps on the L and toe taps on the L Gait and Station: The patient has no difficulty arising out of a deep-seated chair without the use of the hands. The patient's stride length is good.  The patient has a positive pull test.     I have reviewed and interpreted the following labs independently    Chemistry      Component Value Date/Time   NA 137 04/18/2020 1136   K 4.8 04/18/2020 1136   CL 101 04/18/2020 1136   CO2 29 04/18/2020 1136   BUN 13 04/18/2020 1136   CREATININE 0.90 04/18/2020 1136   CREATININE 0.89 09/10/2019 1123      Component Value Date/Time   CALCIUM 9.1 04/18/2020 1136   ALKPHOS 64 11/05/2018 1131   AST 18 09/10/2019 1123   ALT 13 09/10/2019 1123   BILITOT 0.5 09/10/2019 1123       Lab Results  Component Value Date   WBC 7.8 11/19/2019   HGB 13.9 11/19/2019   HCT 41.3 11/19/2019   MCV 90.4 11/19/2019   PLT 311 11/19/2019    Lab Results  Component Value Date   TSH 0.80 09/10/2019     Total time spent on today's visit was *** minutes, including both face-to-face time and nonface-to-face time.  Time included that spent on review of records (prior notes available to me/labs/imaging if pertinent), discussing treatment and goals, answering patient's questions and coordinating care.  Cc:  Pcp, No

## 2020-09-19 ENCOUNTER — Ambulatory Visit: Payer: 59 | Admitting: Neurology

## 2020-09-28 ENCOUNTER — Ambulatory Visit (INDEPENDENT_AMBULATORY_CARE_PROVIDER_SITE_OTHER): Payer: 59 | Admitting: Internal Medicine

## 2020-09-28 ENCOUNTER — Other Ambulatory Visit: Payer: Self-pay

## 2020-09-28 ENCOUNTER — Encounter: Payer: Self-pay | Admitting: Internal Medicine

## 2020-09-28 VITALS — BP 120/68 | HR 82 | Ht 64.0 in | Wt 185.0 lb

## 2020-09-28 DIAGNOSIS — E039 Hypothyroidism, unspecified: Secondary | ICD-10-CM

## 2020-09-28 DIAGNOSIS — E1065 Type 1 diabetes mellitus with hyperglycemia: Secondary | ICD-10-CM | POA: Diagnosis not present

## 2020-09-28 DIAGNOSIS — E782 Mixed hyperlipidemia: Secondary | ICD-10-CM

## 2020-09-28 DIAGNOSIS — E041 Nontoxic single thyroid nodule: Secondary | ICD-10-CM | POA: Diagnosis not present

## 2020-09-28 DIAGNOSIS — E1069 Type 1 diabetes mellitus with other specified complication: Secondary | ICD-10-CM | POA: Diagnosis not present

## 2020-09-28 LAB — LIPID PANEL
Cholesterol: 181 mg/dL (ref 0–200)
HDL: 58.5 mg/dL (ref >39.00)
LDL Cholesterol: 103 mg/dL — ABNORMAL HIGH (ref 0–99)
NonHDL: 122.37
Total CHOL/HDL Ratio: 3
Triglycerides: 98 mg/dL (ref 0.0–149.0)
VLDL: 19.6 mg/dL (ref 0.0–40.0)

## 2020-09-28 LAB — TSH: TSH: 0.23 u[IU]/mL — ABNORMAL LOW (ref 0.35–5.50)

## 2020-09-28 LAB — POCT GLYCOSYLATED HEMOGLOBIN (HGB A1C): Hemoglobin A1C: 7.8 % — AB (ref 4.0–5.6)

## 2020-09-28 LAB — T4, FREE: Free T4: 1.54 ng/dL (ref 0.60–1.60)

## 2020-09-28 LAB — MICROALBUMIN / CREATININE URINE RATIO
Creatinine,U: 43.1 mg/dL
Microalb Creat Ratio: 1.6 mg/g (ref 0.0–30.0)
Microalb, Ur: <0.7 mg/dL (ref 0.0–1.9)

## 2020-09-28 MED ORDER — GLUCAGON 3 MG/DOSE NA POWD: 3.0000 mg | Freq: Once | NASAL | 11 refills | Status: DC | PRN

## 2020-09-28 MED ORDER — "INSULIN SYRINGE-NEEDLE U-100 31G X 5/16"" 0.3 ML MISC": 3 refills | Status: DC

## 2020-09-28 NOTE — Patient Instructions (Addendum)
Please continue: - basal rates: 12 AM: 0.775 2 AM: 0.800  5 AM: 0.950 9 AM: 0.775  - ICR: 1:10 except 12-2 pm: 1:9 - target: 100-120 - ISF: 50 - Active insulin time: 3 hours  If sugars remain high in the next 2 weeks, try to strengthen your insulin to carb ratio by 0.5.  Please do the following approximately 15 minutes before every meal: - Enter carbs (C) - Enter sugars (S) - Start insulin bolus (I)  Please continue levothyroxine 125 mcg daily.  Take the thyroid hormone every day, with water, at least 30 minutes before breakfast, separated by at least 4 hours from: - acid reflux medications - calcium - iron - multivitamins  Please return in 3-4 months.

## 2020-09-28 NOTE — Progress Notes (Signed)
Patient ID: Chelsea Mercado, female   DOB: 1960/04/08, 60 y.o.   MRN: [id number]   This visit occurred during the SARS-CoV-2 public health emergency.  Safety protocols were in place, including screening questions prior to the visit, additional usage of staff PPE, and extensive cleaning of exam room while observing appropriate contact time as indicated for disinfecting solutions.   HPI: Chelsea Mercado is a 60 y.o.-year-old female, returning for f/u for DM1, dx'ed at 60 y/o, uncontrolled, without long term complications and hypothyroidism. She saw endocrinology before: Dr. Cherie Dark.  Last visit with me 4 months ago.  Interim history: She continues on carbidopa/levodopa for Parkinson's disease.  She finished physical therapy. No increased urination, blurry vision, nausea, chest pain. She has a L knee torn meniscus in July - sees orthopedics.  Reviewed HbA1c levels: Lab Results  Component Value Date   HGBA1C 7.7 (A) 05/27/2020   HGBA1C 7.6 (A) 01/14/2020   HGBA1C 7.7 (A) 09/10/2019  02/14/2016: HbA1c 7.6%  Insulin pump: -Previously Medtronic 630 G- on her arm -started 2016-2017, newer pump started 01/2017 -Change to Medtronic 770 G on 04/30/2019   CGM: -She is not wearing the CGM as she mentions that she does not have enough place on her body to wear both the pump and the CGM  Insulin: -Humalog   Pump settings: - basal rates: 12 AM: 0.775 units/h 2 AM: 0.800 5 AM: 0.950 9 AM: 0.775  - ICR: 1:10 except 12-2 pm: 1:9 - target: 100-120 - ISF: 50 - Active insulin time: 3 hours - bolus wizard: on TDD from basal insulin:  60% (19 units) >> 60% >> 60% >> 60% >> 62% >> 58% TDD from bolus insulin: 40% (13 units) >> 40% >> 40% >> 40% >> 38% >> 42% Total daily dose: 31.7-50 >> 33-50 units  - changes infusion site: q 3-4 days  - Meter: Bayer Contour >> AccuChek Guide  Pt checks her sugars 4 times a day-  ave 140 +/- 43 >> 151 +/- 51 >> 154 +/- 46 >> 165  +/-46:   Previously:   Previously:   Lowest sugar was  50s (after walking) >> 60 >> 58; she has hypoglycemia awareness in the 60s.  She had 1 previous ED visit for hypoglycemia many years ago.  She has inhaled glucagon at home. Highest sugar was 556 (infusion site pbs) ...>> 300 >> 260 >> 406 (site pbs).  No previous DKA admissions.  Pt's meals are: - Breakfast: egg, bread, milk; milk + cereals; coffee - Lunch: Sandwich, soup, salad, fruit, chips; milk - Dinner: Meat, potatoes, brown rice, veggies or salad - Snacks: 1 She was walking 2-3 times a day (after meals) up to 1 hour a day, but stopped during the coronavirus pandemic.  She started exercise before last visit.  -No CKD, last BUN/creatinine:  Lab Results  Component Value Date   BUN 13 04/18/2020   BUN 19 09/10/2019   CREATININE 0.90 04/18/2020   CREATININE 0.89 09/10/2019   On ramipril 10.  -+ HL. Last set of lipids: Lab Results  Component Value Date   CHOL 157 09/10/2019   HDL 55.70 09/10/2019   LDLCALC 83 09/10/2019   TRIG 90.0 09/10/2019   CHOLHDL 3 09/10/2019  Previously on atorvastatin 40, now Crestor 20.  - last eye exam was in 06/2020: No DR reportedly - Dr. Prudencio Burly.  - No numbness and tingling in her feet.  Pt has no FH of DM.  Hypothyroidism:  Reviewed her TFTs: Lab Results  Component  Value Date   TSH 0.80 09/10/2019  02/10/2016: TSH 6.671 08/06/2015: TSH 4.35 03/26/2015: TSH 4.37 09/28/2014: TSH 2.73  Pt is on levothyroxine 125 mcg daily, taken: - in am - fasting - at least 30 min from b'fast - no calcium - no iron - no multivitamins - no PPIs - not on Biotin  She has a history of vitamin D deficiency: Lab Results  Component Value Date   VD25OH 63.7 09/10/2019   VD25OH 57.81 09/01/2018   VD25OH 47.47 10/01/2016  Prev: 02/10/2016: vit D 31 08/06/2015: vit D 21  She continues on 5000 units vitamin D daily.  She saw Dr. Brantley Stage for removal of a premalignant breast lesion.  She  had breast lumpectomy with radioactive seed localization.  She sees Dr. Carles Collet for Parkinson's disease.  Her mother had Merkel Cell CA - stage 4. She passed away 03-02-2018.  ROS: + See HPI Neurological: + tremors/no numbness/no tingling/no dizziness  I reviewed pt's medications, allergies, PMH, social hx, family hx, and changes were documented in the history of present illness. Otherwise, unchanged from my initial visit note..  Past Medical History:  Diagnosis Date   Anemia    Arthritis    Controlled type 1 diabetes mellitus without complication ([place]) 74/25/9563   Hyperlipidemia 06/22/2013   Obesity, Class I, BMI 30-34.9 06/22/2013   Parkinson disease ([place])    PONV (postoperative nausea and vomiting)    Post menopausal syndrome 06/22/2013   Primary hypothyroidism 06/22/2013   Vitamin D deficiency 09/24/2013   Past Surgical History:  Procedure Laterality Date   APPENDECTOMY     BREAST EXCISIONAL BIOPSY Left    papilloma   BREAST LUMPECTOMY WITH RADIOACTIVE SEED LOCALIZATION Left 06/10/2018   Procedure: LEFT BREAST LUMPECTOMY WITH RADIOACTIVE SEED LOCALIZATION;  Surgeon: Erroll Luna, MD;  Location: [facility];  Service: General;  Laterality: Left;   BREAST LUMPECTOMY WITH RADIOACTIVE SEED LOCALIZATION Right 04/21/2020   Procedure: RIGHT BREAST LUMPECTOMY WITH RADIOACTIVE SEED LOCALIZATION;  Surgeon: Erroll Luna, MD;  Location: [facility];  Service: General;  Laterality: Right;   Arial History   Social History   Marital status: Married    Spouse name: N/A   Number of children: 3   Occupational History   Homemaker    Social History Main Topics   Smoking status: Never Smoker   Smokeless tobacco: Never Used   Alcohol use No   Drug use: No   Current Outpatient Medications on File Prior to Visit  Medication Sig Dispense Refill   ACCU-CHEK GUIDE test strip USE AS INSTRUCTED 3-4 X A DAY 400 strip 1    Accu-Chek Softclix Lancets lancets Use as instructed to check blood sugar 4 times daily 400 each 3   azelastine (ASTELIN) 0.1 % nasal spray Place 2 sprays into both nostrils 2 (two) times daily. Use in each nostril as directed 30 mL 12   Blood Glucose Monitoring Suppl (ACCU-CHEK GUIDE) w/Device KIT AccuChek Guide LINK - the only meter approved to communicate with her Medtronic 770G insulin pump 1 kit 0   carbidopa-levodopa (SINEMET IR) 25-100 MG tablet TAKE 1 TABLET BY MOUTH 3 TIMES DAILY. 7AM/11AM/4PM 270 tablet 1   Cholecalciferol (VITAMIN D3) 5000 units CAPS Take 1 capsule by mouth daily.      gabapentin (NEURONTIN) 100 MG capsule Take 1 capsule (100 mg total) by mouth 3 (three) times daily. (Patient not taking: No sig reported) 90 capsule 3  Glucagon 3 MG/DOSE POWD Place 3 mg into the nose once as needed for up to 1 dose. 1 each 11   HUMALOG 100 UNIT/ML injection USE AS DIRECTED PER PUMP TAKE MAXIMUM 50 UNITS DAILY 60 mL 1   ibuprofen (ADVIL) 800 MG tablet Take 1 tablet (800 mg total) by mouth every 8 (eight) hours as needed. 30 tablet 0   ibuprofen (ADVIL) 800 MG tablet Take 1 tablet (800 mg total) by mouth every 8 (eight) hours as needed. 30 tablet 0   Insulin Pen Needle (PEN NEEDLES) 32G X 4 MM MISC 1 each by Does not apply route daily. 100 each 11   Insulin Syringe-Needle U-100 (INSULIN SYRINGE .3CC/31GX5/16") 31G X 5/16" 0.3 ML MISC Use to inject insulin if needed for back up of pump. 100 each 0   levothyroxine (SYNTHROID) 125 MCG tablet TAKE 1 TABLET BY MOUTH EVERY DAY 90 tablet 3   naproxen (NAPROSYN) 500 MG tablet Take 1 tablet (500 mg total) by mouth 2 (two) times daily with a meal. 60 tablet 1   pravastatin (PRAVACHOL) 40 MG tablet TAKE 1 TABLET BY MOUTH EVERY DAY 90 tablet 3   ramipril (ALTACE) 10 MG capsule TAKE 1 CAPSULE BY MOUTH EVERY DAY 90 capsule 3   terbinafine (LAMISIL) 250 MG tablet Take 1 tablet (250 mg total) by mouth daily. 90 tablet 0   traMADol (ULTRAM) 50 MG tablet  Take 1 tablet (50 mg total) by mouth every 6 (six) hours as needed. (Patient not taking: No sig reported) 20 tablet 0   vitamin B-12 (CYANOCOBALAMIN) 500 MCG tablet Take 1,000 mcg by mouth daily.     No current facility-administered medications on file prior to visit.   Allergies  Allergen Reactions   Hydrocodone-Acetaminophen Nausea And Vomiting   Phenergan [Promethazine]     Diagnosed with parkinsons, instructed by neurologist not to take phenergan    Propoxyphene Nausea Only   Tape Rash   Family History  Problem Relation Age of Onset   Hypertension Mother    High Cholesterol Mother    Coronary artery disease Mother 33       CABG   Tuberculosis Father    Heart attack Maternal Grandfather        Died from MI   Coronary artery disease Maternal Uncle 75       CABG   Lung cancer Maternal Uncle        asbestosis   CAD Maternal Uncle 53       died - coronary aneurysm   Hyperlipidemia Sister    Hypertension Sister    Hypothyroidism Sister    Tuberculosis Paternal Grandmother    Cancer Paternal Grandfather    Hypothyroidism Sister    PE: BP 120/68 (BP Location: Right Arm, Patient Position: Sitting, Cuff Size: Normal)   Pulse 82   Ht 5' 4" (1.626 m)   Wt 185 lb (83.9 kg)   SpO2 99%   BMI 31.76 kg/m  Wt Readings from Last 3 Encounters:  09/28/20 185 lb (83.9 kg)  08/17/20 185 lb 9.6 oz (84.2 kg)  08/10/20 187 lb 12.8 oz (85.2 kg)   Constitutional: overweight, in NAD Eyes: PERRLA, EOMI, no exophthalmos ENT: moist mucous membranes, no thyromegaly, no cervical lymphadenopathy Cardiovascular: RRR, No MRG Respiratory: CTA B Gastrointestinal: abdomen soft, NT, ND, BS+ Musculoskeletal: no deformities, strength intact in all 4 Skin: moist, warm, no rashes Neurological: + tremor with outstretched hands, DTR normal in all 4  ASSESSMENT: 1. DM1, uncontrolled, without long  term complications, but with hyperglycemia  2. Hypothyroidism  3. Thyroid nodule  4.   Hyperlipidemia  PLAN:  1. Patient with longstanding, uncontrolled, type 1 diabetes, on insulin pump, but without the CGM.  She wears her Medtronic pump on her arm due to previous problems with infusion sets when wearing it on the abdomen.  At last visit, sugars were more fluctuating, after closer to the normal range in the morning and before lunch but higher before dinner.  Sugars after dinner were also usually higher.  Reviewing her pump downloads, she was almost always entering 30 to 40 g of carbs for all meals and I advised her to try to enter the correct amount of carbs.  We also discussed about entering 50% of her protein amount as carbs for better blood sugar control.  I advised her that if her blood sugars remain high after meals, to strengthen her insulin to carb ratios by 0.5.  We also recapitulated how not to overcorrect low blood sugars.  Upon her questioning about how to improve her insulin resistance, at last visit, we discussed about the benefits of a whole food plant-based diet and I recommended the book "Kick Diabetes Diet" by nutritionist Narda Amber.  I recommended to eliminate milk and reduce eggs.  She already cut down a significant amount of fat from the diet and she was trying to stay away from ice cream.  HbA1c at last visit was higher, at 7.7%. -At today's visit, reviewing her pump downloads, it appears that her sugars are very variable after breakfast, around 12 PM, they decrease after this and increase again after dinner and stay higher the entire night.  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  Also, she has been less active after she had a tendon tear in her knee but plans to increase her activity as soon as this improves.  Therefore, she felt that her sugars were more variable recently.  She returned home 2 days ago and sugars are definitely better in the last 2 days.  Reviewing the downloads, she still  appears not to be entering enough carbs into the pump, still only entering 30 to 40 g of carbs per meal.  We have discussed about entering more repeatedly during every visit, but she continues to enter this minimal amount.  For now, we discussed about continuing the same pump settings, I advised her to try to enter more carbs into the pump, commensurate with the amount that she is eating, but if the sugars do not improve in the next 2 weeks, I advised her to strengthen her insulin to carb ratios by 0.5 to improve her sugars after meals, especially with the holidays coming up. -She would be interested in a t:slim insulin pump but I explained that the benefit from this comes mainly if it integrates with a CGM, which she would not want to try. -I advised her to:  Patient Instructions   Patient Instructions  Please continue: - basal rates: 12 AM: 0.775 2 AM: 0.800  5 AM: 0.950 9 AM: 0.775  - ICR: 1:10 except 12-2 pm: 1:9 - target: 100-120 - ISF: 50 - Active insulin time: 3 hours  If sugars remain high in the next 2 weeks, try to strengthen your insulin to carb ratio by 0.5.  Please do the following approximately 15 minutes before every meal: - Enter carbs (C) - Enter sugars (S) - Start insulin bolus (  I)  Please continue levothyroxine 125 mcg daily.  Take the thyroid hormone every day, with water, at least 30 minutes before breakfast, separated by at least 4 hours from: - acid reflux medications - calcium - iron - multivitamins  Please return in 3-4 months.   - we checked her HbA1c: 7.8% (slightly higher) - advised to check sugars at different times of the day - 4x a day, rotating check times - advised for yearly eye exams >> she is UTD - Per her preference, we will check annual labs today - return to clinic in 3-4 months    2.  Hypothyroidism  - latest thyroid labs reviewed with pt. >> normal: Lab Results  Component Value Date   TSH 0.80 09/10/2019  - she continues on LT4 125  mcg daily - pt feels good on this dose. - we discussed about taking the thyroid hormone every day, with water, >30 minutes before breakfast, separated by >4 hours from acid reflux medications, calcium, iron, multivitamins. Pt. is taking it correctly. - will check thyroid tests today: TSH and fT4 - If labs are abnormal, she will need to return for repeat TFTs in 1.5 months  3. H/o Thyroid nodule -No neck compression symptoms -Reviewed latest thyroid ultrasound report -No further investigation needed for this  4. HL -Reviewed latest lipid panel from 09/2019: Fractions at goal: Lab Results  Component Value Date   CHOL 157 09/10/2019   HDL 55.70 09/10/2019   LDLCALC 83 09/10/2019   TRIG 90.0 09/10/2019   CHOLHDL 3 09/10/2019  -She continues on Crestor 20 mg daily without side effects -No further she is due for another lipid panel -we will check today  Component     Latest Ref Rng & Units 09/28/2020  Glucose     65 - 99 mg/dL 157 (H)  BUN     7 - 25 mg/dL 15  Creatinine     0.50 - 1.05 mg/dL 0.83  eGFR     > OR = 60 mL/min/1.48m 81  BUN/Creatinine Ratio     6 - 22 (calc) NOT APPLICABLE  Sodium     1132- 146 mmol/L 138  Potassium     3.5 - 5.3 mmol/L 5.1  Chloride     98 - 110 mmol/L 102  CO2     20 - 32 mmol/L 30  Calcium     8.6 - 10.4 mg/dL 9.6  Total Protein     6.1 - 8.1 g/dL 6.9  Albumin MSPROF     3.6 - 5.1 g/dL 4.0  Globulin     1.9 - 3.7 g/dL (calc) 2.9  AG Ratio     1.0 - 2.5 (calc) 1.4  Total Bilirubin     0.2 - 1.2 mg/dL 0.4  Alkaline phosphatase (APISO)     37 - 153 U/L 64  AST     10 - 35 U/L 15  ALT     6 - 29 U/L 9  Cholesterol     0 - 200 mg/dL 181  Triglycerides     0.0 - 149.0 mg/dL 98.0  HDL Cholesterol     >39.00 mg/dL 58.50  VLDL     0.0 - 40.0 mg/dL 19.6  LDL (calc)     0 - 99 mg/dL 103 (H)  Total CHOL/HDL Ratio      3  NonHDL      122.37  Microalb, Ur     0.0 - 1.9 mg/dL <0.7  Creatinine,U  mg/dL 43.1  MICROALB/CREAT  RATIO     0.0 - 30.0 mg/g 1.6  TSH     0.35 - 5.50 uIU/mL 0.23 (L)  T4,Free(Direct)     0.60 - 1.60 ng/dL 1.54   LDL higher than before, I will check with her if she is taking Crestor consistently.  The rest of the labs are at goal with exception of a slightly high blood sugar and also a slightly low TSH.  She is on 125 mcg levothyroxine daily and we will decrease the dose to 112 and check her test in 1.5 months.  [name], MD PhD [facility]

## 2020-09-29 LAB — COMPLETE METABOLIC PANEL WITH GFR
AG Ratio: 1.4 (calc) (ref 1.0–2.5)
ALT: 9 U/L (ref 6–29)
AST: 15 U/L (ref 10–35)
Albumin: 4 g/dL (ref 3.6–5.1)
Alkaline phosphatase (APISO): 64 U/L (ref 37–153)
BUN: 15 mg/dL (ref 7–25)
CO2: 30 mmol/L (ref 20–32)
Calcium: 9.6 mg/dL (ref 8.6–10.4)
Chloride: 102 mmol/L (ref 98–110)
Creat: 0.83 mg/dL (ref 0.50–1.05)
Globulin: 2.9 g/dL (calc) (ref 1.9–3.7)
Glucose, Bld: 157 mg/dL — ABNORMAL HIGH (ref 65–99)
Potassium: 5.1 mmol/L (ref 3.5–5.3)
Sodium: 138 mmol/L (ref 135–146)
Total Bilirubin: 0.4 mg/dL (ref 0.2–1.2)
Total Protein: 6.9 g/dL (ref 6.1–8.1)
eGFR: 81 mL/min/{1.73_m2} (ref >=60)

## 2020-09-29 MED ORDER — LEVOTHYROXINE SODIUM 112 MCG PO TABS: 112.0000 ug | ORAL_TABLET | Freq: Every day | ORAL | 3 refills | Status: DC

## 2020-09-30 ENCOUNTER — Encounter: Payer: Self-pay | Admitting: Internal Medicine

## 2020-10-05 NOTE — Progress Notes (Signed)
Assessment/Plan:   1.  Parkinsons Disease  -Continue carbidopa/levodopa 25/100, 1 tablet 3 times per day.  Exam was normal today  -she is concerned due to mom/sister with some type of brain CA.  Her exam is now normal post levodopa but reasonable to examine with MRI.   Subjective:   Chelsea Mercado was seen today in follow up for Parkinsons disease.  My previous records were reviewed prior to todays visit as well as outside records available to me.  Started levodopa last visit and she reports that it is working "wonderful."  No SE. She thought that tremor was coming back but thyroid med was adjusted and she did better.  She has been following with Dr. Lynne Leader for knee pain.  At the end of July, she went for a walk and experienced severe left knee pain.  Last saw him on August 10.  Records are reviewed.  Wasn't able to exercise b/c of that.  No falls.  No near syncope.   Pt worried as mom and sister both had brain tumors (sisters sounds like ?possible meningioma but unclear as told initially as not cancerous but later told there were CA cells).  Current prescribed movement disorder medications: Carbidopa/levodopa 25/100, 1 tablet 3 times per day (started last visit)   ALLERGIES:   Allergies  Allergen Reactions   Hydrocodone-Acetaminophen Nausea And Vomiting   Phenergan [Promethazine]     Diagnosed with parkinsons, instructed by neurologist not to take phenergan    Propoxyphene Nausea Only   Tape Rash    CURRENT MEDICATIONS:  Outpatient Encounter Medications as of 10/11/2020  Medication Sig   ACCU-CHEK GUIDE test strip USE AS INSTRUCTED 3-4 X A DAY   Accu-Chek Softclix Lancets lancets Use as instructed to check blood sugar 4 times daily   azelastine (ASTELIN) 0.1 % nasal spray Place 2 sprays into both nostrils 2 (two) times daily. Use in each nostril as directed   Blood Glucose Monitoring Suppl (ACCU-CHEK GUIDE) w/Device KIT AccuChek Guide LINK - the only meter approved to  communicate with her Medtronic 770G insulin pump   carbidopa-levodopa (SINEMET IR) 25-100 MG tablet TAKE 1 TABLET BY MOUTH 3 TIMES DAILY. 7AM/11AM/4PM   Cholecalciferol (VITAMIN D3) 5000 units CAPS Take 1 capsule by mouth daily.    Glucagon 3 MG/DOSE POWD Place 3 mg into the nose once as needed for up to 1 dose.   HUMALOG 100 UNIT/ML injection USE AS DIRECTED PER PUMP TAKE MAXIMUM 50 UNITS DAILY   ibuprofen (ADVIL) 800 MG tablet Take 1 tablet (800 mg total) by mouth every 8 (eight) hours as needed.   ibuprofen (ADVIL) 800 MG tablet Take 1 tablet (800 mg total) by mouth every 8 (eight) hours as needed.   Insulin Syringe-Needle U-100 (BD INSULIN SYRINGE U/F) 31G X 5/16" 0.3 ML MISC Use 4x a day as advised   levothyroxine (SYNTHROID) 112 MCG tablet Take 1 tablet (112 mcg total) by mouth daily.   naproxen (NAPROSYN) 500 MG tablet Take 1 tablet (500 mg total) by mouth 2 (two) times daily with a meal.   pravastatin (PRAVACHOL) 40 MG tablet TAKE 1 TABLET BY MOUTH EVERY DAY   ramipril (ALTACE) 10 MG capsule TAKE 1 CAPSULE BY MOUTH EVERY DAY   vitamin B-12 (CYANOCOBALAMIN) 500 MCG tablet Take 1,000 mcg by mouth daily.   [DISCONTINUED] gabapentin (NEURONTIN) 100 MG capsule Take 1 capsule (100 mg total) by mouth 3 (three) times daily. (Patient not taking: No sig reported)   [DISCONTINUED] terbinafine (  LAMISIL) 250 MG tablet Take 1 tablet (250 mg total) by mouth daily. (Patient not taking: Reported on 10/11/2020)   [DISCONTINUED] traMADol (ULTRAM) 50 MG tablet Take 1 tablet (50 mg total) by mouth every 6 (six) hours as needed. (Patient not taking: No sig reported)   No facility-administered encounter medications on file as of 10/11/2020.    Objective:   PHYSICAL EXAMINATION:    VITALS:   Vitals:   10/11/20 1257  BP: 125/79  Pulse: 72  SpO2: 98%  Weight: 185 lb 12.8 oz (84.3 kg)  Height: 5' 6" (1.676 m)     GEN:  The patient appears stated age and is in NAD. HEENT:  Normocephalic, atraumatic.   The mucous membranes are moist. The superficial temporal arteries are without ropiness or tenderness. CV:  RRR Lungs:  CTAB Neck/HEME:  There are no carotid bruits bilaterally.  Neurological examination:  Orientation: The patient is alert and oriented x3. Cranial nerves: There is good facial symmetry with mild facial hypomimia. The speech is fluent and clear. Soft palate rises symmetrically and there is no tongue deviation. Hearing is intact to conversational tone. Sensation: Sensation is intact to light touch throughout Motor: Strength is at least antigravity x4.  Movement examination:  Tone: There is normal tone in the UE/LE Abnormal movements: there is no tremor Coordination:  There is no decremation, with any form of RAMS, including alternating supination and pronation of the forearm, hand opening and closing, finger taps, heel taps and toe taps. Gait and Station: The patient has no difficulty arising out of a deep-seated chair without the use of the hands. The patient's stride length is good.  She runs and dances in the hall normally.  The patient has a positive pull test.     I have reviewed and interpreted the following labs independently    Chemistry      Component Value Date/Time   NA 138 09/28/2020 1058   K 5.1 09/28/2020 1058   CL 102 09/28/2020 1058   CO2 30 09/28/2020 1058   BUN 15 09/28/2020 1058   CREATININE 0.83 09/28/2020 1058      Component Value Date/Time   CALCIUM 9.6 09/28/2020 1058   ALKPHOS 64 11/05/2018 1131   AST 15 09/28/2020 1058   ALT 9 09/28/2020 1058   BILITOT 0.4 09/28/2020 1058       Lab Results  Component Value Date   WBC 7.8 11/19/2019   HGB 13.9 11/19/2019   HCT 41.3 11/19/2019   MCV 90.4 11/19/2019   PLT 311 11/19/2019    Lab Results  Component Value Date   TSH 0.23 (L) 09/28/2020     Cc:  Inda Coke, [place]

## 2020-10-11 ENCOUNTER — Ambulatory Visit (INDEPENDENT_AMBULATORY_CARE_PROVIDER_SITE_OTHER): Payer: 59 | Admitting: Neurology

## 2020-10-11 ENCOUNTER — Encounter: Payer: Self-pay | Admitting: Neurology

## 2020-10-11 ENCOUNTER — Other Ambulatory Visit: Payer: Self-pay

## 2020-10-11 VITALS — BP 125/79 | HR 72 | Ht 66.0 in | Wt 185.8 lb

## 2020-10-11 DIAGNOSIS — G20 Parkinson's disease: Secondary | ICD-10-CM | POA: Diagnosis not present

## 2020-10-11 NOTE — Patient Instructions (Addendum)
A referral to [facility] has been placed for your MRI someone will contact you directly to schedule your appt. They are located at [place]. Please contact them directly by calling [phone] with any questions regarding your referral.  Continue carbidopa/levodopa 25/100 at 7am/11am/4pm  The physicians and staff at [facility] are committed to providing excellent care. You may receive a survey requesting feedback about your experience at our office. We strive to receive "very good" responses to the survey questions. If you feel that your experience would prevent you from giving the office a "very good " response, please contact our office to try to remedy the situation. We may be reached at [phone]. Thank you for taking the time out of your busy day to complete the survey.

## 2020-10-20 ENCOUNTER — Encounter: Payer: Self-pay | Admitting: Speech Pathology

## 2020-10-20 ENCOUNTER — Ambulatory Visit: Payer: 59 | Admitting: Physical Therapy

## 2020-10-20 ENCOUNTER — Ambulatory Visit: Payer: 59 | Admitting: Occupational Therapy

## 2020-10-20 ENCOUNTER — Ambulatory Visit: Payer: 59 | Attending: Neurology | Admitting: Speech Pathology

## 2020-10-20 ENCOUNTER — Other Ambulatory Visit: Payer: Self-pay

## 2020-10-20 DIAGNOSIS — R29818 Other symptoms and signs involving the nervous system: Secondary | ICD-10-CM

## 2020-10-20 DIAGNOSIS — R2689 Other abnormalities of gait and mobility: Secondary | ICD-10-CM | POA: Insufficient documentation

## 2020-10-20 DIAGNOSIS — G20 Parkinson's disease: Secondary | ICD-10-CM | POA: Insufficient documentation

## 2020-10-20 NOTE — Therapy (Signed)
[facility] [address] [place], [place], [address] Phone: [phone]   Fax:  [phone]  Patient Details  Name: Chelsea Mercado MRN: [id number] Date of Birth: 1960-02-19 Referring Provider:  Ludwig Clarks, DO  Encounter Date: 10/20/2020   Occupational Therapy Parkinson's Disease Screen  Hand dominance:  right   Physical Performance Test item #4 (donning/doffing jacket):  12.37 sec  Fastening/unfastening 3 buttons in:  15.0 sec  9-hole peg test:    RUE  19.53 sec        LUE  23.0 sec  Box & Blocks Test:   RUE  70 blocks        LUE  72 blocks  Change in ability to perform ADLs/IADLs:  no  Other Comments:  noted L shoulder hike with coordination activities.  Briefly educated pt in importance of L shoulder positioning and ways to decr risk of shoulder pain/injury (including positioning with rowing, exercise, and fine motor tasks).  Pt does not require occupational therapy services at this time.  Recommended occupational therapy screen in   approx 6 months.   However, pt desires to wait and plan to schedule at [facility] location.  [facility] 10/20/2020, 8:13 AM  [facility] [address] [place], [place], [address] Phone: [phone]   Fax:  [place], OTR/L [facility] [address] [place], [place]  [address] [phone] phone [phone] 10/20/20 8:13 AM

## 2020-10-20 NOTE — Therapy (Signed)
[facility] [address] [place], [place], [address] Phone: [phone]   Fax:  [phone]  Patient Details  Name: Torry Adamczak MRN: [id number] Date of Birth: 11/13/1960 Referring Provider:  Ludwig Clarks, DO  Encounter Date: 10/20/2020   Speech Therapy Parkinson's Disease Screen   Decibel Level today: 68-72dB  (WNL=70-72 dB) with sound level meter 30cm away from pt's masked mouth. Pt's conversational volume has remained stable. Pt is reporting success in group conversations, phone calls and speaking in noisy environments.  Pt does not report difficulty with swallowing  and does not warrant further evaluation  Pt does does not require speech therapy services at this time. Recommend ST screen in another 6 months   Camie Hauss, Annye Rusk MS, CCC-SLP 10/20/2020, 8:19 AM  [facility] [address] [place], [place], [address] Phone: [phone]   Fax:  [phone]

## 2020-10-20 NOTE — Patient Instructions (Addendum)
    [place] [facility] [place]  Pay attention to anyone asking you repeat, leaning in, giving you the lean in  Difficulty with phone conversations  If you are feeling like you husband needs hearing aids  [facility] has a neuroclinic PD

## 2020-10-20 NOTE — Therapy (Signed)
[facility] [address] [place], [place], [address] Phone: [phone]   Fax:  [phone]  Patient Details  Name: Chelsea Mercado MRN: [id number] Date of Birth: November 22, 1960 Referring Provider:  Ludwig Clarks, DO  Encounter Date: 10/20/2020  Physical Therapy Parkinson's Disease Screen   Timed Up and Go test: 9.22 sec  10 meter walk test: 8.41 sec (3.9 ft/sec)  5 time sit to stand test: 8.97 sec   Patient does not require Physical Therapy services at this time.  Recommend Physical Therapy screen in 6-9 months.   Haelie Clapp W. 10/20/2020, 8:17 AM Frazier Butt., PT   [facility] [address] [place], [place], [address] Phone: [phone]   Fax:  [phone]

## 2020-10-29 ENCOUNTER — Other Ambulatory Visit: Payer: Self-pay

## 2020-10-29 ENCOUNTER — Ambulatory Visit
Admission: RE | Admit: 2020-10-29 | Discharge: 2020-10-29 | Disposition: A | Discharge status: Home / Self Care | Payer: 59 | Source: Ambulatory Visit | Attending: Neurology | Admitting: Neurology

## 2020-10-31 ENCOUNTER — Other Ambulatory Visit: Payer: Self-pay | Admitting: Internal Medicine

## 2020-11-07 ENCOUNTER — Ambulatory Visit: Payer: 59 | Admitting: Neurology

## 2020-11-10 DIAGNOSIS — M25562 Pain in left knee: Secondary | ICD-10-CM | POA: Insufficient documentation

## 2020-11-16 ENCOUNTER — Other Ambulatory Visit: Payer: Self-pay

## 2020-11-16 ENCOUNTER — Other Ambulatory Visit (INDEPENDENT_AMBULATORY_CARE_PROVIDER_SITE_OTHER): Payer: 59

## 2020-11-16 DIAGNOSIS — E039 Hypothyroidism, unspecified: Secondary | ICD-10-CM | POA: Diagnosis not present

## 2020-11-16 LAB — TSH: TSH: 2.09 u[IU]/mL (ref 0.35–5.50)

## 2020-11-16 LAB — T4, FREE: Free T4: 1.27 ng/dL (ref 0.60–1.60)

## 2020-11-20 ENCOUNTER — Other Ambulatory Visit: Payer: Self-pay | Admitting: Internal Medicine

## 2020-11-21 ENCOUNTER — Encounter: Payer: Self-pay | Admitting: Physician Assistant

## 2020-11-21 ENCOUNTER — Other Ambulatory Visit: Payer: Self-pay

## 2020-11-21 ENCOUNTER — Encounter: Payer: 59 | Admitting: Physician Assistant

## 2020-11-21 ENCOUNTER — Ambulatory Visit (INDEPENDENT_AMBULATORY_CARE_PROVIDER_SITE_OTHER): Payer: 59 | Admitting: Physician Assistant

## 2020-11-21 VITALS — BP 122/84 | HR 98 | Temp 97.4°F | Ht 66.0 in | Wt 184.4 lb

## 2020-11-21 DIAGNOSIS — Z23 Encounter for immunization: Secondary | ICD-10-CM | POA: Diagnosis not present

## 2020-11-21 DIAGNOSIS — Z1211 Encounter for screening for malignant neoplasm of colon: Secondary | ICD-10-CM | POA: Diagnosis not present

## 2020-11-21 DIAGNOSIS — G8929 Other chronic pain: Secondary | ICD-10-CM | POA: Diagnosis not present

## 2020-11-21 DIAGNOSIS — M25562 Pain in left knee: Secondary | ICD-10-CM | POA: Diagnosis not present

## 2020-11-21 DIAGNOSIS — I83893 Varicose veins of bilateral lower extremities with other complications: Secondary | ICD-10-CM | POA: Diagnosis not present

## 2020-11-21 DIAGNOSIS — L989 Disorder of the skin and subcutaneous tissue, unspecified: Secondary | ICD-10-CM

## 2020-11-21 DIAGNOSIS — B079 Viral wart, unspecified: Secondary | ICD-10-CM

## 2020-11-21 NOTE — Progress Notes (Signed)
Chelsea Mercado is a 60 y.o. female here for to establish care referred by former PCP, Dr. Rogers Blocker.   History of Present Illness:   Chief Complaint  Patient presents with   Establish Care    TOC-  c/o having a spot on her chest.  Wants flu shot today.  Not fasting today.     Acute Concerns: Varicose Veins  Chelsea Mercado expressed she was at one point interested in having what she calls, spider veins, removed from lower extremities. States she does have an itching sensation on the back of her legs, but other than that she has no issues with this condition. Due to other issues, Chelsea Mercado would like to hold off on having these removed until further notice.   Skin lesion Chelsea Mercado expresses concern for what can be percieved as a skin tag that she finds herself picking at. According to pt, she noticed this several months prior and is interested in having it removed today. Denies pain.   Left Knee Pain Back in July, Chelsea Mercado noticed while walking and running, she began experiencing swelling and pain in her left knee. She states it became so bad that she needed her son to help her up two steps in front of her home following her workout. Following this, Chelsea Mercado was seen at [facility] where she had x- rays performed that resulted as normal.  Following her visit to the UC, she decided to follow up with Dr. Amalia Hailey, Sports medicine, on 08/10/20. During this visit, it was found that [facility] had a degenerative medial meniscus tear and some resulting medical knee laxity and instability.   It was concluded that due to diabetes, she would not be a good candidate for steroid intervention. Due to this pt is currently using a stabilizing knee brace to manage instability. At this time she is interested in participating in PT and discussing more options as far as treatment goes for her knee.    Other providers/specialists: Patient Care Team: Inda Coke, [place] as PCP - General (Physician Assistant) Leonie Man, MD as PCP - Cardiology (Cardiology) [name], MD as Consulting Physician (Internal Medicine) Katy Apo, MD as Consulting Physician (Ophthalmology) Tat, Eustace Quail, DO as Consulting Physician (Neurology)   Past Medical History:  Diagnosis Date   Anemia    Arthritis    Controlled type 1 diabetes mellitus without complication ([facility]) 35/45/6256   Hyperlipidemia 06/22/2013   Obesity, Class I, BMI 30-34.9 06/22/2013   Parkinson disease ([facility])    PONV (postoperative nausea and vomiting)    Post menopausal syndrome 06/22/2013   Primary hypothyroidism 06/22/2013   Vitamin D deficiency 09/24/2013     Social History   Tobacco Use   Smoking status: Never   Smokeless tobacco: Never  Vaping Use   Vaping Use: Never used  Substance Use Topics   Alcohol use: No   Drug use: No    Past Surgical History:  Procedure Laterality Date   APPENDECTOMY     BREAST EXCISIONAL BIOPSY Left    papilloma   BREAST LUMPECTOMY WITH RADIOACTIVE SEED LOCALIZATION Left 06/10/2018   Procedure: LEFT BREAST LUMPECTOMY WITH RADIOACTIVE SEED LOCALIZATION;  Surgeon: Erroll Luna, MD;  Location: [facility];  Service: General;  Laterality: Left;   BREAST LUMPECTOMY WITH RADIOACTIVE SEED LOCALIZATION Right 04/21/2020   Procedure: RIGHT BREAST LUMPECTOMY WITH RADIOACTIVE SEED LOCALIZATION;  Surgeon: Erroll Luna, MD;  Location: [facility];  Service: General;  Laterality: Right;   CESAREAN SECTION  1985   TONSILLECTOMY     torn menisicus Left     Family History  Problem Relation Age of Onset   Hypertension Mother    High Cholesterol Mother    Coronary artery disease Mother 20       CABG   Tuberculosis Father    Heart attack Maternal Grandfather        Died from MI   Coronary artery disease Maternal Uncle 75       CABG   Lung cancer Maternal Uncle        asbestosis   CAD Maternal Uncle 14       died - coronary aneurysm   Hyperlipidemia Sister    Hypertension  Sister    Hypothyroidism Sister    Tuberculosis Paternal Grandmother    Cancer Paternal Grandfather    Hypothyroidism Sister     Allergies  Allergen Reactions   Hydrocodone-Acetaminophen Nausea And Vomiting   Phenergan [Promethazine]     Diagnosed with parkinsons, instructed by neurologist not to take phenergan    Propoxyphene Nausea Only   Tape Rash     Current Medications:   Current Outpatient Medications:    ACCU-CHEK GUIDE test strip, USE AS INSTRUCTED 3-4 X A DAY, Disp: 400 strip, Rfl: 1   Accu-Chek Softclix Lancets lancets, Use as instructed to check blood sugar 4 times daily, Disp: 400 each, Rfl: 3   Blood Glucose Monitoring Suppl (ACCU-CHEK GUIDE) w/Device KIT, AccuChek Guide LINK - the only meter approved to communicate with her Medtronic 770G insulin pump, Disp: 1 kit, Rfl: 0   carbidopa-levodopa (SINEMET IR) 25-100 MG tablet, TAKE 1 TABLET BY MOUTH 3 TIMES DAILY. 7AM/11AM/4PM, Disp: 270 tablet, Rfl: 1   Cholecalciferol (VITAMIN D3) 5000 units CAPS, Take 1 capsule by mouth daily. , Disp: , Rfl:    Glucagon 3 MG/DOSE POWD, Place 3 mg into the nose once as needed for up to 1 dose., Disp: 1 each, Rfl: 11   HUMALOG 100 UNIT/ML injection, USE AS DIRECTED PER PUMP TAKE MAXIMUM 50 UNITS DAILY, Disp: 50 mL, Rfl: 1   ibuprofen (ADVIL) 800 MG tablet, Take 1 tablet (800 mg total) by mouth every 8 (eight) hours as needed., Disp: 30 tablet, Rfl: 0   Insulin Syringe-Needle U-100 (BD INSULIN SYRINGE U/F) 31G X 5/16" 0.3 ML MISC, Use 4x a day as advised, Disp: 300 each, Rfl: 3   levothyroxine (SYNTHROID) 112 MCG tablet, Take 1 tablet (112 mcg total) by mouth daily., Disp: 45 tablet, Rfl: 3   naproxen (NAPROSYN) 500 MG tablet, Take 1 tablet (500 mg total) by mouth 2 (two) times daily with a meal., Disp: 60 tablet, Rfl: 1   pravastatin (PRAVACHOL) 40 MG tablet, TAKE 1 TABLET BY MOUTH EVERY DAY, Disp: 90 tablet, Rfl: 3   ramipril (ALTACE) 10 MG capsule, TAKE 1 CAPSULE BY MOUTH EVERY DAY,  Disp: 90 capsule, Rfl: 3   vitamin B-12 (CYANOCOBALAMIN) 500 MCG tablet, Take 1,000 mcg by mouth daily., Disp: , Rfl:    azelastine (ASTELIN) 0.1 % nasal spray, Place 2 sprays into both nostrils 2 (two) times daily. Use in each nostril as directed (Patient not taking: Reported on 11/21/2020), Disp: 30 mL, Rfl: 12   Review of Systems:   ROS Negative unless otherwise specified per HPI.  Vitals:   Vitals:   11/21/20 0854  BP: 122/84  Pulse: 98  Temp: (!) 97.4 F (36.3 C)  TempSrc: Temporal  SpO2: 99%  Weight: 184 lb 6.4 oz (83.6 kg)  Height: 5' 6"  (1.676 m)      Body mass index is 29.76 kg/m.  Physical Exam:   Physical Exam Vitals and nursing note reviewed.  Constitutional:      General: She is not in acute distress.    Appearance: She is well-developed. She is not ill-appearing or toxic-appearing.  Cardiovascular:     Rate and Rhythm: Normal rate and regular rhythm.     Pulses: Normal pulses.     Heart sounds: Normal heart sounds, S1 normal and S2 normal.  Pulmonary:     Effort: Pulmonary effort is normal.     Breath sounds: Normal breath sounds.  Musculoskeletal:     Comments: Lower extremities with spider veins and varicose veins throughout  L knee with normal ROM but slight medial swelling; no TTP  Skin:    General: Skin is warm and dry.     Comments: Approximately 4 mm cutaneous skin lesion with surrounding erythema    Neurological:     Mental Status: She is alert.     GCS: GCS eye subscore is 4. GCS verbal subscore is 5. GCS motor subscore is 6.  Psychiatric:        Speech: Speech normal.        Behavior: Behavior normal. Behavior is cooperative.   Shave biopsy Meds, vitals, and allergies reviewed.  Indication: suspicious lesion Location: center of upper chest Size: 63m Informed consent obtained.  Pt aware of risks not limited to but including infection,  bleeding, damage to near by organs. Prep: etoh/betadine Anesthesia: 1%lidocaine with epi, good  effect Shave made with dermablade Minimal oozing, controlled with silver nitrate Tolerated well  Assessment and Plan:   Varicose veins of bilateral lower extremities with other complications Mgmt per vascular, denies acute concerns  Skin lesion Lesion removed without difficulty or complications Follow-up with derm path results and provide recommendations accordingly After care information provided  Chronic pain of left knee Management per sports medicine and PT, denies acute needs  Special screening for malignant neoplasms, colon Cologuard ordered  Need for influenza vaccination Completed today   I,Havlyn C Ratchford,acting as a scribe for [employer] PA.,have documented all relevant documentation on the behalf of SInda Coke PA,as directed by  SInda Coke PA while in the presence of SInda Coke PUtah  I, SInda Coke PUtah have reviewed all documentation for this visit. The documentation on 11/21/20 for the exam, diagnosis, procedures, and orders are all accurate and complete.  SInda Coke PA-C

## 2020-11-21 NOTE — Patient Instructions (Signed)
It was great to see you!  I will be in touch with your pathology report  Please schedule a physical at your convenience  Take care,  Inda Coke PA-C

## 2020-12-05 ENCOUNTER — Encounter: Payer: Self-pay | Admitting: Physician Assistant

## 2020-12-05 ENCOUNTER — Other Ambulatory Visit: Payer: Self-pay

## 2020-12-05 ENCOUNTER — Ambulatory Visit (INDEPENDENT_AMBULATORY_CARE_PROVIDER_SITE_OTHER): Payer: 59 | Admitting: Physician Assistant

## 2020-12-05 VITALS — BP 130/78 | HR 78 | Temp 98.4°F | Ht 66.0 in | Wt 188.2 lb

## 2020-12-05 DIAGNOSIS — Z0000 Encounter for general adult medical examination without abnormal findings: Secondary | ICD-10-CM | POA: Diagnosis not present

## 2020-12-05 DIAGNOSIS — E1069 Type 1 diabetes mellitus with other specified complication: Secondary | ICD-10-CM | POA: Diagnosis not present

## 2020-12-05 DIAGNOSIS — E559 Vitamin D deficiency, unspecified: Secondary | ICD-10-CM

## 2020-12-05 DIAGNOSIS — E1065 Type 1 diabetes mellitus with hyperglycemia: Secondary | ICD-10-CM | POA: Diagnosis not present

## 2020-12-05 DIAGNOSIS — E782 Mixed hyperlipidemia: Secondary | ICD-10-CM

## 2020-12-05 DIAGNOSIS — E039 Hypothyroidism, unspecified: Secondary | ICD-10-CM

## 2020-12-05 LAB — CBC WITH DIFFERENTIAL/PLATELET
Basophils Absolute: 0.1 10*3/uL (ref 0.0–0.1)
Basophils Relative: 1.1 % (ref 0.0–3.0)
Eosinophils Absolute: 0.3 10*3/uL (ref 0.0–0.7)
Eosinophils Relative: 4 % (ref 0.0–5.0)
HCT: 38.7 % (ref 36.0–46.0)
Hemoglobin: 13.1 g/dL (ref 12.0–15.0)
Lymphocytes Relative: 25.8 % (ref 12.0–46.0)
Lymphs Abs: 1.7 10*3/uL (ref 0.7–4.0)
MCHC: 33.7 g/dL (ref 30.0–36.0)
MCV: 90.2 fl (ref 78.0–100.0)
Monocytes Absolute: 0.4 10*3/uL (ref 0.1–1.0)
Monocytes Relative: 6 % (ref 3.0–12.0)
Neutro Abs: 4.1 10*3/uL (ref 1.4–7.7)
Neutrophils Relative %: 63.1 % (ref 43.0–77.0)
Platelets: 247 10*3/uL (ref 150.0–400.0)
RBC: 4.29 Mil/uL (ref 3.87–5.11)
RDW: 13.1 % (ref 11.5–15.5)
WBC: 6.6 10*3/uL (ref 4.0–10.5)

## 2020-12-05 LAB — VITAMIN D 25 HYDROXY (VIT D DEFICIENCY, FRACTURES): VITD: 57.23 ng/mL (ref 30.00–100.00)

## 2020-12-05 NOTE — Patient Instructions (Signed)
It was great to see you!  We are going to order a CT scan for around your heart (see attached handout -- Coronary Calcium Scan). You will be contacted about this.  Please go to the lab for blood work.   Our office will call you with your results unless you have chosen to receive results via MyChart.  If your blood work is normal we will follow-up each year for physicals and as scheduled for chronic medical problems.  If anything is abnormal we will treat accordingly and get you in for a follow-up.  Take care,  Aldona Bar

## 2020-12-05 NOTE — Progress Notes (Signed)
Subjective:    Chelsea Mercado is a 60 y.o. female and is here for a comprehensive physical exam.   HPI  Health Maintenance Due  Topic Date Due   Zoster Vaccines- Shingrix (1 of 2) Never done   Pneumococcal Vaccine 68-16 Years old (2 - PCV) 11/19/2017   COVID-19 Vaccine (3 - Moderna risk series) 07/01/2019   COLONOSCOPY (Pts 45-61yrs Insurance coverage will need to be confirmed)  10/10/2020   FOOT EXAM  11/18/2020   Chronic Issues: Obesity Chelsea Mercado expresses she has been having trouble with keeping weight off. She states she has been adjusting her diet by making healthier choices and has been participating in regular exercise.   Diabetes Mellitus  Chelsea Mercado is compliant with taking humalog 50 units injection daily. According to [facility], she is able to notice when her blood sugar drops below 60 due to hyperglycemic symptoms. This is currently managed by Dr. [name] and she is managing well. Denies: hypoglycemic or hyperglycemic episodes or symptoms.   Lab Results  Component Value Date   HGBA1C 7.8 (A) 09/28/2020   HLD  Currently compliant with taking pravachol 40 mg with no adverse effects. She is managing well.   Hypothyroidism Chelsea Mercado is compliant with taking synthroid 112 mcg with no adverse effects. Denies brittle nails, cold/heat intolerance, hair loss or heart palpitations. She is managing well.   Vit D deficiency Would like her Vit D checked today. Takes 5000 IU daily.    Health Maintenance: Immunizations -- Covid- UTD- 2021 Influenza- UTD- 2022 Tdap- UTD- 01/08/13 Colonoscopy -- UTD via Cologuard- 11/21/20 Mammogram -- UTD- 10/26/19 PAP -- UTD- 12/31/18 Diabetic Eye Exam- UTD Bone Density -- N/A Diet -- Eating all food groups; improving healthy choices Sleep habits -- Normal schedule  Exercise -- Walking regularly  Weight -- Fluctuates about 3 lbs often Mood -- Stable Weight history: Wt Readings from Last 10 Encounters:  12/05/20 188 lb 4 oz (85.4 kg)   11/21/20 184 lb 6.4 oz (83.6 kg)  10/11/20 185 lb 12.8 oz (84.3 kg)  09/28/20 185 lb (83.9 kg)  08/17/20 185 lb 9.6 oz (84.2 kg)  08/10/20 187 lb 12.8 oz (85.2 kg)  05/27/20 183 lb 6.4 oz (83.2 kg)  05/20/20 187 lb 12.8 oz (85.2 kg)  04/21/20 188 lb 11.4 oz (85.6 kg)  03/18/20 187 lb (84.8 kg)   Body mass index is 30.38 kg/m. No LMP recorded. Patient is postmenopausal. Alcohol use:  reports no history of alcohol use. Tobacco use: Never Tobacco Use: Low Risk    Smoking Tobacco Use: Never   Smokeless Tobacco Use: Never   Passive Exposure: Not on file     Depression screen [facility] 2/9 11/21/2020  Decreased Interest 0  Down, Depressed, Hopeless 0  PHQ - 2 Score 0  Altered sleeping -  Tired, decreased energy -  Change in appetite -  Feeling bad or failure about yourself  -  Trouble concentrating -  Moving slowly or fidgety/restless -  Suicidal thoughts -  PHQ-9 Score -  Difficult doing work/chores -     Other providers/specialists: Patient Care Team: Inda Coke, [place] as PCP - General (Physician Assistant) Leonie Man, MD as PCP - Cardiology (Cardiology) [name], MD as Consulting Physician (Internal Medicine) Katy Apo, MD as Consulting Physician (Ophthalmology) Tat, Eustace Quail, DO as Consulting Physician (Neurology)    PMHx, SurgHx, SocialHx, Medications, and Allergies were reviewed in the Visit Navigator and updated as appropriate.   Past Medical History:  Diagnosis Date  Anemia    Arthritis    Controlled type 1 diabetes mellitus without complication ([place]) 56/43/3295   Hyperlipidemia 06/22/2013   Obesity, Class I, BMI 30-34.9 06/22/2013   Parkinson disease ([facility])    PONV (postoperative nausea and vomiting)    Post menopausal syndrome 06/22/2013   Primary hypothyroidism 06/22/2013   Vitamin D deficiency 09/24/2013     Past Surgical History:  Procedure Laterality Date   APPENDECTOMY     BREAST EXCISIONAL BIOPSY Left    papilloma    BREAST LUMPECTOMY WITH RADIOACTIVE SEED LOCALIZATION Left 06/10/2018   Procedure: LEFT BREAST LUMPECTOMY WITH RADIOACTIVE SEED LOCALIZATION;  Surgeon: Erroll Luna, MD;  Location: [facility];  Service: General;  Laterality: Left;   BREAST LUMPECTOMY WITH RADIOACTIVE SEED LOCALIZATION Right 04/21/2020   Procedure: RIGHT BREAST LUMPECTOMY WITH RADIOACTIVE SEED LOCALIZATION;  Surgeon: Erroll Luna, MD;  Location: [facility];  Service: General;  Laterality: Right;   [place]     torn menisicus Left      Family History  Problem Relation Age of Onset   Hypertension Mother    High Cholesterol Mother    Coronary artery disease Mother 55       CABG   Tuberculosis Father    Heart attack Maternal Grandfather        Died from MI   Coronary artery disease Maternal Uncle 65       CABG   Lung cancer Maternal Uncle        asbestosis   CAD Maternal Uncle 81       died - coronary aneurysm   Hyperlipidemia Sister    Hypertension Sister    Hypothyroidism Sister    Tuberculosis Paternal Grandmother    Cancer Paternal Grandfather    Hypothyroidism Sister     Social History   Tobacco Use   Smoking status: Never   Smokeless tobacco: Never  Vaping Use   Vaping Use: Never used  Substance Use Topics   Alcohol use: No   Drug use: No    Review of Systems:   Review of Systems  Constitutional:  Negative for chills, fever, malaise/fatigue and weight loss.  HENT:  Negative for hearing loss, sinus pain and sore throat.   Respiratory:  Negative for cough and hemoptysis.   Cardiovascular:  Negative for chest pain, palpitations, leg swelling and PND.  Gastrointestinal:  Negative for abdominal pain, constipation, diarrhea, heartburn, nausea and vomiting.  Genitourinary:  Negative for dysuria, frequency and urgency.  Musculoskeletal:  Negative for back pain, myalgias and neck pain.  Skin:  Negative for itching and rash.  Neurological:   Negative for dizziness, tingling, seizures and headaches.  Endo/Heme/Allergies:  Negative for polydipsia.  Psychiatric/Behavioral:  Negative for depression. The patient is not nervous/anxious.    Objective:   BP 130/78 (BP Location: Left Arm, Patient Position: Sitting, Cuff Size: Normal)   Pulse 78   Temp 98.4 F (36.9 C) (Temporal)   Ht 5\' 6"  (1.676 m)   Wt 188 lb 4 oz (85.4 kg)   SpO2 97%   BMI 30.38 kg/m  Body mass index is 30.38 kg/m.   General Appearance:    Alert, cooperative, no distress, appears stated age  Head:    Normocephalic, without obvious abnormality, atraumatic  Eyes:    PERRL, conjunctiva/corneas clear, EOM's intact, fundi    benign, both eyes  Ears:    Normal TM's and external ear canals, both ears  Nose:  Nares normal, septum midline, mucosa normal, no drainage    or sinus tenderness  Throat:   Lips, mucosa, and tongue normal; teeth and gums normal  Neck:   Supple, symmetrical, trachea midline, no adenopathy;    thyroid:  no enlargement/tenderness/nodules; no carotid   bruit or JVD  Back:     Symmetric, no curvature, ROM normal, no CVA tenderness  Lungs:     Clear to auscultation bilaterally, respirations unlabored  Chest Wall:    No tenderness or deformity   Heart:    Regular rate and rhythm, S1 and S2 normal, no murmur, rub or gallop  Breast Exam:    Deferred  Abdomen:     Soft, non-tender, bowel sounds active all four quadrants,    no masses, no organomegaly  Genitalia:    Deferred  Extremities:   Extremities normal, atraumatic, no cyanosis or edema  Pulses:   2+ and symmetric all extremities  Skin:   Skin color, texture, turgor normal, no rashes or lesions  Lymph nodes:   Cervical, supraclavicular, and axillary nodes normal  Neurologic:   CNII-XII intact, normal strength, sensation and reflexes    throughout   Diabetic Foot Exam - Simple   Simple Foot Form Diabetic Foot exam was performed with the following findings: Yes 12/05/2020 11:20 AM   Visual Inspection No deformities, no ulcerations, no other skin breakdown bilaterally: Yes Sensation Testing Intact to touch and monofilament testing bilaterally: Yes Pulse Check Posterior Tibialis and Dorsalis pulse intact bilaterally: Yes Comments      Assessment/Plan:   Routine Physical Examination Today patient counseled on age appropriate routine health concerns for screening and prevention, each reviewed and up to date or declined. Immunizations reviewed and up to date or declined. Labs ordered and reviewed. Risk factors for depression reviewed and negative. Hearing function and visual acuity are intact. ADLs screened and addressed as needed. Functional ability and level of safety reviewed and appropriate. Education, counseling and referrals performed based on assessed risks today. Patient provided with a copy of personalized plan for preventive services.  Hypothyroidism, unspecified cause Continue levothyroxine 112 mcg daily Currently being managed by Dr. Cruzita Lederer, Internal medicine  Type 1 Diabetes Mellitus with hyperglycemia ([place]) Controlled  Currently being managed by Dr. Cruzita Lederer, Internal medicine   Hyperlipidemia, unspecified cause  Due to family history and current DM status, I have recommended increasing statin to max dose for goal of LDL <100. She is reluctant to do this. She is agreeable to Calcium CT score for further evaluation and we will use this information to determine how to maximize therapy and reduce risk of MI.  Vitamin D Deficiency Update blood work today and provide recommendations as indicated by lab results   Patient Counseling: [x]    Nutrition: Stressed importance of moderation in sodium/caffeine intake, saturated fat and cholesterol, caloric balance, sufficient intake of fresh fruits, vegetables, fiber, calcium, iron, and 1 mg of folate supplement per day (for females capable of pregnancy).  [x]    Stressed the importance of regular exercise.   [x]     Substance Abuse: Discussed cessation/primary prevention of tobacco, alcohol, or other drug use; driving or other dangerous activities under the influence; availability of treatment for abuse.   [x]    Injury prevention: Discussed safety belts, safety helmets, smoke detector, smoking near bedding or upholstery.   [x]    Sexuality: Discussed sexually transmitted diseases, partner selection, use of condoms, avoidance of unintended pregnancy  and contraceptive alternatives.  [x]    Dental health: Discussed importance of regular  tooth brushing, flossing, and dental visits.  [x]    Health maintenance and immunizations reviewed. Please refer to Health maintenance section.   I,Havlyn C Ratchford,acting as a Education administrator for [employer], PA.,have documented all relevant documentation on the behalf of Inda Coke, PA,as directed by  Inda Coke, PA while in the presence of Inda Coke, [place].  I, Inda Coke, [place], have reviewed all documentation for this visit. The documentation on 12/05/20 for the exam, diagnosis, procedures, and orders are all accurate and complete.   Inda Coke, PA-C [place]

## 2020-12-09 LAB — COLOGUARD: COLOGUARD: NEGATIVE

## 2020-12-21 ENCOUNTER — Other Ambulatory Visit: Payer: Self-pay | Admitting: Internal Medicine

## 2020-12-29 ENCOUNTER — Ambulatory Visit (INDEPENDENT_AMBULATORY_CARE_PROVIDER_SITE_OTHER)
Admission: RE | Admit: 2020-12-29 | Discharge: 2020-12-29 | Disposition: A | Discharge status: Home / Self Care | Payer: Self-pay | Source: Ambulatory Visit | Attending: Physician Assistant | Admitting: Physician Assistant

## 2020-12-29 ENCOUNTER — Other Ambulatory Visit: Payer: Self-pay

## 2020-12-29 DIAGNOSIS — E782 Mixed hyperlipidemia: Secondary | ICD-10-CM

## 2020-12-29 DIAGNOSIS — E1069 Type 1 diabetes mellitus with other specified complication: Secondary | ICD-10-CM

## 2021-01-02 ENCOUNTER — Other Ambulatory Visit: Payer: Self-pay | Admitting: Physician Assistant

## 2021-01-03 ENCOUNTER — Encounter: Payer: Self-pay | Admitting: Physician Assistant

## 2021-01-03 ENCOUNTER — Ambulatory Visit (INDEPENDENT_AMBULATORY_CARE_PROVIDER_SITE_OTHER): Payer: 59 | Admitting: Physician Assistant

## 2021-01-03 ENCOUNTER — Other Ambulatory Visit: Payer: Self-pay

## 2021-01-03 ENCOUNTER — Other Ambulatory Visit: Payer: Self-pay | Admitting: Physician Assistant

## 2021-01-03 VITALS — BP 118/58 | HR 84 | Temp 98.6°F | Ht 66.0 in | Wt 187.4 lb

## 2021-01-03 DIAGNOSIS — E1069 Type 1 diabetes mellitus with other specified complication: Secondary | ICD-10-CM

## 2021-01-03 DIAGNOSIS — E782 Mixed hyperlipidemia: Secondary | ICD-10-CM

## 2021-01-03 DIAGNOSIS — R040 Epistaxis: Secondary | ICD-10-CM

## 2021-01-03 MED ORDER — PRAVASTATIN SODIUM 80 MG PO TABS: 80.0000 mg | ORAL_TABLET | Freq: Every day | ORAL | 3 refills | Status: DC

## 2021-01-03 NOTE — Patient Instructions (Addendum)
It was great to see you!  Referral for ENT and dietitian  Start nasal saline spray (i.e., Simply Saline)   Consider cool-mist humidifier at night  I'm sending in higher dose of pravastatin 80 mg daily for you  Take care,  Inda Coke PA-C

## 2021-01-03 NOTE — Progress Notes (Signed)
Chelsea Mercado is a 60 y.o. female here for recurring epistaxis.   History of Present Illness:   Chief Complaint  Patient presents with   Epistaxis    Pt c/o nose bleeds, most recent was last week and lasted 15 mins and passed two blood clots per pt. Has seen ENT in the past two years   Epistaxis Chelsea Mercado is still experiencing nosebleeds in her right nostril and is concerned it could be something more. States her most recent nosebleed occurred last week in the early morning while she was sleeping. Reports she felt liquid running down her throat and something coming out of her nose. This episode lasted longer than previous episodes, about 15 minutes and including two blood clots. Chelsea Mercado has visited Dr. Lucia Gaskins, ENT about this issue about two years ago and was told it could be a scab coming off a vessel before reforming. Admits that at the time she was offered to have this cauterized but due to being uncomfortable with the provider, she declined.   HLD Currently pt is compliant with taking pravastatin 40 mg daily with no adverse effects. She is managing well and will need a refill of this medication. We recently resulted her coronary calcium scoring --> Calcium scoring shows score of 13 which is 73rd percentile for her age/sex/race- matched controls. She has taken lipitor in the past but it caused muscle aches.  Past Medical History:  Diagnosis Date   Anemia    Arthritis    Controlled type 1 diabetes mellitus without complication ([place]) 54/49/2010   Hyperlipidemia 06/22/2013   Obesity, Class I, BMI 30-34.9 06/22/2013   Parkinson disease (HCC)    PONV (postoperative nausea and vomiting)    Post menopausal syndrome 06/22/2013   Primary hypothyroidism 06/22/2013   Vitamin D deficiency 09/24/2013     Social History   Tobacco Use   Smoking status: Never   Smokeless tobacco: Never  Vaping Use   Vaping Use: Never used  Substance Use Topics   Alcohol use: No   Drug use: No     Past Surgical History:  Procedure Laterality Date   APPENDECTOMY     BREAST EXCISIONAL BIOPSY Left    papilloma   BREAST LUMPECTOMY WITH RADIOACTIVE SEED LOCALIZATION Left 06/10/2018   Procedure: LEFT BREAST LUMPECTOMY WITH RADIOACTIVE SEED LOCALIZATION;  Surgeon: Erroll Luna, MD;  Location: [facility];  Service: General;  Laterality: Left;   BREAST LUMPECTOMY WITH RADIOACTIVE SEED LOCALIZATION Right 04/21/2020   Procedure: RIGHT BREAST LUMPECTOMY WITH RADIOACTIVE SEED LOCALIZATION;  Surgeon: Erroll Luna, MD;  Location: Home;  Service: General;  Laterality: Right;   CESAREAN SECTION  1985   TONSILLECTOMY     torn menisicus Left     Family History  Problem Relation Age of Onset   Hypertension Mother    High Cholesterol Mother    Coronary artery disease Mother 32       CABG   Tuberculosis Father    Heart attack Maternal Grandfather        Died from MI   Coronary artery disease Maternal Uncle 71       CABG   Lung cancer Maternal Uncle        asbestosis   CAD Maternal Uncle 33       died - coronary aneurysm   Hyperlipidemia Sister    Hypertension Sister    Hypothyroidism Sister    Tuberculosis Paternal Grandmother    Cancer Paternal Grandfather    Hypothyroidism  Sister     Allergies  Allergen Reactions   Hydrocodone-Acetaminophen Nausea And Vomiting   Phenergan [Promethazine]     Diagnosed with parkinsons, instructed by neurologist not to take phenergan    Propoxyphene Nausea Only   Tape Rash    Current Medications:   Current Outpatient Medications:    ACCU-CHEK GUIDE test strip, USE AS INSTRUCTED 3-4 X A DAY, Disp: 400 strip, Rfl: 1   Accu-Chek Softclix Lancets lancets, Use as instructed to check blood sugar 4 times daily, Disp: 400 each, Rfl: 3   aspirin 81 MG chewable tablet, Chew 81 mg by mouth daily., Disp: , Rfl:    Blood Glucose Monitoring Suppl (ACCU-CHEK GUIDE) w/Device KIT,  AccuChek Guide LINK - the only meter approved to communicate with her Medtronic 770G insulin pump, Disp: 1 kit, Rfl: 0   carbidopa-levodopa (SINEMET IR) 25-100 MG tablet, TAKE 1 TABLET BY MOUTH 3 TIMES DAILY. 7AM/11AM/4PM, Disp: 270 tablet, Rfl: 1   Cholecalciferol (VITAMIN D3) 5000 units CAPS, Take 1 capsule by mouth daily. , Disp: , Rfl:    fexofenadine (ALLEGRA) 180 MG tablet, Take 180 mg by mouth daily. Taking 1 daily, Disp: , Rfl:    Glucagon 3 MG/DOSE POWD, Place 3 mg into the nose once as needed for up to 1 dose., Disp: 1 each, Rfl: 11   HUMALOG 100 UNIT/ML injection, USE AS DIRECTED PER PUMP TAKE MAXIMUM 50 UNITS DAILY, Disp: 50 mL, Rfl: 1   ibuprofen (ADVIL) 800 MG tablet, Take 1 tablet (800 mg total) by mouth every 8 (eight) hours as needed., Disp: 30 tablet, Rfl: 0   Insulin Syringe-Needle U-100 (BD INSULIN SYRINGE U/F) 31G X 5/16" 0.3 ML MISC, Use 4x a day as advised, Disp: 300 each, Rfl: 3   levothyroxine (SYNTHROID) 112 MCG tablet, Take 1 tablet (112 mcg total) by mouth daily., Disp: 45 tablet, Rfl: 3   levothyroxine (SYNTHROID) 112 MCG tablet, Take 1 tablet (112 mcg total) by mouth daily., Disp: 45 tablet, Rfl: 3   naproxen (NAPROSYN) 500 MG tablet, Take 1 tablet (500 mg total) by mouth 2 (two) times daily with a meal., Disp: 60 tablet, Rfl: 1   pravastatin (PRAVACHOL) 40 MG tablet, TAKE 1 TABLET BY MOUTH EVERY DAY, Disp: 90 tablet, Rfl: 3   ramipril (ALTACE) 10 MG capsule, TAKE 1 CAPSULE BY MOUTH EVERY DAY, Disp: 90 capsule, Rfl: 3   vitamin B-12 (CYANOCOBALAMIN) 500 MCG tablet, Take 1,000 mcg by mouth daily., Disp: , Rfl:    azelastine (ASTELIN) 0.1 % nasal spray, Place 2 sprays into both nostrils 2 (two) times daily. Use in each nostril as directed (Patient not taking: Reported on 01/03/2021), Disp: 30 mL, Rfl: 12   Review of Systems:   Review of Systems  HENT:  Positive for nosebleeds.   Negative unless otherwise specified per HPI. Vitals:   Vitals:   01/03/21  1507  Height: 5' 6"  (1.676 m)     Body mass index is 30.38 kg/m.  Physical Exam:   Physical Exam Vitals and nursing note reviewed.  Constitutional:      General: She is not in acute distress.    Appearance: She is well-developed. She is not ill-appearing or toxic-appearing.  HENT:     Head: Normocephalic and atraumatic.     Right Ear: Tympanic membrane, ear canal and external ear normal. Tympanic membrane is not erythematous, retracted or bulging.     Left Ear: Tympanic membrane, ear canal and external ear normal. Tympanic membrane is not erythematous,  retracted or bulging.     Nose: Nose normal.     Right Sinus: No maxillary sinus tenderness or frontal sinus tenderness.     Left Sinus: No maxillary sinus tenderness or frontal sinus tenderness.     Mouth/Throat:     Pharynx: Uvula midline. No posterior oropharyngeal erythema.  Eyes:     General: Lids are normal.     Conjunctiva/sclera: Conjunctivae normal.  Neck:     Trachea: Trachea normal.  Cardiovascular:     Rate and Rhythm: Normal rate and regular rhythm.     Heart sounds: Normal heart sounds, S1 normal and S2 normal.  Pulmonary:     Effort: Pulmonary effort is normal.     Breath sounds: Normal breath sounds. No decreased breath sounds, wheezing, rhonchi or rales.  Lymphadenopathy:     Cervical: No cervical adenopathy.  Skin:    General: Skin is warm and dry.  Neurological:     Mental Status: She is alert.  Psychiatric:        Speech: Speech normal.        Behavior: Behavior normal. Behavior is cooperative.    Assessment and Plan:   Epistaxis Start nasal saline spray such as Simply Saline nightly  Informed patient to consider using a cool mist humidifier at night  Will send referral to ENT  Hyperlipidemia, unspecified cause Increase pravastatin to 80 mg daily  Follow-up in 1 year   I,Havlyn C Ratchford,acting as a scribe for [employer], PA.,have documented all relevant documentation on the behalf of  Inda Coke, PA,as directed by  Inda Coke, PA while in the presence of Inda Coke, [place].  I, Inda Coke, [place], have reviewed all documentation for this visit. The documentation on 01/03/21 for the exam, diagnosis, procedures, and orders are all accurate and complete.   Inda Coke, PA-C

## 2021-01-09 ENCOUNTER — Other Ambulatory Visit: Payer: Self-pay | Admitting: Internal Medicine

## 2021-01-15 ENCOUNTER — Other Ambulatory Visit: Payer: Self-pay | Admitting: Internal Medicine

## 2021-01-30 ENCOUNTER — Ambulatory Visit: Payer: 59 | Admitting: Internal Medicine

## 2021-02-01 ENCOUNTER — Ambulatory Visit: Payer: 59 | Admitting: Internal Medicine

## 2021-02-01 ENCOUNTER — Encounter: Payer: Self-pay | Admitting: Physician Assistant

## 2021-02-01 ENCOUNTER — Ambulatory Visit: Payer: 59 | Admitting: Physician Assistant

## 2021-02-01 ENCOUNTER — Other Ambulatory Visit: Payer: Self-pay

## 2021-02-01 VITALS — BP 122/70 | HR 81 | Temp 97.8°F | Ht 66.0 in | Wt 185.5 lb

## 2021-02-01 DIAGNOSIS — J3489 Other specified disorders of nose and nasal sinuses: Secondary | ICD-10-CM

## 2021-02-01 DIAGNOSIS — E1065 Type 1 diabetes mellitus with hyperglycemia: Secondary | ICD-10-CM | POA: Diagnosis not present

## 2021-02-01 LAB — POC COVID19 BINAXNOW: SARS Coronavirus 2 Ag: NEGATIVE

## 2021-02-01 NOTE — Patient Instructions (Signed)
It was great to see you!  Referral to ENT was sent here:  [facility], [place] [address] Phone [phone] Fax [phone]     Please call them to schedule an appointment since you haven't heard anything yet   You have a viral upper respiratory infection. Antibiotics are not needed for this.  Viral infections usually take 7-10 days to resolve.  The cough can last a few weeks to go away.  Push fluids and get plenty of rest. Please return if you are not improving as expected, or if you have high fevers (>101.5) or difficulty swallowing or worsening productive cough.  Call clinic with questions.  I hope you start feeling better soon!     Inda Coke PA-C

## 2021-02-01 NOTE — Progress Notes (Signed)
Chelsea Mercado is a 61 y.o. female here for sinus pressure.  History of Present Illness:   Chief Complaint  Patient presents with   Sinus Problem    Pt c/o sinus pressure, achy above eyes, post nasal drip, bilateral ear pain, dry non-productive cough started yesterday.   Sinus Pressure [name] presents with c/o sinus pressure, post nasal drip, and bilateral ear pain which has been onset since yesterday. Associated sx is a dry non-productive cough and achy sensation above eyes. Upon initial sx, she did try using otc nasal saline spray but found that exacerbated her sx. She does admit that yesterday she did not take her allegra 180 mg but did so this morning and has found minor relief in terms of post nasal drip.   Denies: Fever, chills, CP or SOB  Took an at home covid test and this was negative   Past Medical History:  Diagnosis Date   Anemia    Arthritis    Controlled type 1 diabetes mellitus without complication ([place]) 62/94/7654   Hyperlipidemia 06/22/2013   Obesity, Class I, BMI 30-34.9 06/22/2013   Parkinson disease (HCC)    PONV (postoperative nausea and vomiting)    Post menopausal syndrome 06/22/2013   Primary hypothyroidism 06/22/2013   Vitamin D deficiency 09/24/2013     Social History   Tobacco Use   Smoking status: Never   Smokeless tobacco: Never  Vaping Use   Vaping Use: Never used  Substance Use Topics   Alcohol use: No   Drug use: No    Past Surgical History:  Procedure Laterality Date   APPENDECTOMY     BREAST EXCISIONAL BIOPSY Left    papilloma   BREAST LUMPECTOMY WITH RADIOACTIVE SEED LOCALIZATION Left 06/10/2018   Procedure: LEFT BREAST LUMPECTOMY WITH RADIOACTIVE SEED LOCALIZATION;  Surgeon: Erroll Luna, MD;  Location: [facility];  Service: General;  Laterality: Left;   BREAST LUMPECTOMY WITH RADIOACTIVE SEED LOCALIZATION Right 04/21/2020   Procedure: RIGHT BREAST LUMPECTOMY WITH RADIOACTIVE SEED LOCALIZATION;   Surgeon: Erroll Luna, MD;  Location: [facility];  Service: General;  Laterality: Right;   CESAREAN SECTION  1985   TONSILLECTOMY     torn menisicus Left     Family History  Problem Relation Age of Onset   Hypertension Mother    High Cholesterol Mother    Coronary artery disease Mother 8       CABG   Tuberculosis Father    Heart attack Maternal Grandfather        Died from MI   Coronary artery disease Maternal Uncle 51       CABG   Lung cancer Maternal Uncle        asbestosis   CAD Maternal Uncle 32       died - coronary aneurysm   Hyperlipidemia Sister    Hypertension Sister    Hypothyroidism Sister    Tuberculosis Paternal Grandmother    Cancer Paternal Grandfather    Hypothyroidism Sister     Allergies  Allergen Reactions   Hydrocodone-Acetaminophen Nausea And Vomiting   Phenergan [Promethazine]     Diagnosed with parkinsons, instructed by neurologist not to take phenergan    Propoxyphene Nausea Only   Tape Rash    Current Medications:   Current Outpatient Medications:    ACCU-CHEK GUIDE test strip, USE AS INSTRUCTED 3-4 X A DAY, Disp: 400 strip, Rfl: 1   Accu-Chek Softclix Lancets lancets, Use as instructed to check blood sugar 4 times daily, Disp:  400 each, Rfl: 3   Blood Glucose Monitoring Suppl (ACCU-CHEK GUIDE) w/Device KIT, AccuChek Guide LINK - the only meter approved to communicate with her Medtronic 770G insulin pump, Disp: 1 kit, Rfl: 0   carbidopa-levodopa (SINEMET IR) 25-100 MG tablet, TAKE 1 TABLET BY MOUTH 3 TIMES DAILY. 7AM/11AM/4PM, Disp: 270 tablet, Rfl: 1   Cholecalciferol (VITAMIN D3) 5000 units CAPS, Take 1 capsule by mouth daily. , Disp: , Rfl:    fexofenadine (ALLEGRA) 180 MG tablet, Take 180 mg by mouth daily. Taking 1 daily, Disp: , Rfl:    Glucagon 3 MG/DOSE POWD, Place 3 mg into the nose once as needed for up to 1 dose., Disp: 1 each, Rfl: 11   HUMALOG 100 UNIT/ML injection, USE AS DIRECTED  PER PUMP TAKE MAXIMUM 50 UNITS DAILY, Disp: 50 mL, Rfl: 1   ibuprofen (ADVIL) 800 MG tablet, Take 1 tablet (800 mg total) by mouth every 8 (eight) hours as needed., Disp: 30 tablet, Rfl: 0   Insulin Syringe-Needle U-100 (BD INSULIN SYRINGE U/F) 31G X 5/16" 0.3 ML MISC, Use 4x a day as advised, Disp: 300 each, Rfl: 3   levothyroxine (SYNTHROID) 112 MCG tablet, Take 1 tablet (112 mcg total) by mouth daily., Disp: 45 tablet, Rfl: 3   naproxen (NAPROSYN) 500 MG tablet, Take 1 tablet (500 mg total) by mouth 2 (two) times daily with a meal., Disp: 60 tablet, Rfl: 1   pravastatin (PRAVACHOL) 80 MG tablet, Take 1 tablet (80 mg total) by mouth daily., Disp: 90 tablet, Rfl: 3   ramipril (ALTACE) 10 MG capsule, TAKE 1 CAPSULE BY MOUTH EVERY DAY, Disp: 90 capsule, Rfl: 3   vitamin B-12 (CYANOCOBALAMIN) 500 MCG tablet, Take 1,000 mcg by mouth daily., Disp: , Rfl:    aspirin 81 MG chewable tablet, Chew 81 mg by mouth daily. (Patient not taking: Reported on 02/01/2021), Disp: , Rfl:    Review of Systems:   ROS Negative unless otherwise specified per HPI. Vitals:   Vitals:   02/01/21 1135  BP: 122/70  Pulse: 81  Temp: 97.8 F (36.6 C)  TempSrc: Temporal  SpO2: 98%  Weight: 185 lb 8 oz (84.1 kg)  Height: _0  (1.676 m)     Body mass index is 29.94 kg/m.  Physical Exam:   Physical Exam Vitals and nursing note reviewed.  Constitutional:      General: She is not in acute distress.    Appearance: She is well-developed. She is not ill-appearing or toxic-appearing.  HENT:     Head: Normocephalic.     Nose: Congestion present.     Right Sinus: Frontal sinus tenderness present.     Left Sinus: Frontal sinus tenderness present.  Cardiovascular:     Rate and Rhythm: Normal rate and regular rhythm.     Pulses: Normal pulses.     Heart sounds: Normal heart sounds, S1 normal and S2 normal.  Pulmonary:     Effort: Pulmonary effort is normal.     Breath sounds: Normal breath sounds.  Skin:     General: Skin is warm and dry.  Neurological:     Mental Status: She is alert.     GCS: GCS eye subscore is 4. GCS verbal subscore is 5. GCS motor subscore is 6.  Psychiatric:        Speech: Speech normal.        Behavior: Behavior normal. Behavior is cooperative.    Assessment and Plan:   Sinus pressure No red flags Suspect viral  URI; no indication for abx at this time Continue allegra 180 mg daily  Recommended patient push more fluids and rest Follow up if new/worsening/no improvement of symptoms or concerns occur  Type 1 DM Patient is requesting referral to RD for education  I,Havlyn C Ratchford,acting as a scribe for [employer], PA.,have documented all relevant documentation on the behalf of Inda Coke, PA,as directed by  Inda Coke, PA while in the presence of Inda Coke, [place].  I, Inda Coke, [place], have reviewed all documentation for this visit. The documentation on 02/01/21 for the exam, diagnosis, procedures, and orders are all accurate and complete.   Inda Coke, PA-C

## 2021-02-21 ENCOUNTER — Telehealth (INDEPENDENT_AMBULATORY_CARE_PROVIDER_SITE_OTHER): Payer: 59 | Admitting: Internal Medicine

## 2021-02-21 ENCOUNTER — Encounter: Payer: Self-pay | Admitting: Internal Medicine

## 2021-02-21 ENCOUNTER — Other Ambulatory Visit: Payer: Self-pay

## 2021-02-21 DIAGNOSIS — E041 Nontoxic single thyroid nodule: Secondary | ICD-10-CM

## 2021-02-21 DIAGNOSIS — E039 Hypothyroidism, unspecified: Secondary | ICD-10-CM

## 2021-02-21 DIAGNOSIS — E1069 Type 1 diabetes mellitus with other specified complication: Secondary | ICD-10-CM | POA: Diagnosis not present

## 2021-02-21 DIAGNOSIS — E782 Mixed hyperlipidemia: Secondary | ICD-10-CM

## 2021-02-21 DIAGNOSIS — E1065 Type 1 diabetes mellitus with hyperglycemia: Secondary | ICD-10-CM

## 2021-02-21 NOTE — Patient Instructions (Addendum)
Please continue: - basal rates: 12 AM: 0.775 2 AM: 0.800  5 AM: 0.950 9 AM: 0.775  - ICR: 1:10 except 12-2 pm: 1:9 - target: 100-120 - ISF: 50 - Active insulin time: 3 hours  Please do the following approximately 15 minutes before every meal: - Enter carbs (C) - Enter sugars (S) - Start insulin bolus (I)  Please continue levothyroxine 112 mcg daily.  Take the thyroid hormone every day, with water, at least 30 minutes before breakfast, separated by at least 4 hours from: - acid reflux medications - calcium - iron - multivitamins  Please return in 3- months.

## 2021-02-21 NOTE — Progress Notes (Signed)
Patient ID: Chelsea Mercado, female   DOB: November 17, 1960, 61 y.o.   MRN: [id number]   Patient location: Home My location: Office Persons participating in the virtual visit: patient, provider  Referring Provider: Inda Coke, PA  I connected with the patient on 02/21/21 at  1:45 PM EST by a video enabled telemedicine application and verified that I am speaking with the correct person.   I discussed the limitations of evaluation and management by telemedicine and the availability of in person appointments. The patient expressed understanding and agreed to proceed.   Details of the encounter are shown below.  HPI: Chelsea Mercado is a 61 y.o.-year-old female, returning for f/u for DM1, dx'ed at 61 y/o, uncontrolled, without long term complications and hypothyroidism. She saw endocrinology before: Dr. Cherie Dark.  Last visit with me 4.5 months ago.  Interim history: She continues on carbidopa/levodopa for Parkinson's disease.  She was in physical therapy. No increased urination, blurry vision, nausea, chest pain. She had a L knee torn meniscus in 07/2020 >> saw orthopedics >> pain improved. She restarted exercise, walking 7000-8000 steps a day.  Reviewed HbA1c levels: Lab Results  Component Value Date   HGBA1C 7.8 (A) 09/28/2020   HGBA1C 7.7 (A) 05/27/2020   HGBA1C 7.6 (A) 01/14/2020  02/14/2016: HbA1c 7.6%  Insulin pump: -Previously Medtronic 630 G- on her arm -started 2016-2017, newer pump started 01/2017 -Changed to Medtronic 770 G on 04/30/2019   CGM: -She is not wearing the CGM as she mentions that she does not have enough place on her body to wear both the pump and the CGM  Insulin: -Humalog   Pump settings: - basal rates: 12 AM: 0.775 units/h 2 AM: 0.800 5 AM: 0.950 9 AM: 0.775  - ICR: 1:10 except 12-2 pm: 1:9 - target: 100-120 - ISF: 50 - Active insulin time: 3 hours - bolus wizard: on TDD from basal insulin:  60% >> 62% >> 58% TDD from bolus insulin: 40% >> 38% >> 42% Total  daily dose: 31.7-50 >> 33-50 units  - changes infusion site: q 3-4 days  - Meter: Bayer Contour >> AccuChek Guide  Pt checks her sugars 4 times a day-  ave 140 +/- 43 >> 151 +/- 51 >> 154 +/- 46 >> 165 +/-46 >> 136: - am: 97-205 (site pb.)  - 2h after b'fast: ? - lunch: 74-120 - 2h after lunch: ? - dinner: 91-222 - 2h after dinner: ? - bedtime: ?  Prev:   Previously:  Lowest sugar was  50s (after walking) >> 60 >> 58 >> 74; she has hypoglycemia awareness in the 60s.  She had 1 previous ED visit for hypoglycemia many years ago.  She has inhaled glucagon at home. Highest sugar was 556 (infusion site pbs) ...>> 260 >> 406 (site pbs) >> 300s.  No previous DKA admissions.  Pt's meals are: - Breakfast: egg, bread, milk; milk + cereals; coffee - Lunch: Sandwich, soup, salad, fruit, chips; milk - Dinner: Meat, potatoes, brown rice, veggies or salad - Snacks: 1 She was walking 2-3 times a day (after meals) up to 1 hour a day, but stopped during the coronavirus pandemic.  She restarted exercise last year.  -No CKD, last BUN/creatinine:  Lab Results  Component Value Date   BUN 15 09/28/2020   BUN 13 04/18/2020   CREATININE 0.83 09/28/2020   CREATININE 0.90 04/18/2020   On ramipril 10.  -+ HL. Last set of lipids: Lab Results  Component Value Date   CHOL 181 09/28/2020  HDL 58.50 09/28/2020   LDLCALC 103 (H) 09/28/2020   TRIG 98.0 09/28/2020   CHOLHDL 3 09/28/2020  Previously on atorvastatin 40 >> 80  - increased recently b/c CAC 13 (73%).  - last eye exam was in 06/2020: No DR reportedly - Dr. Prudencio Burly.  - No numbness and tingling in her feet.  Pt has no FH of DM.  Hypothyroidism:  Reviewed her TFTs: Lab Results  Component Value Date   TSH 2.09 11/16/2020   TSH 0.23 (L) 09/28/2020   TSH 0.80 09/10/2019   TSH 2.99 09/01/2018   TSH 2.16 03/13/2018   TSH 1.59 09/23/2017   TSH 1.06 01/02/2017  02/10/2016: TSH 6.671 08/06/2015: TSH 4.35 03/26/2015: TSH  4.37 09/28/2014: TSH 2.73  Pt is on levothyroxine 112 mcg daily (dose decreased 09/2020), taken: - in am - fasting - at least 30 min from b'fast - no calcium - no iron - no multivitamins - no PPIs - not on Biotin  She has a history of vitamin D deficiency: Lab Results  Component Value Date   VD25OH 57.23 12/05/2020   VD25OH 63.7 09/10/2019   VD25OH 57.81 09/01/2018   VD25OH 47.47 10/01/2016  Prev: 02/10/2016: vit D 31 08/06/2015: vit D 21  She continues on 5000 units vitamin D daily.  She saw Dr. Brantley Stage for removal of a premalignant breast lesion.  She had breast lumpectomy with radioactive seed localization. She sees Dr. Carles Collet for Parkinson's disease.  Her mother had Merkel Cell CA - stage 4. She passed away 03/03/2018.  ROS: + See HPI Neurological: + tremors/no numbness/no tingling/no dizziness  I reviewed pt's medications, allergies, PMH, social hx, family hx, and changes were documented in the history of present illness. Otherwise, unchanged from my initial visit note..  Past Medical History:  Diagnosis Date   Anemia    Arthritis    Controlled type 1 diabetes mellitus without complication ([place]) 75/17/0017   Hyperlipidemia 06/22/2013   Obesity, Class I, BMI 30-34.9 06/22/2013   Parkinson disease ([place])    PONV (postoperative nausea and vomiting)    Post menopausal syndrome 06/22/2013   Primary hypothyroidism 06/22/2013   Vitamin D deficiency 09/24/2013   Past Surgical History:  Procedure Laterality Date   APPENDECTOMY     BREAST EXCISIONAL BIOPSY Left    papilloma   BREAST LUMPECTOMY WITH RADIOACTIVE SEED LOCALIZATION Left 06/10/2018   Procedure: LEFT BREAST LUMPECTOMY WITH RADIOACTIVE SEED LOCALIZATION;  Surgeon: Erroll Luna, MD;  Location: [facility];  Service: General;  Laterality: Left;   BREAST LUMPECTOMY WITH RADIOACTIVE SEED LOCALIZATION Right 04/21/2020   Procedure: RIGHT BREAST LUMPECTOMY WITH RADIOACTIVE SEED LOCALIZATION;  Surgeon:  Erroll Luna, MD;  Location: [facility];  Service: General;  Laterality: Right;   [place]     torn menisicus Left    Social History   Social History   Marital status: Married    Spouse name: N/A   Number of children: 3   Occupational History   Homemaker    Social History Main Topics   Smoking status: Never Smoker   Smokeless tobacco: Never Used   Alcohol use No   Drug use: No   Current Outpatient Medications on File Prior to Visit  Medication Sig Dispense Refill   ACCU-CHEK GUIDE test strip USE AS INSTRUCTED 3-4 X A DAY 400 strip 1   Accu-Chek Softclix Lancets lancets Use as instructed to check blood sugar 4 times daily 400 each 3   aspirin  81 MG chewable tablet Chew 81 mg by mouth daily. (Patient not taking: Reported on 02/01/2021)     Blood Glucose Monitoring Suppl (ACCU-CHEK GUIDE) w/Device KIT AccuChek Guide LINK - the only meter approved to communicate with her Medtronic 770G insulin pump 1 kit 0   carbidopa-levodopa (SINEMET IR) 25-100 MG tablet TAKE 1 TABLET BY MOUTH 3 TIMES DAILY. 7AM/11AM/4PM 270 tablet 1   Cholecalciferol (VITAMIN D3) 5000 units CAPS Take 1 capsule by mouth daily.      fexofenadine (ALLEGRA) 180 MG tablet Take 180 mg by mouth daily. Taking 1 daily     Glucagon 3 MG/DOSE POWD Place 3 mg into the nose once as needed for up to 1 dose. 1 each 11   HUMALOG 100 UNIT/ML injection USE AS DIRECTED PER PUMP TAKE MAXIMUM 50 UNITS DAILY 50 mL 1   ibuprofen (ADVIL) 800 MG tablet Take 1 tablet (800 mg total) by mouth every 8 (eight) hours as needed. 30 tablet 0   Insulin Syringe-Needle U-100 (BD INSULIN SYRINGE U/F) 31G X 5/16" 0.3 ML MISC Use 4x a day as advised 300 each 3   levothyroxine (SYNTHROID) 112 MCG tablet Take 1 tablet (112 mcg total) by mouth daily. 45 tablet 3   naproxen (NAPROSYN) 500 MG tablet Take 1 tablet (500 mg total) by mouth 2 (two) times daily with a meal. 60 tablet 1   pravastatin (PRAVACHOL) 80 MG  tablet Take 1 tablet (80 mg total) by mouth daily. 90 tablet 3   ramipril (ALTACE) 10 MG capsule TAKE 1 CAPSULE BY MOUTH EVERY DAY 90 capsule 3   vitamin B-12 (CYANOCOBALAMIN) 500 MCG tablet Take 1,000 mcg by mouth daily.     No current facility-administered medications on file prior to visit.   Allergies  Allergen Reactions   Hydrocodone-Acetaminophen Nausea And Vomiting   Phenergan [Promethazine]     Diagnosed with parkinsons, instructed by neurologist not to take phenergan    Propoxyphene Nausea Only   Tape Rash   Family History  Problem Relation Age of Onset   Hypertension Mother    High Cholesterol Mother    Coronary artery disease Mother 68       CABG   Tuberculosis Father    Heart attack Maternal Grandfather        Died from MI   Coronary artery disease Maternal Uncle 75       CABG   Lung cancer Maternal Uncle        asbestosis   CAD Maternal Uncle 52       died - coronary aneurysm   Hyperlipidemia Sister    Hypertension Sister    Hypothyroidism Sister    Tuberculosis Paternal Grandmother    Cancer Paternal Grandfather    Hypothyroidism Sister    PE: There were no vitals taken for this visit. Wt Readings from Last 3 Encounters:  02/01/21 185 lb 8 oz (84.1 kg)  01/03/21 187 lb 6.4 oz (85 kg)  12/05/20 188 lb 4 oz (85.4 kg)   Constitutional:  in NAD  The physical exam was not performed (virtual visit).  ASSESSMENT: 1. DM1, uncontrolled, without long term complications, but with hyperglycemia  2. Hypothyroidism  3. Thyroid nodule  4.  Hyperlipidemia  PLAN:  1. Patient with longstanding, uncontrolled, type 1 diabetes, on insulin pump, but without a CGM.  This visit was a virtual visit, as husband was diagnosed with COVID-19.  She is negative so far. -At last visit, reviewing her pump downloads, sugars are very variable,  but she has been out of town at that time and definitely not at her baseline.  We did not change the regimen at that time.  I did advise  her to enter more carbs into the pump, since she was only entering 30 to 40 g per meal.  I also advised her that if the sugars remain high in the following 2 weeks, to strengthen her insulin to carb ratios by 0.5.  She did not do this since last visit. -At last visit, she was interested in starting the t:slim insulin pump but I explained that the benefit from this comes mainly if it integrates with the CGM, which she did not want to try. -At today's visit, she mentions that sugars are mostly at goal, but she had higher blood sugars in the last weekend due to a slight problem on Saturday.  Before this, she feels that sugars improved especially after starting to go to the gym.  She was able to start going to the gym and walking consistently after her knee pain resolved.  I encouraged her to continue exercise and, since sugars appear to be mostly at goal with few exceptions, and without lows, I did not suggest a change in her regimen. -I advised her to:  Patient Instructions  Please continue: - basal rates: 12 AM: 0.775 2 AM: 0.800  5 AM: 0.950 9 AM: 0.775  - ICR: 1:10 except 12-2 pm: 1:9 - target: 100-120 - ISF: 50 - Active insulin time: 3 hours  Please do the following approximately 15 minutes before every meal: - Enter carbs (C) - Enter sugars (S) - Start insulin bolus (I)  Please continue levothyroxine 112 mcg daily.  Take the thyroid hormone every day, with water, at least 30 minutes before breakfast, separated by at least 4 hours from: - acid reflux medications - calcium - iron - multivitamins  Please return in 3- months.   - we we will check an HbA1c at next visit - advised to check sugars at different times of the day - 4x a day, rotating check times - advised for yearly eye exams >> she is UTD - return to clinic in 3-4 months    2.  Hypothyroidism  - latest thyroid labs reviewed with pt. >> normal: Lab Results  Component Value Date   TSH 2.09 11/16/2020  - she continues  on LT4 112 mcg daily (dose decreased at last visit) - pt feels good on this dose. - we discussed about taking the thyroid hormone every day, with water, >30 minutes before breakfast, separated by >4 hours from acid reflux medications, calcium, iron, multivitamins. Pt. is taking it correctly. - we will check this at next visit or at next blood draw by PCP, which ever first: Orders Placed This Encounter  Procedures   TSH   T4, free   3. H/o Thyroid nodule -No neck compression symptoms -Reviewed latest thyroid ultrasound report -No further investigation is needed for this  4. HL -Reviewed latest lipid panel from 09/2020: Fractions at goal: Lab Results  Component Value Date   CHOL 181 09/28/2020   HDL 58.50 09/28/2020   LDLCALC 103 (H) 09/28/2020   TRIG 98.0 09/28/2020   CHOLHDL 3 09/28/2020  -She continues on Crestor 20 mg daily without side effects  [name], MD PhD [facility] Endocrinology

## 2021-03-01 ENCOUNTER — Telehealth: Payer: Self-pay | Admitting: Physician Assistant

## 2021-03-01 DIAGNOSIS — E039 Hypothyroidism, unspecified: Secondary | ICD-10-CM

## 2021-03-01 DIAGNOSIS — E1065 Type 1 diabetes mellitus with hyperglycemia: Secondary | ICD-10-CM

## 2021-03-01 NOTE — Telephone Encounter (Signed)
Pt wants a call back from MA. Requesting referrals- did not provide details. Last [facility] 02/01/21.-

## 2021-03-02 NOTE — Telephone Encounter (Signed)
Referral done and placed in Epic.

## 2021-03-02 NOTE — Telephone Encounter (Signed)
Left message on voicemail to call office.  

## 2021-03-02 NOTE — Telephone Encounter (Signed)
Pt called back she would like a referral to Endocrinologist at [facility] Dr. Iran Planas. Told her will place the referral and someone will contact you to schedule an appt. Pt verbalized understanding.

## 2021-03-08 ENCOUNTER — Other Ambulatory Visit: Payer: Self-pay | Admitting: Neurology

## 2021-03-08 DIAGNOSIS — G20 Parkinson's disease: Secondary | ICD-10-CM

## 2021-03-23 ENCOUNTER — Ambulatory Visit: Payer: 59 | Admitting: Dietician

## 2021-03-25 IMAGING — US US  BREAST BX W/ LOC DEV 1ST LESION IMG BX SPEC US GUIDE*R*
1 series · 12 of 19 positions shown · non-contrast
Comparison: Previous exam(s).
COMPARISON: Previous exam(s).

Addendum:
CLINICAL DATA: 1.2 cm indeterminate mass in the 12 o'clock position
of the right breast and 3 morphologically abnormal left axillary
lymph nodes at recent mammography and ultrasound. The patient has
had a presumed bug bite in the left axilla for the past 6 months.
Initially, this was much larger, red and drained blood according to
the patient.

EXAM:
ULTRASOUND GUIDED RIGHT BREAST CORE NEEDLE BIOPSY
ULTRASOUND-GUIDED LEFT AXILLARY CORE NEEDLE BIOPSY

[Series 1: us breast bx w/ loc dev 1st lesion img bx spec us  · 0.07mm/px · 12 of 19 slices shown]
[im 1/19]
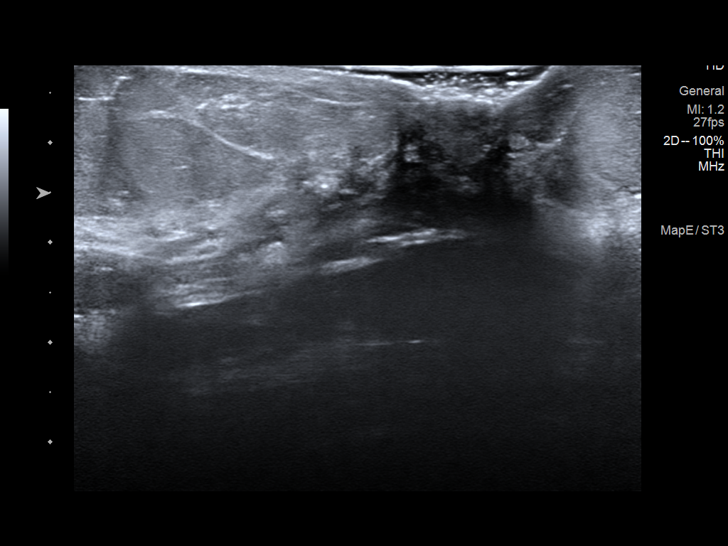
[im 3/19]
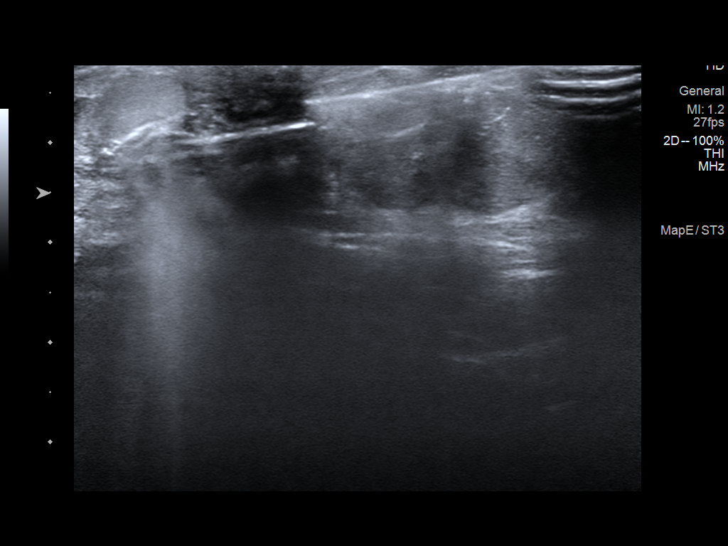
[im 4/19]
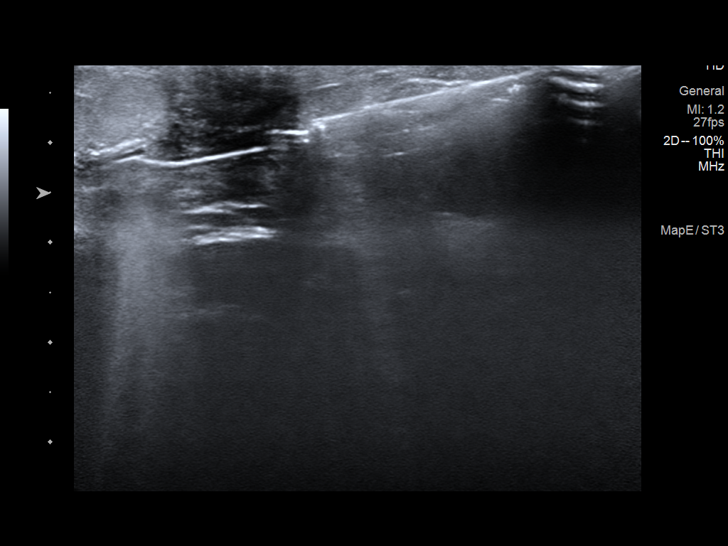
[im 6/19]
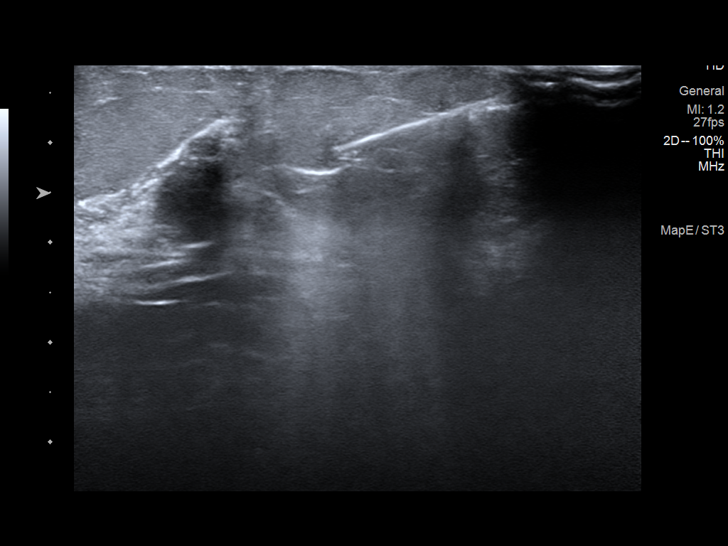
[im 8/19]
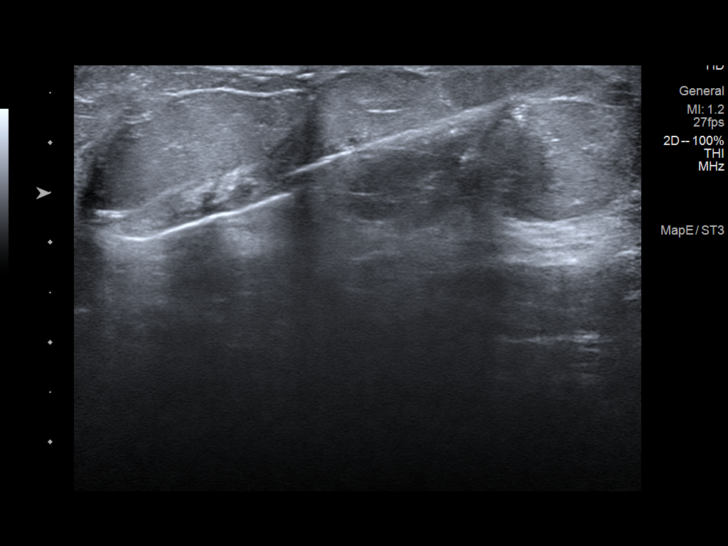
[im 9/19]
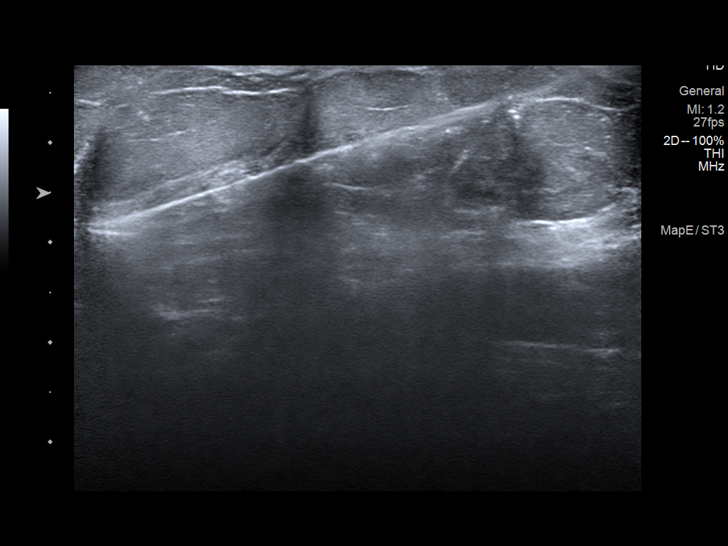
[im 11/19]
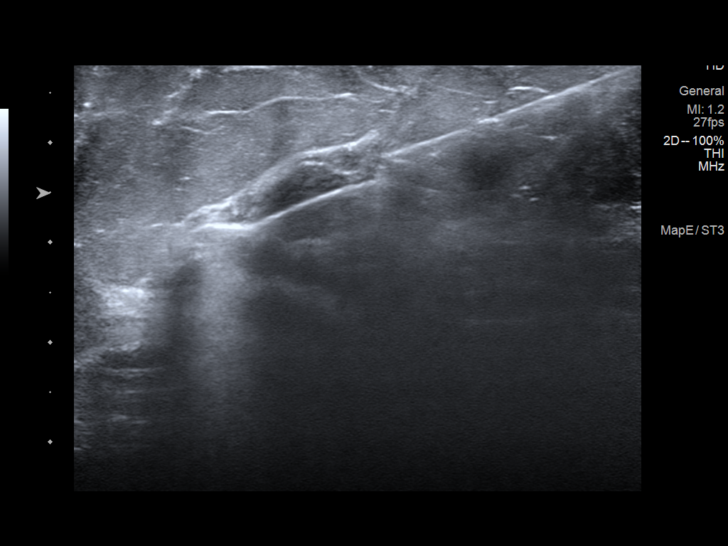
[im 12/19]
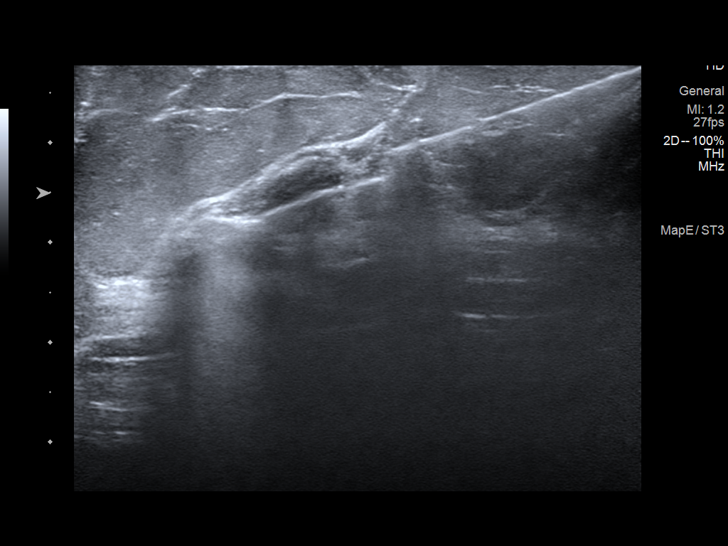
[im 14/19]
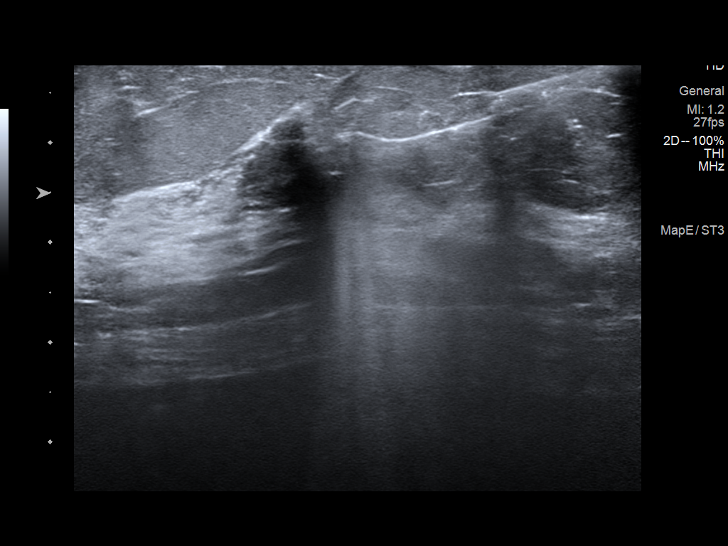
[im 16/19]
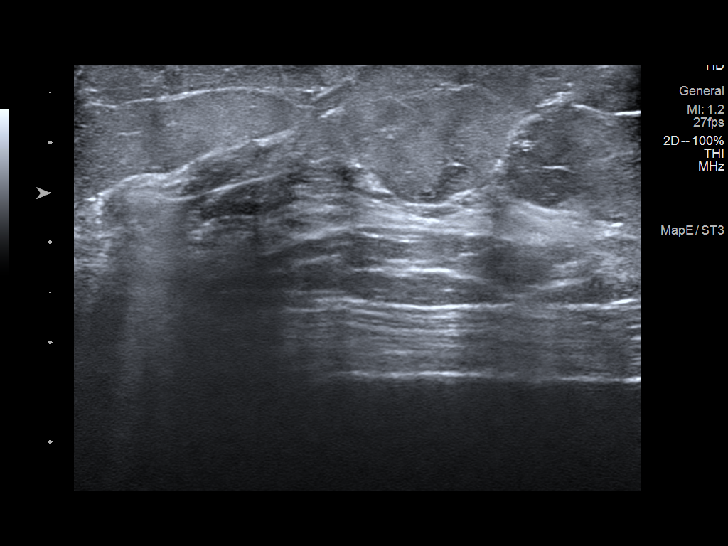
[im 17/19]
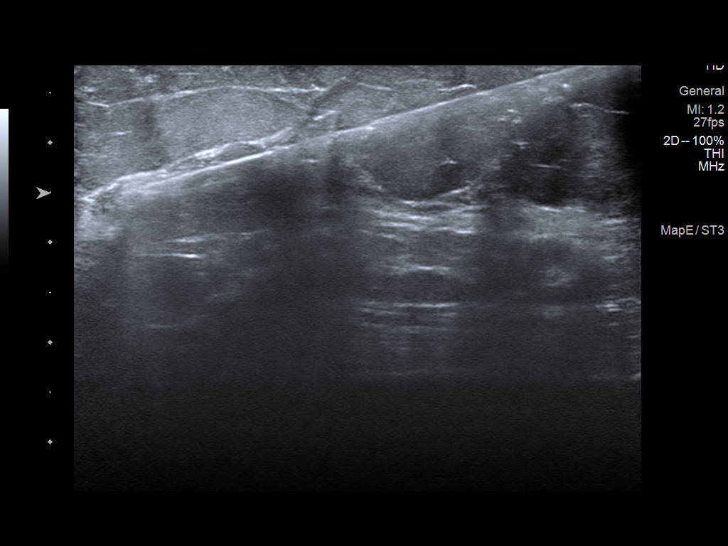
[im 19/19]
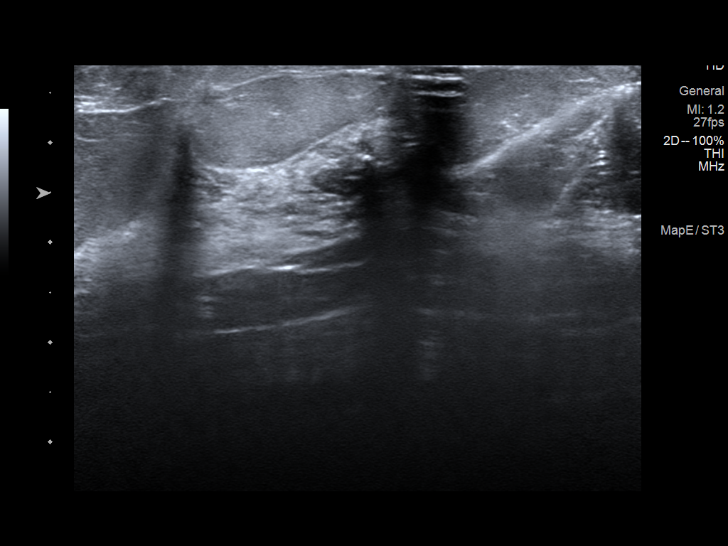

[12 of 19 positions shown; findings below may reference images not displayed]

PROCEDURE:
I met with the patient and we discussed the procedure of
ultrasound-guided biopsy, including benefits and alternatives. We
discussed the high likelihood of successful procedures. We discussed
the risks of the procedures, including infection, bleeding, tissue
injury, clip migration, and inadequate sampling. Informed written
consent was given. The usual time-out protocol was performed
immediately prior to the procedures.

SITE 1: 1.2 CM MASS IN THE 12 O'CLOCK POSITION OF THE RIGHT BREAST

Using sterile technique and 1% Lidocaine as local anesthetic, under
direct ultrasound visualization, a 12 gauge Ambroise device was
used to perform biopsy of the recently demonstrated 1.2 cm mass in
the 12 o'clock position of the right breast, 1 cm from the nipple,
using a medial approach. At the conclusion of the procedure a ribbon
shaped tissue marker clip was deployed into the biopsy cavity.
Follow up 2 view mammogram was performed and dictated separately.

SITE 2: 1 OF 3 ABNORMAL LEFT AXILLARY LYMPH NODES

Using sterile technique and 1% Lidocaine as local anesthetic, under
direct ultrasound visualization, a 12 gauge Ambroise device was
used to perform biopsy of 1 of the recently demonstrated abnormal
appearing left axillary lymph nodes using a caudal approach. At the
conclusion of the procedure a TriMark tissue marker clip was
deployed into the biopsy cavity. Follow up 2 view mammogram was
performed and dictated separately.
IMPRESSION: Ultrasound guided biopsy of the right breast and left axilla. No
apparent complications.

ADDENDUM:
Pathology revealed FIBROADENOMA, DUCTAL PAPILLOMA (EDGE OF),
MICROCALCIFICATIONS ARE ASSOCIATED WITH THESE PROCESSES of the RIGHT
breast, 12 o'clock. This was found to be concordant by Dr. Crejustr
Elanas, with excision recommended.

Pathology revealed REACTIVE LYMPH NODE of the LEFT axilla. This was
found to be concordant by Dr. Oneil Hundley.

Pathology results were discussed with the patient by telephone. The
patient reported doing well after the biopsies with tenderness at
the sites. Post biopsy instructions and care were reviewed and
questions were answered. The patient was encouraged to call The
direct phone number was provided.

Surgical consultation has been arranged with Dr. Ziaurrahman Tharakan at
[REDACTED] on January 11, 2020.

Pathology results reported by Limy Czarnecki, RN on 11/30/2019.

*** End of Addendum ***
PROCEDURE:
I met with the patient and we discussed the procedure of
ultrasound-guided biopsy, including benefits and alternatives. We
discussed the high likelihood of successful procedures. We discussed
the risks of the procedures, including infection, bleeding, tissue
injury, clip migration, and inadequate sampling. Informed written
consent was given. The usual time-out protocol was performed
immediately prior to the procedures.

SITE 1: 1.2 CM MASS IN THE 12 O'CLOCK POSITION OF THE RIGHT BREAST

Using sterile technique and 1% Lidocaine as local anesthetic, under
direct ultrasound visualization, a 12 gauge Ambroise device was
used to perform biopsy of the recently demonstrated 1.2 cm mass in
the 12 o'clock position of the right breast, 1 cm from the nipple,
using a medial approach. At the conclusion of the procedure a ribbon
shaped tissue marker clip was deployed into the biopsy cavity.
Follow up 2 view mammogram was performed and dictated separately.

SITE 2: 1 OF 3 ABNORMAL LEFT AXILLARY LYMPH NODES

Using sterile technique and 1% Lidocaine as local anesthetic, under
direct ultrasound visualization, a 12 gauge Ambroise device was
used to perform biopsy of 1 of the recently demonstrated abnormal
appearing left axillary lymph nodes using a caudal approach. At the
conclusion of the procedure a TriMark tissue marker clip was
deployed into the biopsy cavity. Follow up 2 view mammogram was
performed and dictated separately.
IMPRESSION: Ultrasound guided biopsy of the right breast and left axilla. No
apparent complications.

## 2021-04-07 NOTE — Progress Notes (Signed)
? ? ?Assessment/Plan:  ? ?1.  Parkinsons Disease ? -Continue carbidopa/levodopa 25/100, 1 tablet 3 times per day.  Exam was normal today ? -We discussed that it used to be thought that levodopa would increase risk of melanoma but now it is believed that Parkinsons itself likely increases risk of melanoma. she is to get regular skin checks.  She was given names of dermatologists to call for reference. ? ? ? ?Subjective:  ? ?Chelsea Mercado was seen today in follow up for Parkinsons disease.  My previous records were reviewed prior to todays visit as well as outside records available to me.  Doing well on levodopa, without falls, hallucinations or near syncope.  Last visit, she was wanting an MRI of the brain, primarily because of family history of some type of brain cancer.  She had that done on October 29, 2020.  I personally reviewed them.  There was mild white matter disease, mostly at the gray-white junction, but it was otherwise unremarkable.  We reviewed that today.  She does have a history of diabetes and hyperlipidemia. ? ?Current prescribed movement disorder medications: ?Carbidopa/levodopa 25/100, 1 tablet 3 times per day  ? ? ?ALLERGIES:   ?Allergies  ?Allergen Reactions  ? Hydrocodone-Acetaminophen Nausea And Vomiting  ? Phenergan [Promethazine]   ?  Diagnosed with parkinsons, instructed by neurologist not to take phenergan ?  ? Propoxyphene Nausea Only  ? Tape Rash  ? ? ?CURRENT MEDICATIONS:  ?Outpatient Encounter Medications as of 04/10/2021  ?Medication Sig  ? ACCU-CHEK GUIDE test strip USE AS INSTRUCTED 3-4 X A DAY  ? Accu-Chek Softclix Lancets lancets Use as instructed to check blood sugar 4 times daily  ? Blood Glucose Monitoring Suppl (ACCU-CHEK GUIDE) w/Device KIT AccuChek Guide LINK - the only meter approved to communicate with her Medtronic 770G insulin pump  ? carbidopa-levodopa (SINEMET IR) 25-100 MG tablet TAKE 1 TABLET BY MOUTH 3 TIMES A DAY  ? Cholecalciferol (VITAMIN D3) 5000 units CAPS Take 1  capsule by mouth daily.   ? fexofenadine (ALLEGRA) 180 MG tablet Take 180 mg by mouth daily. Taking 1 daily  ? Glucagon 3 MG/DOSE POWD Place 3 mg into the nose once as needed for up to 1 dose.  ? HUMALOG 100 UNIT/ML injection USE AS DIRECTED PER PUMP TAKE MAXIMUM 50 UNITS DAILY  ? ibuprofen (ADVIL) 800 MG tablet Take 1 tablet (800 mg total) by mouth every 8 (eight) hours as needed.  ? Insulin Syringe-Needle U-100 (BD INSULIN SYRINGE U/F) 31G X 5/16" 0.3 ML MISC Use 4x a day as advised  ? levothyroxine (SYNTHROID) 112 MCG tablet Take 1 tablet (112 mcg total) by mouth daily.  ? naproxen (NAPROSYN) 500 MG tablet Take 1 tablet (500 mg total) by mouth 2 (two) times daily with a meal.  ? pravastatin (PRAVACHOL) 80 MG tablet Take 1 tablet (80 mg total) by mouth daily.  ? ramipril (ALTACE) 10 MG capsule TAKE 1 CAPSULE BY MOUTH EVERY DAY  ? vitamin B-12 (CYANOCOBALAMIN) 500 MCG tablet Take 1,000 mcg by mouth daily.  ? aspirin 81 MG chewable tablet Chew 81 mg by mouth daily. (Patient not taking: Reported on 02/01/2021)  ? [DISCONTINUED] ACCU-CHEK GUIDE test strip USE AS INSTRUCTED 3-4 X A DAY  ? ?No facility-administered encounter medications on file as of 04/10/2021.  ? ? ?Objective:  ? ?PHYSICAL EXAMINATION:   ? ?VITALS:   ?Vitals:  ? 04/10/21 1044  ?BP: 134/74  ?Pulse: 66  ?SpO2: 99%  ?Weight: 187 lb 9.6 oz (  85.1 kg)  ?Height: 5' 4"  (1.626 m)  ? ? ? ? ?GEN:  The patient appears stated age and is in NAD. ?HEENT:  Normocephalic, atraumatic.  The mucous membranes are moist. The superficial temporal arteries are without ropiness or tenderness. ? ? ?Neurological examination: ? ?Orientation: The patient is alert and oriented x3. ?Cranial nerves: There is good facial symmetry with mild facial hypomimia. The speech is fluent and clear. Soft palate rises symmetrically and there is no tongue deviation. Hearing is intact to conversational tone. ?Sensation: Sensation is intact to light touch throughout ?Motor: Strength is at least  antigravity x4. ? ?Movement examination:  ?Tone: There is min increased tone in the LUE ?Abnormal movements: there is no tremor ?Coordination:  There is no decremation, with any form of RAMS, including alternating supination and pronation of the forearm, hand opening and closing, finger taps, heel taps and toe taps. ?Gait and Station: The patient has no difficulty arising out of a deep-seated chair without the use of the hands. The patient's stride length is good.  She runs and dances in the hall normally.  The patient has a positive pull test.    ? ?I have reviewed and interpreted the following labs independently ? ?  Chemistry   ?   ?Component Value Date/Time  ? NA 138 09/28/2020 1058  ? K 5.1 09/28/2020 1058  ? CL 102 09/28/2020 1058  ? CO2 30 09/28/2020 1058  ? BUN 15 09/28/2020 1058  ? CREATININE 0.83 09/28/2020 1058  ?    ?Component Value Date/Time  ? CALCIUM 9.6 09/28/2020 1058  ? ALKPHOS 64 11/05/2018 1131  ? AST 15 09/28/2020 1058  ? ALT 9 09/28/2020 1058  ? BILITOT 0.4 09/28/2020 1058  ?  ? ? ? ?Lab Results  ?Component Value Date  ? WBC 6.6 12/05/2020  ? HGB 13.1 12/05/2020  ? HCT 38.7 12/05/2020  ? MCV 90.2 12/05/2020  ? PLT 247.0 12/05/2020  ? ? ?Lab Results  ?Component Value Date  ? TSH 2.09 11/16/2020  ? ? ? ?Cc:  Inda Coke, PA ? ?

## 2021-04-10 ENCOUNTER — Ambulatory Visit: Payer: 59 | Admitting: Neurology

## 2021-04-10 ENCOUNTER — Encounter: Payer: Self-pay | Admitting: Neurology

## 2021-04-10 ENCOUNTER — Other Ambulatory Visit: Payer: Self-pay | Admitting: Internal Medicine

## 2021-04-10 VITALS — BP 134/74 | HR 66 | Ht 64.0 in | Wt 187.6 lb

## 2021-04-10 DIAGNOSIS — G20 Parkinson's disease: Secondary | ICD-10-CM | POA: Diagnosis not present

## 2021-04-10 NOTE — Patient Instructions (Signed)
As we discussed, it used to be thought that levodopa would increase risk of melanoma but now it is believed that Parkinsons itself likely increases risk of melanoma. I recommend yearly skin checks with a board certified dermatologist.  You can call [facility] or [facility] of [facility] for an appointment. ? ?[facility] ?Address: [place], [place], [place] [address] ?Phone: [phone] ? ?[facility] ?[place], [place] ?Phone: [phone] ? ?Local and Online Resources for Power over Parkinson's Group ?March 2023 ? ?LOCAL [place] PARKINSON'S GROUPS  ?Power over Parkinson's Group :   ?Power Over Parkinson's Patient Education Group will be Wednesday, March 8th-*Hybrid meting*- in person at [place] location and via [employer] at 2:00 pm.   ?Upcoming Power over Parkinson's Meetings:  2nd Wednesdays of the month at 2 pm:  March 8th, April 12th ?Contact Amy Marriott at [email or web address] if interested in participating in this group ?Parkinson's Care Partners Group:    3rd Mondays, Contact Misty Paladino ?Atypical Parkinsonian Patient Group:   4th Wednesdays, Contact Misty Paladino ?If you are interested in participating in these groups with Misty, please contact her directly for how to join those meetings.  Her contact information is [email or web address].   ? ?LOCAL EVENTS AND NEW OFFERINGS ?Parkinson's Wellness Event:  Pottery Night at [facility] [place].  Friday, March 10th 5:30-7:30 pm.  Sponsored by [employer] [place].  FREE event for people with Parkinson's and care partners.  RSVP to [facility] at [email or web address] ?Dine out at [employer].  Celebrate Parkinson's disease Awareness Month and Support the Parkinson's Movement Disorder Fund.   Wednesday, April 19th 4-6 pm at [place], [employer].  (Give receipt to cashier and 20% will be donated) ?Parkinson's  T-shirts for sale!  Designed by a local group member, with funds going to [place].  $20.00  Contact Misty to purchase (see email above) ?New PWR! Moves Class offering at [employer]!  Fridays 1-2 pm in March (likely to switch days and times after March).  Come try it out and see if PWR! Moves is a good fit for your exercise routine!  Contact Amy Marriott for details:  [email or web address] ?Hamil-Kerr Challenge Bike, Run, Walk for PD, PSP, MSA.    Saturday, April 8th at 8 am.  [place]  Proceeds go to offset costs of exercise programs locally.  To Register, visit website:  www.[email or web address] ? ?ONLINE EDUCATION AND SUPPORT ?[place]:  www.parkinson.org ?PD Health at Home continues:  Mindfulness Mondays, Expert Briefing Tuesdays, Wellness Wednesdays, Take Time Thursdays, Fitness Fridays  ?Upcoming Education:  ?Parkinson's and Medications:  What's New.  Wednesday, March 8th at 1:00 pm. ?Freezing and Fall Prevention in Parkinson's.  Wednesday, April 12th at 1:00 pm ?Register for Armed forces operational officer) at WatchCalls.si ?[place] Chapter Parkinson's Symposium: Cognition Changes- a free in-person ([place], [place]) and online Audiological scientist) event for people with Parkinson's and their loved ones. During this event we will explore new treatments and practical strategies for addressing these changes.  Saturday, April 1st, 10 am-1 pm.  Register at [employer].[email or web address]/ or contact [place] at [phone] or [email or web address]. ? Please check out their website to sign up for emails and see their full online offerings ? ?[place]:  www.[email or web address]  ?Third Thursday Webinars:  On the third Thursday of every month at 12 p.m. ET, join our free live webinars to learn about various aspects of living with Parkinson's disease and our work to speed  medical breakthroughs. ?Upcoming Webinar:  The Power of Women in Philanthropy.  Tues, March 28th at  12 pm. ?Check out additional information on their website to see their full online offerings ? ?[place]:  www.[email or web address] ?Upcoming Webinar:   Stay tuned ?Webinar Series:  Living with Parkinson's Meetup.   Third Thursdays of each month at 3 pm ?Care Partner Monthly Meetup.  With Robin Searing Phinney.  First Tuesday of each month, 2 pm ?Check out additional information to Live Well Today on their website ? ?Parkinson and Movement Disorders (PMD) Alliance:  www.pmdalliance.org ?NeuroLife Online:  Online Education Events ?Sign up for emails, which are sent weekly to give you updates on programming and online offerings ? ?Parkinson's Association of the [place]:  www.[email or web address] ?Information on online support groups, education events, and online exercises including Yoga, Parkinson's exercises and more-LOTS of information on links to PD resources and online events ?Virtual Support Group through [employer] of the [place]; next one is scheduled for Wednesday, *April 5th at 2 pm. *March meeting has been cancelled. (These are typically scheduled for the 1st Wednesday of the month at 2 pm).  Visit website for details. ?MOVEMENT AND EXERCISE OPPORTUNITIES ?Parkinson's DRUMMING Classes/Music Therapy with Doylene Canning:  This is a returning class and it's FREE!  2nd Mondays, continuing March 13th , 11:00 at the [place].  Contact *Misty Taylor-Paladino at [email or web address] or Doylene Canning at [phone] or [email or web address]  ?PWR! Moves Classes at [place].  Wednesdays 10 and 11 am.   NEW PWR! Moves Class offering at [employer].  Fridays 1-2 pm.  Contact Amy Marriott, PT [email or web address] if interested. ?Here is a link to the PWR!Moves classes on Zoom from  [place] - Daily Mon-Sat at 10:00. Via Zoom, FREE and open to all.  There is also a link below via Facebook if you use that platform. ?[email or web address].si ?[email or web address]://[email or web address] ? ?Parkinson's Wellness Recovery (PWR! Moves)  www.pwr4life.org ?Info on the PWR! Virtual Experience:  You will have access to our expertise through self-assessment, guided plans that start with the PD-specific fundamentals, educational content, tips, Q&A with an expert, and a growing Art therapist of PD-specific pre-recorded and live exercise classes of varying types and intensity - both physical and cognitive! If that is not enough, we offer 1:1 wellness consultations (in-person or virtual) to personalize your PWR! Research scientist (medical).  ?[employer] Fridays:  ?As part of the PD Health @ Home program, this free video series focuses each week on one aspect of fitness designed to support people living with Parkinson's.  These weekly videos highlight the [place] recent fitness guidelines for people with Parkinson's disease. ?www.KVTVnet.com.cy ?Dance for PD website is offering free, live-stream classes throughout the week, as well as links to [employer] of classes:  https://danceforparkinsons.org/ ?Dance for Parkinson's in-person class.  February 1-April 26, Wednesdays 4-5 pm.  Free class for people with Parkinson's disease, at [address], [place], [place].  Contact [phone] or [email or web address] to register ?Virtual dance and Pilates for Parkinson's classes: Click on the Community Tab> Parkinson's Movement Initiative Tab.  To register for classes and for more information, visit www.[email or web address].za and click the ?community? tab.  ?[employer]  Parkinson's Cycling Classes  ?[employer]:  Thursdays @ Noon-Live classes at [employer] ([employer] at [email or web address] or [phone]) ?[facility] [employer]: Virtual Classes Mondays and Thursday

## 2021-04-13 ENCOUNTER — Ambulatory Visit: Payer: 59 | Admitting: Neurology

## 2021-04-20 ENCOUNTER — Ambulatory Visit: Payer: 59 | Admitting: Neurology

## 2021-05-18 ENCOUNTER — Telehealth: Payer: Self-pay | Admitting: Internal Medicine

## 2021-05-18 NOTE — Telephone Encounter (Signed)
Patient called to cancel appointment on May 22, 2021 and declined to reschedule stating "She will not be coming back to [facility] anymore" ?

## 2021-05-22 ENCOUNTER — Ambulatory Visit: Payer: 59 | Admitting: Internal Medicine

## 2021-07-18 ENCOUNTER — Encounter (HOSPITAL_COMMUNITY): Payer: Self-pay

## 2021-09-01 ENCOUNTER — Other Ambulatory Visit: Payer: Self-pay | Admitting: Internal Medicine

## 2021-09-01 ENCOUNTER — Other Ambulatory Visit: Payer: Self-pay | Admitting: Neurology

## 2021-09-01 DIAGNOSIS — G20 Parkinson's disease: Secondary | ICD-10-CM

## 2021-09-04 ENCOUNTER — Other Ambulatory Visit: Payer: Self-pay

## 2021-09-04 DIAGNOSIS — G20 Parkinson's disease: Secondary | ICD-10-CM

## 2021-09-04 MED ORDER — CARBIDOPA-LEVODOPA 25-100 MG PO TABS: 1.0000 | ORAL_TABLET | Freq: Three times a day (TID) | ORAL | 1 refills | Status: DC

## 2021-10-02 ENCOUNTER — Encounter: Payer: Self-pay | Admitting: *Deleted

## 2021-10-10 NOTE — Progress Notes (Unsigned)
Assessment/Plan:   1.  Parkinsons Disease  -Continue carbidopa/levodopa 25/100, 1 tablet 3 times per day.      Subjective:   Chelsea Mercado was seen today in follow up for Parkinsons disease.  My previous records were reviewed prior to todays visit as well as outside records available to me.  Pt denies falls.  Pt denies lightheadedness, near syncope.  No hallucinations.  Mood has been good.  We discussed last visit the importance of having a dermatologist once per year and names were given.  She states that ***. she has changed endocrinologists and is now with [facility].  Notes are reviewed.  Current prescribed movement disorder medications: Carbidopa/levodopa 25/100, 1 tablet 3 times per day    ALLERGIES:   Allergies  Allergen Reactions   Hydrocodone-Acetaminophen Nausea And Vomiting   Phenergan [Promethazine]     Diagnosed with parkinsons, instructed by neurologist not to take phenergan    Propoxyphene Nausea Only   Tape Rash    CURRENT MEDICATIONS:  Outpatient Encounter Medications as of 10/11/2021  Medication Sig   carbidopa-levodopa (SINEMET IR) 25-100 MG tablet TAKE 1 TABLET BY MOUTH THREE TIMES A DAY   ACCU-CHEK GUIDE test strip USE AS INSTRUCTED 3-4 X A DAY   Accu-Chek Softclix Lancets lancets Use as instructed to check blood sugar 4 times daily   aspirin 81 MG chewable tablet Chew 81 mg by mouth daily. (Patient not taking: Reported on 02/01/2021)   Blood Glucose Monitoring Suppl (ACCU-CHEK GUIDE) w/Device KIT AccuChek Guide LINK - the only meter approved to communicate with her Medtronic 770G insulin pump   carbidopa-levodopa (SINEMET IR) 25-100 MG tablet Take 1 tablet by mouth 3 (three) times daily.   Cholecalciferol (VITAMIN D3) 5000 units CAPS Take 1 capsule by mouth daily.    fexofenadine (ALLEGRA) 180 MG tablet Take 180 mg by mouth daily. Taking 1 daily   Glucagon 3 MG/DOSE POWD Place 3 mg into the nose once as needed for up to 1 dose.   HUMALOG 100 UNIT/ML  injection USE AS DIRECTED PER PUMP TAKE MAXIMUM 50 UNITS DAILY   ibuprofen (ADVIL) 800 MG tablet Take 1 tablet (800 mg total) by mouth every 8 (eight) hours as needed.   Insulin Syringe-Needle U-100 (BD INSULIN SYRINGE U/F) 31G X 5/16" 0.3 ML MISC Use 4x a day as advised   levothyroxine (SYNTHROID) 112 MCG tablet Take 1 tablet (112 mcg total) by mouth daily.   naproxen (NAPROSYN) 500 MG tablet Take 1 tablet (500 mg total) by mouth 2 (two) times daily with a meal.   pravastatin (PRAVACHOL) 80 MG tablet Take 1 tablet (80 mg total) by mouth daily.   ramipril (ALTACE) 10 MG capsule TAKE 1 CAPSULE BY MOUTH EVERY DAY   vitamin B-12 (CYANOCOBALAMIN) 500 MCG tablet Take 1,000 mcg by mouth daily.   No facility-administered encounter medications on file as of 10/11/2021.    Objective:   PHYSICAL EXAMINATION:    VITALS:   There were no vitals filed for this visit.     GEN:  The patient appears stated age and is in NAD. HEENT:  Normocephalic, atraumatic.  The mucous membranes are moist. The superficial temporal arteries are without ropiness or tenderness.   Neurological examination:  Orientation: The patient is alert and oriented x3. Cranial nerves: There is good facial symmetry with mild facial hypomimia. The speech is fluent and clear. Soft palate rises symmetrically and there is no tongue deviation. Hearing is intact to conversational tone. Sensation: Sensation is  intact to light touch throughout Motor: Strength is at least antigravity x4.  Movement examination:  Tone: There is min increased tone in the LUE Abnormal movements: there is no tremor Coordination:  There is no decremation, with any form of RAMS, including alternating supination and pronation of the forearm, hand opening and closing, finger taps, heel taps and toe taps. Gait and Station: The patient has no difficulty arising out of a deep-seated chair without the use of the hands. The patient's stride length is good.  She runs  and dances in the hall normally.  The patient has a positive pull test.     I have reviewed and interpreted the following labs independently    Chemistry      Component Value Date/Time   NA 138 09/28/2020 1058   K 5.1 09/28/2020 1058   CL 102 09/28/2020 1058   CO2 30 09/28/2020 1058   BUN 15 09/28/2020 1058   CREATININE 0.83 09/28/2020 1058      Component Value Date/Time   CALCIUM 9.6 09/28/2020 1058   ALKPHOS 64 11/05/2018 1131   AST 15 09/28/2020 1058   ALT 9 09/28/2020 1058   BILITOT 0.4 09/28/2020 1058       Lab Results  Component Value Date   WBC 6.6 12/05/2020   HGB 13.1 12/05/2020   HCT 38.7 12/05/2020   MCV 90.2 12/05/2020   PLT 247.0 12/05/2020    Lab Results  Component Value Date   TSH 2.09 11/16/2020   Total time spent on today's visit was *** minutes, including both face-to-face time and nonface-to-face time.  Time included that spent on review of records (prior notes available to me/labs/imaging if pertinent), discussing treatment and goals, answering patient's questions and coordinating care.   Cc:  Inda Coke, PA

## 2021-10-11 ENCOUNTER — Encounter: Payer: Self-pay | Admitting: Neurology

## 2021-10-11 ENCOUNTER — Ambulatory Visit: Payer: 59 | Admitting: Neurology

## 2021-10-11 VITALS — BP 135/83 | HR 85 | Ht 64.0 in | Wt 182.8 lb

## 2021-10-11 DIAGNOSIS — G20A1 Parkinson's disease without dyskinesia, without mention of fluctuations: Secondary | ICD-10-CM

## 2021-10-11 NOTE — Patient Instructions (Signed)
Discussed [facility] ophthalmology:  Address: [place] [address], [place], [place] [address] Phone: [phone]  [place] for Power over Parkinson's Group September 2023  [place] over Parkinson's Group:   Power Over Parkinson's Patient Education Group will be Wednesday, September 13th-*Hybrid meting*- in person at [facility] location and via [facility] at 2 pm.   Upcoming Power over Parkinson's Meetings:  2nd Wednesdays of the month at 2 pm:  September 13th, October 11th, November 8th Contact Amy Marriott at [email or web address] if interested in participating in this group Parkinson's Care Partners Group:    3rd Mondays, Contact Misty Paladino Atypical Parkinsonian Patient Group:   4th Wednesdays, Contact Misty Paladino If you are interested in participating in these groups with Misty, please contact her directly for how to join those meetings.  Her contact information is [email or web address].    LOCAL EVENTS AND NEW OFFERINGS New PWR! Moves Dynegy Instructor-Led Classes offering at [employer]!  Wednesdays 1-2 pm.   Contact Vonna Kotyk at  [email or web address] or Caron Presume at [place], [email or web address] Dance for Parkinson 's classes will be on Tuesdays 9:30am-10:30am starting October 3-December 12 with a break the week of November 21 . Located in the [employer]  which is in the first floor of the [employer] ([place] for Parkinson's will be held on 2nd and 4th Mondays at 11:00am . First class will start  September 25th.  Located at the [place] ([place] Through support from the [place] and Drumming for Parkinson's classes are free for both patients and caregivers.  Contact Misty Taylor-Paladino for more details about registering.  [place]:  www.[email or web address] PD Health at Home continues:  Mindfulness Mondays, Wellness Wednesdays, Fitness Fridays  Upcoming Education:   Navigating Nutrition with PD.  Wednesday, Sept. 6th 1:00-2:00 pm Understanding Mind and Memory.  Wednesday, Sept. 20th 1:00-2:00 pm  Expert Briefing:    Parkinson's Disease and the Bladder.  Wednesday, Sept. 13th 1:00-2:00 pm Parkinson's and the Gut-Brain Connection.  Wednesday, Oct. 11th 1:00-2:00 pm Register for expert briefings (webinars) at WatchCalls.si Please check out their website to sign up for emails and see their full online offerings   [place]:  [email or web address]  Third Thursday Webinars:  On the third Thursday of every month at 12 p.m. ET, join our free live webinars to learn about various aspects of living with Parkinson's disease and our work to speed medical breakthroughs. Upcoming Webinar:  Stay tuned Check out additional information on their website to see their full online offerings  [employer]:  www.[email or web address] Upcoming Webinar:   Stay tuned Webinar Series:  Living with Parkinson's Meetup.   Third Thursdays each month, 3 pm Care Partner Monthly Meetup.  With Robin Searing Phinney.  First Tuesday of each month, 2 pm Check out additional information to Live Well Today on their website  Parkinson and Movement Disorders (PMD) Alliance:  www.pmdalliance.org NeuroLife Online:  Online Education Events Sign up for emails, which are sent weekly to give you updates on programming and online offerings  Parkinson's Association of the [place]:  www.parkinsonassociation.org Information on online support groups, education events, and online exercises including Yoga, Parkinson's exercises and more-LOTS of information on links to PD resources and online events Virtual Support Group through Parkinson's  Association of the [place]; next one is scheduled for Wednesday, October 4th at 2 pm. (  No September meeting due to the symposium.  These are typically scheduled for the 1st Wednesday of the month at 2 pm).  Visit website for details. Register for "Caring for Parkinson's-Caring for You", 9th Annual Symposium.  In-person event in [place].  September 9th.  To register:  www.[email or web address].org/symposium-registration/?blm_aid=[email or web address] MOVEMENT AND EXERCISE OPPORTUNITIES PWR! Moves Classes at [place].  Wednesdays 10 and 11 am.   Contact Amy Gerrit Friends, PT [email or web address] if interested. NEW PWR! Moves Class offerings at [employer].  Wednesdays 1-2 pm.  Contact Vonna Kotyk at  [email or web address] or Caron Presume at [employer],  [email or web address] Parkinson's Wellness Recovery (PWR! Moves)  www.pwr4life.org Info on the PWR! Virtual Experience:  You will have access to our expertise through self-assessment, guided plans that start with the PD-specific fundamentals, educational content, tips, Q&A with an expert, and a growing [employer] of PD-specific pre-recorded and live exercise classes of varying types and intensity - both physical and cognitive! If that is not enough, we offer 1:1 wellness consultations (in-person or virtual) to personalize your PWR! Art therapist.  [place] Fridays:  As part of the PD Health @ Home program, this free video series focuses each week on one aspect of fitness designed to support people living with Parkinson's.  These weekly videos highlight the [place] recent fitness guidelines for people with Parkinson's disease. [address] Dance for PD website is offering free, live-stream classes throughout the week, as well as links to ModemGamers.si of classes:  https://danceforparkinsons.org/ Virtual dance and Pilates for Parkinson's classes:  Click on the Community Tab> Parkinson's Movement Initiative Tab.  To register for classes and for more information, visit www.[employer] and click the "community" tab.  YMCA Parkinson's Cycling Classes  [employer]:  Thursdays @ Noon-Live classes at 02-22-1985 ([employer] at [email or web address] or [phone]) [facility] [employer]: Virtual Classes Mondays and Thursdays 02-22-1985 classes Tuesday, Wednesday and Thursday (contact [place] at [email or web address]  or [phone]) [place] Varied levels of classes are offered Tuesdays and Thursdays at [facility].  Stretching with [facility] weekly class is also offered for people with Parkinson's To observe a class or for more information, call [phone] or email [phone] at [email or web address] ADDITIONAL SUPPORT AND RESOURCES Well-Spring Solutions:Online Caregiver Education Opportunities:  www.well-springsolutions.org/caregiver-education/caregiver-support-group.  You may also contact Hezzie Bump at [email or web address] or [phone].    Coping with Difficult Caregiver Emotions.  Wednesday, September 20th, 10:30 am-12.  The [place], [facility] Navigating the [place] of Senior Care Options.  Thursday, September 28th, 4-5:15 pm.  The [facility]. Well-Spring Navigator:  07-08-1996 program, a free service to help individuals and families through the journey of determining care for older adults.  The "Navigator" is a [facility], [employer], who will speak with a prospective client and/or loved ones to provide an assessment of the situation and a set of recommendations for a personalized care plan -- all free of charge, and whether Well-Spring Solutions offers the needed service or not. If the need is not a service we provide, we are well-connected with reputable programs in town that we can refer you to.  www.well-springsolutions.org or  to speak with the Navigator, call [phone].

## 2021-10-26 LAB — HM DIABETES EYE EXAM

## 2021-11-22 ENCOUNTER — Ambulatory Visit (INDEPENDENT_AMBULATORY_CARE_PROVIDER_SITE_OTHER): Payer: 59 | Admitting: Physician Assistant

## 2021-11-22 ENCOUNTER — Encounter: Payer: Self-pay | Admitting: Physician Assistant

## 2021-11-22 VITALS — BP 110/60 | HR 80 | Temp 98.2°F | Ht 63.0 in | Wt 183.4 lb

## 2021-11-22 DIAGNOSIS — E1065 Type 1 diabetes mellitus with hyperglycemia: Secondary | ICD-10-CM

## 2021-11-22 DIAGNOSIS — Z0000 Encounter for general adult medical examination without abnormal findings: Secondary | ICD-10-CM | POA: Diagnosis not present

## 2021-11-22 DIAGNOSIS — Z23 Encounter for immunization: Secondary | ICD-10-CM

## 2021-11-22 DIAGNOSIS — E782 Mixed hyperlipidemia: Secondary | ICD-10-CM

## 2021-11-22 DIAGNOSIS — E039 Hypothyroidism, unspecified: Secondary | ICD-10-CM | POA: Diagnosis not present

## 2021-11-22 DIAGNOSIS — E1069 Type 1 diabetes mellitus with other specified complication: Secondary | ICD-10-CM | POA: Diagnosis not present

## 2021-11-22 DIAGNOSIS — G20A1 Parkinson's disease without dyskinesia, without mention of fluctuations: Secondary | ICD-10-CM

## 2021-11-22 LAB — COMPREHENSIVE METABOLIC PANEL
ALT: 7 U/L (ref 0–35)
AST: 19 U/L (ref 0–37)
Albumin: 4.1 g/dL (ref 3.5–5.2)
Alkaline Phosphatase: 63 U/L (ref 39–117)
BUN: 13 mg/dL (ref 6–23)
CO2: 32 mEq/L (ref 19–32)
Calcium: 9.6 mg/dL (ref 8.4–10.5)
Chloride: 101 mEq/L (ref 96–112)
Creatinine, Ser: 0.9 mg/dL (ref 0.40–1.20)
GFR: 69.1 mL/min (ref >60.00)
Glucose, Bld: 144 mg/dL — ABNORMAL HIGH (ref 70–99)
Potassium: 4.5 mEq/L (ref 3.5–5.1)
Sodium: 137 mEq/L (ref 135–145)
Total Bilirubin: 0.5 mg/dL (ref 0.2–1.2)
Total Protein: 6.7 g/dL (ref 6.0–8.3)

## 2021-11-22 LAB — CBC WITH DIFFERENTIAL/PLATELET
Basophils Absolute: 0 10*3/uL (ref 0.0–0.1)
Basophils Relative: 0.6 % (ref 0.0–3.0)
Eosinophils Absolute: 0.3 10*3/uL (ref 0.0–0.7)
Eosinophils Relative: 5.1 % — ABNORMAL HIGH (ref 0.0–5.0)
HCT: 41.5 % (ref 36.0–46.0)
Hemoglobin: 14 g/dL (ref 12.0–15.0)
Lymphocytes Relative: 26.7 % (ref 12.0–46.0)
Lymphs Abs: 1.8 10*3/uL (ref 0.7–4.0)
MCHC: 33.8 g/dL (ref 30.0–36.0)
MCV: 90.3 fl (ref 78.0–100.0)
Monocytes Absolute: 0.5 10*3/uL (ref 0.1–1.0)
Monocytes Relative: 7.5 % (ref 3.0–12.0)
Neutro Abs: 4.1 10*3/uL (ref 1.4–7.7)
Neutrophils Relative %: 60.1 % (ref 43.0–77.0)
Platelets: 288 10*3/uL (ref 150.0–400.0)
RBC: 4.59 Mil/uL (ref 3.87–5.11)
RDW: 13 % (ref 11.5–15.5)
WBC: 6.8 10*3/uL (ref 4.0–10.5)

## 2021-11-22 LAB — LIPID PANEL
Cholesterol: 191 mg/dL (ref 0–200)
HDL: 61.6 mg/dL (ref >39.00)
LDL Cholesterol: 113 mg/dL — ABNORMAL HIGH (ref 0–99)
NonHDL: 129.49
Total CHOL/HDL Ratio: 3
Triglycerides: 82 mg/dL (ref 0.0–149.0)
VLDL: 16.4 mg/dL (ref 0.0–40.0)

## 2021-11-22 LAB — TSH: TSH: 1.44 u[IU]/mL (ref 0.35–5.50)

## 2021-11-22 LAB — MICROALBUMIN / CREATININE URINE RATIO
Creatinine,U: 27.6 mg/dL
Microalb Creat Ratio: 2.5 mg/g (ref 0.0–30.0)
Microalb, Ur: <0.7 mg/dL (ref 0.0–1.9)

## 2021-11-22 NOTE — Progress Notes (Signed)
Subjective:    Chelsea Mercado is a 61 y.o. female and is here for a comprehensive physical exam.  HPI  Health Maintenance Due  Topic Date Due   OPHTHALMOLOGY EXAM  06/15/2021   INFLUENZA VACCINE  08/08/2021   Diabetic kidney evaluation - GFR measurement  09/28/2021   Diabetic kidney evaluation - Urine ACR  09/28/2021    Acute Concerns: No acute issues discussed.  Chronic Issues: DM Type 1 and Hypothyroidism Sees endocrinology. Her A1c is currently 8%. She has asked [place] to check her TSH for him. Denies any major concerns.  HLD Currently taking pravastatin 80 mg. Tolerating well.  Parkinson's Sees Dr. Carles Collet. Note reviewed. Planning to see dermatology soon.  Health Maintenance Cologuard -- last completed on 11/2020. Mammogram -- last completed on 04/20/2020. Radioactive seed localization RIGHT breast. No apparent complications. Her last screening was last completed 11/13/2019. PAP -- last completed on 12/31/2018. Exercise -- She reports that she started to walk again, but hasn't been exercising regularly.  UTD with dentist? - She is UTD on dental care. UTD with eye doctor? - She is UTD on vision care.  Weight history: Wt Readings from Last 10 Encounters:  11/22/21 183 lb 6.1 oz (83.2 kg)  10/11/21 182 lb 12.8 oz (82.9 kg)  04/10/21 187 lb 9.6 oz (85.1 kg)  02/01/21 185 lb 8 oz (84.1 kg)  01/03/21 187 lb 6.4 oz (85 kg)  12/05/20 188 lb 4 oz (85.4 kg)  11/21/20 184 lb 6.4 oz (83.6 kg)  10/11/20 185 lb 12.8 oz (84.3 kg)  09/28/20 185 lb (83.9 kg)  08/17/20 185 lb 9.6 oz (84.2 kg)   Body mass index is 32.48 kg/m. No LMP recorded. Patient is postmenopausal.  Alcohol use:  reports no history of alcohol use.  Tobacco use:  Tobacco Use: Low Risk  (11/22/2021)   Patient History    Smoking Tobacco Use: Never    Smokeless Tobacco Use: Never    Passive Exposure: Not on file   Eligible for lung cancer screening? UTD      11/22/2021    8:59 AM  Depression screen PHQ 2/9   Decreased Interest 0  Down, Depressed, Hopeless 0  PHQ - 2 Score 0     Other providers/specialists: Patient Care Team: Inda Coke, [place] as PCP - General (Physician Assistant) Leonie Man, MD as PCP - Cardiology (Cardiology) [name], MD as Consulting Physician (Internal Medicine) Katy Apo, MD as Consulting Physician (Ophthalmology) Tat, Eustace Quail, DO as Consulting Physician (Neurology)    PMHx, SurgHx, SocialHx, Medications, and Allergies were reviewed in the Visit Navigator and updated as appropriate.   Past Medical History:  Diagnosis Date   Anemia    Arthritis    Controlled type 1 diabetes mellitus without complication ([place]) 16/10/9602   Hyperlipidemia 06/22/2013   Obesity, Class I, BMI 30-34.9 06/22/2013   Parkinson disease    PONV (postoperative nausea and vomiting)    Post menopausal syndrome 06/22/2013   Primary hypothyroidism 06/22/2013   Vitamin D deficiency 09/24/2013     Past Surgical History:  Procedure Laterality Date   APPENDECTOMY     BREAST EXCISIONAL BIOPSY Left    papilloma   BREAST LUMPECTOMY WITH RADIOACTIVE SEED LOCALIZATION Left 06/10/2018   Procedure: LEFT BREAST LUMPECTOMY WITH RADIOACTIVE SEED LOCALIZATION;  Surgeon: Erroll Luna, MD;  Location: [facility];  Service: General;  Laterality: Left;   BREAST LUMPECTOMY WITH RADIOACTIVE SEED LOCALIZATION Right 04/21/2020   Procedure: RIGHT BREAST LUMPECTOMY WITH RADIOACTIVE  SEED LOCALIZATION;  Surgeon: Erroll Luna, MD;  Location: [place] [facility];  Service: General;  Laterality: Right;   CESAREAN SECTION  1985   TONSILLECTOMY     torn menisicus Left      Family History  Problem Relation Age of Onset   Hypertension Mother    High Cholesterol Mother    Coronary artery disease Mother 79       CABG   Tuberculosis Father    Hyperlipidemia Sister    Hypertension Sister    Hypothyroidism Sister    Other Sister        brain tumor; unsure if  cancer   Hypothyroidism Sister    Heart attack Maternal Grandfather        Died from MI   Tuberculosis Paternal Grandmother    Cancer Paternal Grandfather    Coronary artery disease Maternal Uncle 75       CABG   Lung cancer Maternal Uncle        asbestosis   CAD Maternal Uncle 61       died - coronary aneurysm    Social History   Tobacco Use   Smoking status: Never   Smokeless tobacco: Never  Vaping Use   Vaping Use: Never used  Substance Use Topics   Alcohol use: No   Drug use: No    Review of Systems:   Review of Systems  Constitutional:  Negative for chills, fever, malaise/fatigue and weight loss.  HENT:  Negative for hearing loss, sinus pain and sore throat.   Respiratory:  Negative for cough and hemoptysis.   Cardiovascular:  Negative for chest pain, palpitations, leg swelling and PND.  Gastrointestinal:  Negative for abdominal pain, constipation, diarrhea, heartburn, nausea and vomiting.  Genitourinary:  Negative for dysuria, frequency and urgency.  Musculoskeletal:  Negative for back pain, myalgias and neck pain.  Skin:  Negative for itching and rash.  Endo/Heme/Allergies:  Negative for polydipsia.  Psychiatric/Behavioral:  Negative for depression. The patient is not nervous/anxious.     Objective:   BP 110/60 (BP Location: Left Arm, Patient Position: Sitting, Cuff Size: Large)   Pulse 80   Temp 98.2 F (36.8 C) (Temporal)   Ht '5\' 3"'$  (1.6 m)   Wt 183 lb 6.1 oz (83.2 kg)   SpO2 96%   BMI 32.48 kg/m  Body mass index is 32.48 kg/m.   General Appearance:    Alert, cooperative, no distress, appears stated age  Head:    Normocephalic, without obvious abnormality, atraumatic  Eyes:    PERRL, conjunctiva/corneas clear, EOM's intact, fundi    benign, both eyes  Ears:    Normal TM's and external ear canals, both ears  Nose:   Nares normal, septum midline, mucosa normal, no drainage    or sinus tenderness  Throat:   Lips, mucosa, and tongue normal; teeth and  gums normal  Neck:   Supple, symmetrical, trachea midline, no adenopathy;    thyroid:  no enlargement/tenderness/nodules; no carotid   bruit or JVD  Back:     Symmetric, no curvature, ROM normal, no CVA tenderness  Lungs:     Clear to auscultation bilaterally, respirations unlabored  Chest Wall:    No tenderness or deformity   Heart:    Regular rate and rhythm, S1 and S2 normal, no murmur, rub or gallop  Breast Exam:    Deferred   Abdomen:     Soft, non-tender, bowel sounds active all four quadrants,    no masses, no  organomegaly  Genitalia:    Deferred  Extremities:   Extremities normal, atraumatic, no cyanosis or edema  Pulses:   2+ and symmetric all extremities  Skin:   Skin color, texture, turgor normal, no rashes or lesions  Lymph nodes:   Cervical, supraclavicular, and axillary nodes normal  Neurologic:   CNII-XII intact, normal strength, sensation and reflexes    throughout    Assessment/Plan:   Routine physical examination Today patient counseled on age appropriate routine health concerns for screening and prevention, each reviewed and up to date or declined. Immunizations reviewed and up to date or declined. Labs ordered and reviewed. Risk factors for depression reviewed and negative. Hearing function and visual acuity are intact. ADLs screened and addressed as needed. Functional ability and level of safety reviewed and appropriate. Education, counseling and referrals performed based on assessed risks today. Patient provided with a copy of personalized plan for preventive services.  Type 1 diabetes mellitus with hyperglycemia (HCC) Reviewed notes from endocrinology  Primary hypothyroidism Reviewed notes from endocrinology Will update TSH  Mixed diabetic hyperlipidemia associated with type 1 diabetes mellitus ([facility]) Update lipid panel and make recommendations accordingly  Parkinson's disease without dyskinesia or fluctuating manifestations Reviewed notes from  neurology  I,Verona Buck,acting as a scribe for Inda Coke, PA.,have documented all relevant documentation on the behalf of Inda Coke, PA,as directed by  Inda Coke, PA while in the presence of Inda Coke, [place].  I, Inda Coke, [place], have reviewed all documentation for this visit. The documentation on 11/22/21 for the exam, diagnosis, procedures, and orders are all accurate and complete.  Inda Coke, PA-C [place]

## 2021-11-22 NOTE — Patient Instructions (Addendum)
It was great to see you!  Please schedule your mammogram.  Please go to the lab for blood work.   Our office will call you with your results unless you have chosen to receive results via MyChart.  If your blood work is normal we will follow-up each year for physicals and as scheduled for chronic medical problems.  If anything is abnormal we will treat accordingly and get you in for a follow-up.  Take care,  Aldona Bar

## 2021-11-23 ENCOUNTER — Encounter: Payer: Self-pay | Admitting: Physician Assistant

## 2021-12-04 MED ORDER — EZETIMIBE 10 MG PO TABS: 10.0000 mg | ORAL_TABLET | Freq: Every day | ORAL | 3 refills | Status: DC

## 2021-12-30 ENCOUNTER — Other Ambulatory Visit: Payer: Self-pay | Admitting: Internal Medicine

## 2021-12-30 ENCOUNTER — Other Ambulatory Visit: Payer: Self-pay | Admitting: Physician Assistant

## 2022-01-02 ENCOUNTER — Encounter: Payer: Self-pay | Admitting: Physician Assistant

## 2022-02-15 ENCOUNTER — Other Ambulatory Visit: Payer: Self-pay | Admitting: Physician Assistant

## 2022-02-15 DIAGNOSIS — Z9889 Other specified postprocedural states: Secondary | ICD-10-CM

## 2022-02-19 ENCOUNTER — Ambulatory Visit: Payer: 59 | Admitting: Physician Assistant

## 2022-02-19 ENCOUNTER — Encounter: Payer: Self-pay | Admitting: Physician Assistant

## 2022-02-19 ENCOUNTER — Other Ambulatory Visit: Payer: Self-pay | Admitting: *Deleted

## 2022-02-19 VITALS — BP 100/50 | HR 83 | Temp 97.8°F | Ht 63.0 in | Wt 178.0 lb

## 2022-02-19 DIAGNOSIS — R350 Frequency of micturition: Secondary | ICD-10-CM

## 2022-02-19 DIAGNOSIS — J029 Acute pharyngitis, unspecified: Secondary | ICD-10-CM

## 2022-02-19 LAB — POC URINALSYSI DIPSTICK (AUTOMATED)
Bilirubin, UA: NEGATIVE
Blood, UA: NEGATIVE
Glucose, UA: POSITIVE — AB
Ketones, UA: 3
Leukocytes, UA: NEGATIVE
Nitrite, UA: NEGATIVE
Protein, UA: NEGATIVE
Spec Grav, UA: 1.015 (ref 1.010–1.025)
Urobilinogen, UA: 0.2 E.U./dL
pH, UA: 6 (ref 5.0–8.0)

## 2022-02-19 LAB — POC COVID19 BINAXNOW: SARS Coronavirus 2 Ag: POSITIVE — AB

## 2022-02-19 LAB — POCT RAPID STREP A (OFFICE): Rapid Strep A Screen: POSITIVE — AB

## 2022-02-19 MED ORDER — NIRMATRELVIR/RITONAVIR (PAXLOVID)TABLET: 3.0000 | ORAL_TABLET | Freq: Two times a day (BID) | ORAL | 0 refills | Status: AC

## 2022-02-19 MED ORDER — PENICILLIN V POTASSIUM 500 MG PO TABS: 500.0000 mg | ORAL_TABLET | Freq: Three times a day (TID) | ORAL | 0 refills | Status: AC

## 2022-02-19 MED ORDER — "BD VEO INSULIN SYRINGE U/F 31G X 15/64"" 0.3 ML MISC": 5 refills | Status: AC

## 2022-02-19 NOTE — Patient Instructions (Signed)
It was great to see you!  Start Paxlovid for your COVID Hold your Pravastatin while you are taking the Paxlovid  Start penicillin for your strep throat  I will be in touch with your urine results  COVID home recommendations  For current/suspected COVID symptoms: - Please watch closely for new onset shortness of breath, worsening shortness of breath, dizziness, confusion or any worsening symptoms. If any of these occur, please contact us during business hours, and if after business hours, please seek urgent care or go to the closest emergency room.  -Consider purchasing a pulse oximeter. If your levels are 94% or below persistently, please seek care at the hospital.   -If you test positive for COVID, everyone, regardless of vaccination status, should stay home for 5 days since symptom onset (or if asymptomatic, on day of positive test.) If you have no symptoms or your symptoms are resolving after 5 days, you can leave your house. Continue to wear a mask around others for 5 additional days. If you have a fever, continue to stay home until your fever resolves without use of medication.  -Please inform any contacts of your positive result so they can appropriately quarantine/test.  -Push fluids and try to eat small, frequent meals with protein to maintain your stamina.   Take care,  Inda Coke PA-C

## 2022-02-19 NOTE — Progress Notes (Signed)
Chelsea Mercado is a 62 y.o. female here for a new problem.  History of Present Illness:   Chief Complaint  Patient presents with   Sore Throat    Pt c/o sore throat and left ear pain, started last week with allergy symptoms took allergy med with relief. Yesterday started with symptoms.   URI Sore throat, ear pain and sinus congestion started last week Denies prior COVID infection She has had at least two COVID vaccines Has tried a few OTC cough/cold medications with little relief Denies fevers, chills, vomiting, lack of po intake  Urinary frequency Having significant urination yesterday Did drink more water yesterday than normal however she did not feel like her urination reflected that Denies: low back pain, painful urination, burning sensation, unusual vaginal discharge She had IDDM and denies any concerns with her blood sugars at this time, remains compliant with her insulin  Past Medical History:  Diagnosis Date   Anemia    Arthritis    Controlled type 1 diabetes mellitus without complication ([place]) A999333   Hyperlipidemia 06/22/2013   Obesity, Class I, BMI 30-34.9 06/22/2013   Parkinson disease    PONV (postoperative nausea and vomiting)    Post menopausal syndrome 06/22/2013   Primary hypothyroidism 06/22/2013   Vitamin D deficiency 09/24/2013     Social History   Tobacco Use   Smoking status: Never   Smokeless tobacco: Never  Vaping Use   Vaping Use: Never used  Substance Use Topics   Alcohol use: No   Drug use: No    Past Surgical History:  Procedure Laterality Date   APPENDECTOMY     BREAST EXCISIONAL BIOPSY Left    papilloma   BREAST LUMPECTOMY WITH RADIOACTIVE SEED LOCALIZATION Left 06/10/2018   Procedure: LEFT BREAST LUMPECTOMY WITH RADIOACTIVE SEED LOCALIZATION;  Surgeon: Erroll Luna, MD;  Location: [facility];  Service: General;  Laterality: Left;   BREAST LUMPECTOMY WITH RADIOACTIVE SEED LOCALIZATION Right 04/21/2020    Procedure: RIGHT BREAST LUMPECTOMY WITH RADIOACTIVE SEED LOCALIZATION;  Surgeon: Erroll Luna, MD;  Location: [facility];  Service: General;  Laterality: Right;   CESAREAN SECTION  1985   TONSILLECTOMY     torn menisicus Left     Family History  Problem Relation Age of Onset   Hypertension Mother    High Cholesterol Mother    Coronary artery disease Mother 75       CABG   Tuberculosis Father    Hyperlipidemia Sister    Hypertension Sister    Hypothyroidism Sister    Other Sister        brain tumor; unsure if cancer   Hypothyroidism Sister    Heart attack Maternal Grandfather        Died from MI   Tuberculosis Paternal Grandmother    Cancer Paternal Grandfather    Coronary artery disease Maternal Uncle 43       CABG   Lung cancer Maternal Uncle        asbestosis   CAD Maternal Uncle 34       died - coronary aneurysm    Allergies  Allergen Reactions   Hydrocodone-Acetaminophen Nausea And Vomiting   Phenergan [Promethazine]     Diagnosed with parkinsons, instructed by neurologist not to take phenergan    Propoxyphene Nausea Only   Tape Rash    Current Medications:   Current Outpatient Medications:    ACCU-CHEK GUIDE test strip, USE AS INSTRUCTED 3-4 X A DAY, Disp: 400 strip, Rfl: 0  Accu-Chek Softclix Lancets lancets, Use as instructed to check blood sugar 4 times daily, Disp: 400 each, Rfl: 3   ascorbic acid (VITAMIN C) 500 MG tablet, Take 1 tablet by mouth daily., Disp: , Rfl:    Blood Glucose Monitoring Suppl (ACCU-CHEK GUIDE) w/Device KIT, AccuChek Guide LINK - the only meter approved to communicate with her Medtronic 770G insulin pump, Disp: 1 kit, Rfl: 0   carbidopa-levodopa (SINEMET IR) 25-100 MG tablet, TAKE 1 TABLET BY MOUTH THREE TIMES A DAY, Disp: 270 tablet, Rfl: 0   Cholecalciferol (VITAMIN D3) 5000 units CAPS, Take 1 capsule by mouth daily. , Disp: , Rfl:    ezetimibe (ZETIA) 10 MG tablet, Take 1 tablet (10 mg total) by mouth daily.,  Disp: 90 tablet, Rfl: 3   fexofenadine (ALLEGRA) 180 MG tablet, Take 180 mg by mouth daily. Taking 1 daily, Disp: , Rfl:    Glucagon 3 MG/DOSE POWD, Place 3 mg into the nose once as needed for up to 1 dose., Disp: 1 each, Rfl: 11   HUMALOG 100 UNIT/ML injection, USE AS DIRECTED PER PUMP TAKE MAXIMUM 50 UNITS DAILY, Disp: 50 mL, Rfl: 1   levothyroxine (SYNTHROID) 112 MCG tablet, Take 1 tablet (112 mcg total) by mouth daily., Disp: 45 tablet, Rfl: 3   nirmatrelvir/ritonavir (PAXLOVID) 20 x 150 MG & 10 x 100MG TABS, Take 3 tablets by mouth 2 (two) times daily for 5 days. (Take nirmatrelvir 150 mg two tablets twice daily for 5 days and ritonavir 100 mg one tablet twice daily for 5 days), Disp: 30 tablet, Rfl: 0   penicillin v potassium (VEETID) 500 MG tablet, Take 1 tablet (500 mg total) by mouth 3 (three) times daily for 10 days., Disp: 30 tablet, Rfl: 0   pravastatin (PRAVACHOL) 80 MG tablet, TAKE 1 TABLET BY MOUTH EVERY DAY, Disp: 90 tablet, Rfl: 3   ramipril (ALTACE) 10 MG capsule, TAKE 1 CAPSULE BY MOUTH EVERY DAY, Disp: 90 capsule, Rfl: 3   vitamin B-12 (CYANOCOBALAMIN) 500 MCG tablet, Take 1,000 mcg by mouth daily., Disp: , Rfl:    Insulin Syringe-Needle U-100 (BD VEO INSULIN SYRINGE U/F) 31G X 15/64" 0.3 ML MISC, Use to inject insulin as needed., Disp: 10 each, Rfl: 5   Review of Systems:   ROS Negative unless otherwise specified per HPI.  Vitals:   Vitals:   02/19/22 1139  BP: (!) 100/50  Pulse: 83  Temp: 97.8 F (36.6 C)  TempSrc: Temporal  SpO2: 97%  Weight: 178 lb (80.7 kg)  Height: 5' 3"$  (1.6 m)     Body mass index is 31.53 kg/m.  Physical Exam:   Physical Exam Vitals and nursing note reviewed.  Constitutional:      General: She is not in acute distress.    Appearance: She is well-developed. She is not ill-appearing or toxic-appearing.  HENT:     Head: Normocephalic and atraumatic.     Right Ear: Tympanic membrane, ear canal and external ear normal. Tympanic  membrane is not erythematous, retracted or bulging.     Left Ear: Tympanic membrane, ear canal and external ear normal. Tympanic membrane is not erythematous, retracted or bulging.     Nose: Nose normal.     Right Sinus: No maxillary sinus tenderness or frontal sinus tenderness.     Left Sinus: No maxillary sinus tenderness or frontal sinus tenderness.     Mouth/Throat:     Pharynx: Uvula midline. Posterior oropharyngeal erythema present.     Tonsils: 0 on the  right. 0 on the left.  Eyes:     General: Lids are normal.     Conjunctiva/sclera: Conjunctivae normal.  Neck:     Trachea: Trachea normal.  Cardiovascular:     Rate and Rhythm: Normal rate and regular rhythm.     Pulses: Normal pulses.     Heart sounds: Normal heart sounds, S1 normal and S2 normal.  Pulmonary:     Effort: Pulmonary effort is normal.     Breath sounds: Normal breath sounds. No decreased breath sounds, wheezing, rhonchi or rales.  Lymphadenopathy:     Cervical: No cervical adenopathy.  Skin:    General: Skin is warm and dry.  Neurological:     Mental Status: She is alert.     GCS: GCS eye subscore is 4. GCS verbal subscore is 5. GCS motor subscore is 6.  Psychiatric:        Speech: Speech normal.        Behavior: Behavior normal. Behavior is cooperative.    Results for orders placed or performed in visit on 02/19/22  POCT rapid strep A  Result Value Ref Range   Rapid Strep A Screen Positive (A) Negative  POC COVID-19  Result Value Ref Range   SARS Coronavirus 2 Ag Positive (A) Negative  POCT Urinalysis Dipstick (Automated)  Result Value Ref Range   Color, UA amber    Clarity, UA clear    Glucose, UA Positive (A) Negative   Bilirubin, UA Negative    Ketones, UA 3    Spec Grav, UA 1.015 1.010 - 1.025   Blood, UA Negative    pH, UA 6.0 5.0 - 8.0   Protein, UA Negative Negative   Urobilinogen, UA 0.2 0.2 or 1.0 E.U./dL   Nitrite, UA Negative    Leukocytes, UA Negative Negative    Assessment and  Plan:   Sore throat Strep and COVID test positive Start penicillin (per patient request) and paxlovid Recommend holding statin during paxlovid use Push fluids and rest Worsening precautions advised Work-note provided to write her out of the office for a week  Urinary frequency She endorses slight improvement of symptoms today Continue adequate hydration Continue to keep close eye on blood sugars, there is presence of glucose in her urine  Will send urine for culture and add additional treatment if warranted    Inda Coke, PA-C

## 2022-02-20 LAB — URINE CULTURE
MICRO NUMBER:: 14552156
SPECIMEN QUALITY:: ADEQUATE

## 2022-03-05 ENCOUNTER — Encounter: Payer: Self-pay | Admitting: Physician Assistant

## 2022-03-06 NOTE — Progress Notes (Signed)
Chelsea Mercado is a 62 y.o. female here for a new problem.  History of Present Illness:   No chief complaint on file.   HPI  Irregular Heartbeat    Past Medical History:  Diagnosis Date   Anemia    Arthritis    Controlled type 1 diabetes mellitus without complication ([facility]) A999333   Hyperlipidemia 06/22/2013   Obesity, Class I, BMI 30-34.9 06/22/2013   Parkinson disease    PONV (postoperative nausea and vomiting)    Post menopausal syndrome 06/22/2013   Primary hypothyroidism 06/22/2013   Vitamin D deficiency 09/24/2013     Social History   Tobacco Use   Smoking status: Never   Smokeless tobacco: Never  Vaping Use   Vaping Use: Never used  Substance Use Topics   Alcohol use: No   Drug use: No    Past Surgical History:  Procedure Laterality Date   APPENDECTOMY     BREAST EXCISIONAL BIOPSY Left    papilloma   BREAST LUMPECTOMY WITH RADIOACTIVE SEED LOCALIZATION Left 06/10/2018   Procedure: LEFT BREAST LUMPECTOMY WITH RADIOACTIVE SEED LOCALIZATION;  Surgeon: Erroll Luna, MD;  Location: [facility];  Service: General;  Laterality: Left;   BREAST LUMPECTOMY WITH RADIOACTIVE SEED LOCALIZATION Right 04/21/2020   Procedure: RIGHT BREAST LUMPECTOMY WITH RADIOACTIVE SEED LOCALIZATION;  Surgeon: Erroll Luna, MD;  Location: [facility];  Service: General;  Laterality: Right;   CESAREAN SECTION  1985   TONSILLECTOMY     torn menisicus Left     Family History  Problem Relation Age of Onset   Hypertension Mother    High Cholesterol Mother    Coronary artery disease Mother 38       CABG   Tuberculosis Father    Hyperlipidemia Sister    Hypertension Sister    Hypothyroidism Sister    Other Sister        brain tumor; unsure if cancer   Hypothyroidism Sister    Heart attack Maternal Grandfather        Died from MI   Tuberculosis Paternal Grandmother    Cancer Paternal Grandfather    Coronary artery disease Maternal Uncle 40        CABG   Lung cancer Maternal Uncle        asbestosis   CAD Maternal Uncle 79       died - coronary aneurysm    Allergies  Allergen Reactions   Hydrocodone-Acetaminophen Nausea And Vomiting   Phenergan [Promethazine]     Diagnosed with parkinsons, instructed by neurologist not to take phenergan    Propoxyphene Nausea Only   Tape Rash    Current Medications:   Current Outpatient Medications:    ACCU-CHEK GUIDE test strip, USE AS INSTRUCTED 3-4 X A DAY, Disp: 400 strip, Rfl: 0   Accu-Chek Softclix Lancets lancets, Use as instructed to check blood sugar 4 times daily, Disp: 400 each, Rfl: 3   ascorbic acid (VITAMIN C) 500 MG tablet, Take 1 tablet by mouth daily., Disp: , Rfl:    Blood Glucose Monitoring Suppl (ACCU-CHEK GUIDE) w/Device KIT, AccuChek Guide LINK - the only meter approved to communicate with her Medtronic 770G insulin pump, Disp: 1 kit, Rfl: 0   carbidopa-levodopa (SINEMET IR) 25-100 MG tablet, TAKE 1 TABLET BY MOUTH THREE TIMES A DAY, Disp: 270 tablet, Rfl: 0   Cholecalciferol (VITAMIN D3) 5000 units CAPS, Take 1 capsule by mouth daily. , Disp: , Rfl:    ezetimibe (ZETIA) 10 MG tablet, Take  1 tablet (10 mg total) by mouth daily., Disp: 90 tablet, Rfl: 3   fexofenadine (ALLEGRA) 180 MG tablet, Take 180 mg by mouth daily. Taking 1 daily, Disp: , Rfl:    Glucagon 3 MG/DOSE POWD, Place 3 mg into the nose once as needed for up to 1 dose., Disp: 1 each, Rfl: 11   HUMALOG 100 UNIT/ML injection, USE AS DIRECTED PER PUMP TAKE MAXIMUM 50 UNITS DAILY, Disp: 50 mL, Rfl: 1   Insulin Syringe-Needle U-100 (BD VEO INSULIN SYRINGE U/F) 31G X 15/64" 0.3 ML MISC, Use to inject insulin as needed., Disp: 10 each, Rfl: 5   levothyroxine (SYNTHROID) 112 MCG tablet, Take 1 tablet (112 mcg total) by mouth daily., Disp: 45 tablet, Rfl: 3   pravastatin (PRAVACHOL) 80 MG tablet, TAKE 1 TABLET BY MOUTH EVERY DAY, Disp: 90 tablet, Rfl: 3   ramipril (ALTACE) 10 MG capsule, TAKE 1 CAPSULE BY MOUTH EVERY  DAY, Disp: 90 capsule, Rfl: 3   vitamin B-12 (CYANOCOBALAMIN) 500 MCG tablet, Take 1,000 mcg by mouth daily., Disp: , Rfl:    Review of Systems:   ROS  Vitals:   There were no vitals filed for this visit.   There is no height or weight on file to calculate BMI.  Physical Exam:   Physical Exam  Assessment and Plan:   There are no diagnoses linked to this encounter.   Inda Coke, PA-C

## 2022-03-07 ENCOUNTER — Encounter: Payer: Self-pay | Admitting: Physician Assistant

## 2022-03-07 ENCOUNTER — Ambulatory Visit: Payer: 59 | Admitting: Physician Assistant

## 2022-03-07 VITALS — BP 102/70 | HR 74 | Temp 97.5°F | Ht 63.0 in | Wt 179.5 lb

## 2022-03-07 DIAGNOSIS — R5383 Other fatigue: Secondary | ICD-10-CM

## 2022-03-07 DIAGNOSIS — E039 Hypothyroidism, unspecified: Secondary | ICD-10-CM

## 2022-03-07 DIAGNOSIS — E559 Vitamin D deficiency, unspecified: Secondary | ICD-10-CM | POA: Diagnosis not present

## 2022-03-07 DIAGNOSIS — R002 Palpitations: Secondary | ICD-10-CM

## 2022-03-07 LAB — COMPREHENSIVE METABOLIC PANEL
ALT: 6 U/L (ref 0–35)
AST: 17 U/L (ref 0–37)
Albumin: 3.7 g/dL (ref 3.5–5.2)
Alkaline Phosphatase: 61 U/L (ref 39–117)
BUN: 12 mg/dL (ref 6–23)
CO2: 31 mEq/L (ref 19–32)
Calcium: 9.4 mg/dL (ref 8.4–10.5)
Chloride: 102 mEq/L (ref 96–112)
Creatinine, Ser: 0.88 mg/dL (ref 0.40–1.20)
GFR: 70.84 mL/min (ref >60.00)
Glucose, Bld: 135 mg/dL — ABNORMAL HIGH (ref 70–99)
Potassium: 4.6 mEq/L (ref 3.5–5.1)
Sodium: 139 mEq/L (ref 135–145)
Total Bilirubin: 0.5 mg/dL (ref 0.2–1.2)
Total Protein: 6.5 g/dL (ref 6.0–8.3)

## 2022-03-07 LAB — CBC WITH DIFFERENTIAL/PLATELET
Basophils Absolute: 0.1 10*3/uL (ref 0.0–0.1)
Basophils Relative: 0.8 % (ref 0.0–3.0)
Eosinophils Absolute: 0.3 10*3/uL (ref 0.0–0.7)
Eosinophils Relative: 4.1 % (ref 0.0–5.0)
HCT: 41.4 % (ref 36.0–46.0)
Hemoglobin: 14 g/dL (ref 12.0–15.0)
Lymphocytes Relative: 26.1 % (ref 12.0–46.0)
Lymphs Abs: 1.7 10*3/uL (ref 0.7–4.0)
MCHC: 33.7 g/dL (ref 30.0–36.0)
MCV: 89.4 fl (ref 78.0–100.0)
Monocytes Absolute: 0.4 10*3/uL (ref 0.1–1.0)
Monocytes Relative: 6.8 % (ref 3.0–12.0)
Neutro Abs: 4 10*3/uL (ref 1.4–7.7)
Neutrophils Relative %: 62.2 % (ref 43.0–77.0)
Platelets: 313 10*3/uL (ref 150.0–400.0)
RBC: 4.62 Mil/uL (ref 3.87–5.11)
RDW: 13 % (ref 11.5–15.5)
WBC: 6.5 10*3/uL (ref 4.0–10.5)

## 2022-03-07 LAB — VITAMIN D 25 HYDROXY (VIT D DEFICIENCY, FRACTURES): VITD: 46.72 ng/mL (ref 30.00–100.00)

## 2022-03-07 LAB — VITAMIN B12: Vitamin B-12: 851 pg/mL (ref 211–911)

## 2022-03-07 LAB — TSH: TSH: 1.11 u[IU]/mL (ref 0.35–5.50)

## 2022-03-07 NOTE — Patient Instructions (Signed)
It was great to see you!  Update blood work today  Referral to cardiology  If new/worsening symptoms, call us or seek urgent care  Take care,  Inda Coke PA-C

## 2022-03-27 ENCOUNTER — Ambulatory Visit (INDEPENDENT_AMBULATORY_CARE_PROVIDER_SITE_OTHER): Payer: 59

## 2022-03-27 ENCOUNTER — Ambulatory Visit: Payer: 59 | Attending: Cardiovascular Disease | Admitting: Cardiovascular Disease

## 2022-03-27 ENCOUNTER — Encounter: Payer: Self-pay | Admitting: Cardiovascular Disease

## 2022-03-27 VITALS — BP 122/72 | HR 83 | Ht 64.0 in | Wt 181.2 lb

## 2022-03-27 DIAGNOSIS — E785 Hyperlipidemia, unspecified: Secondary | ICD-10-CM

## 2022-03-27 DIAGNOSIS — E1069 Type 1 diabetes mellitus with other specified complication: Secondary | ICD-10-CM

## 2022-03-27 DIAGNOSIS — R931 Abnormal findings on diagnostic imaging of heart and coronary circulation: Secondary | ICD-10-CM | POA: Diagnosis not present

## 2022-03-27 DIAGNOSIS — R002 Palpitations: Secondary | ICD-10-CM

## 2022-03-27 DIAGNOSIS — E782 Mixed hyperlipidemia: Secondary | ICD-10-CM

## 2022-03-27 NOTE — Assessment & Plan Note (Signed)
History of hyperlipidemia on high-dose pravastatin and Zetia with lipid profile performed 11/22/2021 revealing total cholesterol 91, LDL 113 and HDL 61.

## 2022-03-27 NOTE — Assessment & Plan Note (Signed)
Complaints of palpitation for last year occurring weekly lasting for seconds at a time.  I told her to reduce her caffeine intake.  Will get a 2D echo and a 2-week Zio patch.  I will see her back after that for follow-up.

## 2022-03-27 NOTE — Assessment & Plan Note (Signed)
Coronary calcium score measured 12/29/2020 was 13 on the RCA territory.  She is totally asymptomatic.

## 2022-03-27 NOTE — Progress Notes (Unsigned)
Enrolled for [employer] to mail a ZIO XT long term holter monitor to the patients address on file.  

## 2022-03-27 NOTE — Progress Notes (Signed)
03/27/2022 Chelsea Mercado   06-21-1960  [id number]  Primary Physician Inda Coke, [place] Primary Cardiologist: Lorretta Harp MD Lupe Carney, [place]  HPI:  Chelsea Mercado is a 62 y.o. 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  She was referred by Inda Coke, PA-C for palpitations.  She saw Dr. Glenetta Hew remotely (03/19/2018) for palpitations as well.  She does admit to drinking caffeine.  She is a type I diabetic and has hyperlipidemia not at goal on high-dose pravastatin and Zetia.  Her mother had bypass surgery in her 78s.  She is never had a heart attack or stroke.  She denies chest pain or shortness of breath.  She may have symptoms of sleep apnea.  She has had palpitations for years that occur weekly and are fairly annoying.   Current Meds  Medication Sig   ACCU-CHEK GUIDE test strip USE AS INSTRUCTED 3-4 X A DAY   Accu-Chek Softclix Lancets lancets Use as instructed to check blood sugar 4 times daily   ascorbic acid (VITAMIN C) 500 MG tablet Take 1 tablet by mouth daily.   Blood Glucose Monitoring Suppl (ACCU-CHEK GUIDE) w/Device KIT AccuChek Guide LINK - the only meter approved to communicate with her Medtronic 770G insulin pump   carbidopa-levodopa (SINEMET IR) 25-100 MG tablet TAKE 1 TABLET BY MOUTH THREE TIMES A DAY   Cholecalciferol (VITAMIN D3) 5000 units CAPS Take 1 capsule by mouth daily.    ezetimibe (ZETIA) 10 MG tablet Take 1 tablet (10 mg total) by mouth daily.   fexofenadine (ALLEGRA) 180 MG tablet Take 180 mg by mouth daily. Taking 1 daily   Glucagon 3 MG/DOSE POWD Place 3 mg into the nose once as needed for up to 1 dose.   HUMALOG 100 UNIT/ML injection USE AS DIRECTED PER PUMP TAKE MAXIMUM 50 UNITS DAILY   Insulin Syringe-Needle U-100 (BD VEO INSULIN SYRINGE U/F) 31G X 15/64" 0.3 ML MISC Use to inject insulin as needed.    levothyroxine (SYNTHROID) 112 MCG tablet Take 1 tablet (112 mcg total) by mouth daily.   pravastatin (PRAVACHOL) 80 MG tablet TAKE 1 TABLET BY MOUTH EVERY DAY   ramipril (ALTACE) 10 MG capsule TAKE 1 CAPSULE BY MOUTH EVERY DAY   vitamin B-12 (CYANOCOBALAMIN) 500 MCG tablet Take 1,000 mcg by mouth daily.     Allergies  Allergen Reactions   Hydrocodone-Acetaminophen Nausea And Vomiting   Phenergan [Promethazine]     Diagnosed with parkinsons, instructed by neurologist not to take phenergan    Propoxyphene Nausea Only   Tape Rash    Social History   Socioeconomic History   Marital status: Married    Spouse name: Not on file   Number of children: 3   Years of education: 12   Highest education level: High school graduate  Occupational History   Not on file  Tobacco Use   Smoking status: Never   Smokeless tobacco: Never  Vaping Use   Vaping Use: Never used  Substance and Sexual Activity   Alcohol use: No   Drug use: No   Sexual activity: Yes    Partners: Male  Other Topics Concern   Not on file  Social History Narrative   Married mother of 3, grandmother 45.  Lives with her husband and son.   Worked as Psychologist, counselling       Social Determinants of Health  Financial Resource Strain: Not on file  Food Insecurity: Not on file  Transportation Needs: Not on file  Physical Activity: Not on file  Stress: Not on file  Social Connections: Not on file  Intimate Partner Violence: Not on file     Review of Systems: General: negative for chills, fever, night sweats or weight changes.  Cardiovascular: negative for chest pain, dyspnea on exertion, edema, orthopnea, palpitations, paroxysmal nocturnal dyspnea or shortness of breath Dermatological: negative for rash Respiratory: negative for cough or wheezing Urologic: negative for hematuria Abdominal: negative for nausea, vomiting, diarrhea, bright red blood per rectum, melena, or hematemesis Neurologic: negative for visual  changes, syncope, or dizziness All other systems reviewed and are otherwise negative except as noted above.    Blood pressure 122/72, pulse 83, height 5\' 4"  (1.626 m), weight 181 lb 3.2 oz (82.2 kg), SpO2 96 %.  General appearance: alert and no distress Neck: no adenopathy, no carotid bruit, no JVD, supple, symmetrical, trachea midline, and thyroid not enlarged, symmetric, no tenderness/mass/nodules Lungs: clear to auscultation bilaterally Heart: regular rate and rhythm, S1, S2 normal, no murmur, click, rub or gallop Extremities: extremities normal, atraumatic, no cyanosis or edema Pulses: 2+ and symmetric Skin: Skin color, texture, turgor normal. No rashes or lesions Neurologic: Grossly normal  EKG sinus rhythm at 83 without ST or T wave changes.  Personally reviewed this EKG.  ASSESSMENT AND PLAN:   Mixed diabetic hyperlipidemia associated with type 1 diabetes mellitus ([place]) History of hyperlipidemia on high-dose pravastatin and Zetia with lipid profile performed 11/22/2021 revealing total cholesterol 91, LDL 113 and HDL 61.  Palpitations Complaints of palpitation for last year occurring weekly lasting for seconds at a time.  I told her to reduce her caffeine intake.  Will get a 2D echo and a 2-week Zio patch.  I will see her back after that for follow-up.  Elevated coronary artery calcium score Coronary calcium score measured 12/29/2020 was 13 on the RCA territory.  She is totally asymptomatic.     Lorretta Harp MD FACP,FACC,FAHA, [facility] 03/27/2022 11:33 AM

## 2022-03-27 NOTE — Patient Instructions (Signed)
Medication Instructions:  Your physician recommends that you continue on your current medications as directed. Please refer to the Current Medication list given to you today.  *If you need a refill on your cardiac medications before your next appointment, please call your pharmacy*   Testing/Procedures: Your physician has requested that you have an echocardiogram. Echocardiography is a painless test that uses sound waves to create images of your heart. It provides your doctor with information about the size and shape of your heart and how well your heart's chambers and valves are working. This procedure takes approximately one hour. There are no restrictions for this procedure. Please do NOT wear cologne, perfume, aftershave, or lotions (deodorant is allowed). Please arrive 15 minutes prior to your appointment time. This procedure will be done at [address]   ZIO XT- Long Term Monitor Instructions  Your physician has requested you wear a ZIO patch monitor for 14 days.  This is a single patch monitor. [employer] supplies one patch monitor per enrollment. Additional stickers are not available. Please do not apply patch if you will be having a Nuclear Stress Test,  Echocardiogram, Cardiac CT, MRI, or Chest Xray during the period you would be wearing the  monitor. The patch cannot be worn during these tests. You cannot remove and re-apply the  ZIO XT patch monitor.  Your ZIO patch monitor will be mailed 3 day USPS to your address on file. It may take 3-5 days  to receive your monitor after you have been enrolled.  Once you have received your monitor, please review the enclosed instructions. Your monitor  has already been registered assigning a specific monitor serial # to you.  Billing and Patient Assistance Program Information  We have supplied [employer] with any of your insurance information on file for billing purposes. [employer] offers a sliding scale Patient Assistance Program  for patients that do not have  insurance, or whose insurance does not completely cover the cost of the ZIO monitor.  You must apply for the Patient Assistance Program to qualify for this discounted rate.  To apply, please call [employer] at [phone], select option 4, select option 2, ask to apply for  Patient Assistance Program. [employer] will ask your household income, and how many people  are in your household. They will quote your out-of-pocket cost based on that information.  [employer] will also be able to set up a 12-month, interest-free payment plan if needed.  Applying the monitor   Shave hair from upper left chest.  Hold abrader disc by orange tab. Rub abrader in 40 strokes over the upper left chest as  indicated in your monitor instructions.  Clean area with 4 enclosed alcohol pads. Let dry.  Apply patch as indicated in monitor instructions. Patch will be placed under collarbone on left  side of chest with arrow pointing upward.  Rub patch adhesive wings for 2 minutes. Remove white label marked "1". Remove the white  label marked "2". Rub patch adhesive wings for 2 additional minutes.  While looking in a mirror, press and release button in center of patch. A small green light will  flash 3-4 times. This will be your only indicator that the monitor has been turned on.  Do not shower for the first 24 hours. You may shower after the first 24 hours.  Press the button if you feel a symptom. You will hear a small click. Record Date, Time and  Symptom in the Patient Logbook.  When   you are ready to remove the patch, follow instructions on the last 2 pages of Patient  Logbook. Stick patch monitor onto the last page of Patient Logbook.  Place Patient Logbook in the blue and white box. Use locking tab on box and tape box closed  securely. The blue and white box has prepaid postage on it. Please place it in the mailbox as  soon as possible. Your physician should have your test results  approximately 7 days after the  monitor has been mailed back to [facility].  Call [facility] at [phone] if you have questions regarding  your ZIO XT patch monitor. Call them immediately if you see an orange light blinking on your  monitor.  If your monitor falls off in less than 4 days, contact our Monitor department at [phone].  If your monitor becomes loose or falls off after 4 days call [employer] at [phone] for  suggestions on securing your monitor    Follow-Up: At [facility], you and your health needs are our priority.  As part of our continuing mission to provide you with exceptional heart care, we have created designated Provider Care Teams.  These Care Teams include your primary Cardiologist (physician) and Advanced Practice Providers (APPs -  Physician Assistants and Nurse Practitioners) who all work together to provide you with the care you need, when you need it.  We recommend signing up for the patient portal called "MyChart".  Sign up information is provided on this After Visit Summary.  MyChart is used to connect with patients for Virtual Visits (Telemedicine).  Patients are able to view lab/test results, encounter notes, upcoming appointments, etc.  Non-urgent messages can be sent to your provider as well.   To learn more about what you can do with MyChart, go to [email or web address].    Your next appointment:   6-8 week(s)  Provider:   Quay Burow, MD

## 2022-03-31 DIAGNOSIS — E785 Hyperlipidemia, unspecified: Secondary | ICD-10-CM | POA: Diagnosis not present

## 2022-03-31 DIAGNOSIS — R002 Palpitations: Secondary | ICD-10-CM | POA: Diagnosis not present

## 2022-04-05 ENCOUNTER — Other Ambulatory Visit: Payer: Self-pay | Admitting: Internal Medicine

## 2022-04-11 NOTE — Progress Notes (Signed)
All   Assessment/Plan:   1.  Parkinsons Disease  -Continue carbidopa/levodopa 25/100, 1 tablet 3 times per day.    -discussed exercise  -We discussed that it used to be thought that levodopa would increase risk of melanoma but now it is believed that Parkinsons itself likely increases risk of melanoma. she is to get regular skin checks.  Following with [facility] dermatology  2.  ? Intermittent myoclonus  -none seen today but she describes a few episodes of this.  I did not see any medications that should cause this on her list.  Her kidney function is essentially normal.  I told her to just keep an eye on it and see if she has further episodes.  3.  Palpitations  -Following with cardiology and currently wearing a Zio patch.  Subjective:   Chelsea Mercado was seen today in follow up for Parkinsons disease.  My previous records were reviewed prior to todays visit as well as outside records available to me.  Pt denies falls.  No significant deterioration with her Parkinson's disease.  She has had no near syncope.  Having some random jerks in the arms.  She has had some palpitations and saw Dr. Allyson SabalBerry on March 19 and in that regard.  She was told to reduce caffeine intake and they were going to get an echocardiogram and a Zio patch.  She is wearing that today.    Current prescribed movement disorder medications: Carbidopa/levodopa 25/100, 1 tablet 3 times per day    ALLERGIES:   Allergies  Allergen Reactions   Hydrocodone-Acetaminophen Nausea And Vomiting   Phenergan [Promethazine]     Diagnosed with parkinsons, instructed by neurologist not to take phenergan    Propoxyphene Nausea Only   Tape Rash    CURRENT MEDICATIONS:  Outpatient Encounter Medications as of 04/13/2022  Medication Sig   ACCU-CHEK GUIDE test strip USE AS INSTRUCTED 3-4 X A DAY   Accu-Chek Softclix Lancets lancets Use as instructed to check blood sugar 4 times daily   ascorbic acid (VITAMIN C) 500 MG tablet Take 1  tablet by mouth daily.   Blood Glucose Monitoring Suppl (ACCU-CHEK GUIDE) w/Device KIT AccuChek Guide LINK - the only meter approved to communicate with her Medtronic 770G insulin pump   carbidopa-levodopa (SINEMET IR) 25-100 MG tablet TAKE 1 TABLET BY MOUTH THREE TIMES A DAY   Cholecalciferol (VITAMIN D3) 5000 units CAPS Take 1 capsule by mouth daily.    ezetimibe (ZETIA) 10 MG tablet Take 1 tablet (10 mg total) by mouth daily.   fexofenadine (ALLEGRA) 180 MG tablet Take 180 mg by mouth daily. Taking 1 daily   Glucagon 3 MG/DOSE POWD Place 3 mg into the nose once as needed for up to 1 dose.   HUMALOG 100 UNIT/ML injection USE AS DIRECTED PER PUMP TAKE MAXIMUM 50 UNITS DAILY   Insulin Syringe-Needle U-100 (BD VEO INSULIN SYRINGE U/F) 31G X 15/64" 0.3 ML MISC Use to inject insulin as needed.   levothyroxine (SYNTHROID) 112 MCG tablet Take 1 tablet (112 mcg total) by mouth daily.   pravastatin (PRAVACHOL) 80 MG tablet TAKE 1 TABLET BY MOUTH EVERY DAY   ramipril (ALTACE) 10 MG capsule TAKE 1 CAPSULE BY MOUTH EVERY DAY   vitamin B-12 (CYANOCOBALAMIN) 500 MCG tablet Take 1,000 mcg by mouth daily.   No facility-administered encounter medications on file as of 04/13/2022.    Objective:   PHYSICAL EXAMINATION:    VITALS:   Vitals:   04/13/22 1041  BP: 128/70  Pulse: 74  SpO2: 97%  Weight: 181 lb 12.8 oz (82.5 kg)  Height: 5\' 4"  (1.626 m)   Wt Readings from Last 3 Encounters:  04/13/22 181 lb 12.8 oz (82.5 kg)  03/27/22 181 lb 3.2 oz (82.2 kg)  03/07/22 179 lb 8 oz (81.4 kg)    GEN:  The patient appears stated age and is in NAD. HEENT:  Normocephalic, atraumatic.  The mucous membranes are moist. The superficial temporal arteries are without ropiness or tenderness. Cardiovascular: Regular rate rhythm Lungs: Clear to auscultation bilaterally Neck: No bruits   Neurological examination:  Orientation: The patient is alert and oriented x3. Cranial nerves: There is good facial symmetry  with mild facial hypomimia. EOMI.  The speech is fluent and clear. Soft palate rises symmetrically and there is no tongue deviation. Hearing is intact to conversational tone. Sensation: Sensation is intact to light touch throughout Motor: Strength is at least antigravity x4.  Movement examination:  Tone: There is nl tone in the UE/LE Abnormal movements: there is no tremor Coordination:  There is no decremation, with any form of RAMS, including alternating supination and pronation of the forearm, hand opening and closing, finger taps, heel taps and toe taps. Gait and Station: The patient has no difficulty arising out of a deep-seated chair without the use of the hands. The patient's stride length is good.  She has good armswing.  I have reviewed and interpreted the following labs independently    Chemistry      Component Value Date/Time   NA 139 03/07/2022 1036   K 4.6 03/07/2022 1036   CL 102 03/07/2022 1036   CO2 31 03/07/2022 1036   BUN 12 03/07/2022 1036   CREATININE 0.88 03/07/2022 1036   CREATININE 0.83 09/28/2020 1058      Component Value Date/Time   CALCIUM 9.4 03/07/2022 1036   ALKPHOS 61 03/07/2022 1036   AST 17 03/07/2022 1036   ALT 6 03/07/2022 1036   BILITOT 0.5 03/07/2022 1036       Lab Results  Component Value Date   WBC 6.5 03/07/2022   HGB 14.0 03/07/2022   HCT 41.4 03/07/2022   MCV 89.4 03/07/2022   PLT 313.0 03/07/2022    Lab Results  Component Value Date   TSH 1.11 03/07/2022      Cc:  Jarold MottoWorley, Samantha, [place]

## 2022-04-13 ENCOUNTER — Encounter: Payer: Self-pay | Admitting: Neurology

## 2022-04-13 ENCOUNTER — Ambulatory Visit: Payer: 59 | Admitting: Neurology

## 2022-04-13 VITALS — BP 128/70 | HR 74 | Ht 64.0 in | Wt 181.8 lb

## 2022-04-13 DIAGNOSIS — G20A1 Parkinson's disease without dyskinesia, without mention of fluctuations: Secondary | ICD-10-CM | POA: Diagnosis not present

## 2022-04-13 NOTE — Patient Instructions (Signed)
Local and Online Resources for Power over Parkinson's Group  April 2024   LOCAL [place] PARKINSON'S GROUPS   Power over Parkinson's Group:    Power Over Parkinson's Patient Education Group will be Wednesday, April 10th-*Hybrid meting*- in person at [place] [facility] location and via WEBEX, 2:00-3:00 pm.   Power over Parkinson's and Care Partner Groups will meet together, with plans for separate break out session for caregivers, depending on topic/speaker Upcoming Power over Parkinson's Meetings/Care Partner Support:  2nd Wednesdays of the month at 2 pm:   April 10th, May 8th Contact Amy Marriott at [email or web address] if interested in participating in this group    LOCAL EVENTS AND NEW OFFERINGS  NEW:  Parkinson's Social Game Night.  First Thursday of each month, 2:00-4:00 pm.  *Next date is April 4th*.  [facility], [place].  Contact [email or web address] if interested. Parkinson's CarePartner Group for Men is in the works, if interested email [email or web address] ACT [employer] Chair Yoga classes "Train and Gain", Fridays 10 am, [employer].  Contact Gina at [phone].  Community Fitness Instructor-Led Parkinson's Exercises Classes offering at [employer]!  TUESDAYS (Chair Yoga)  and Wednesdays (PWR! Moves)  1:00 pm.   Contact Christy Weaver at  [email or web address]  or [phone]  Drumming for Parkinson's will be held on 2nd and 4th Mondays at 11:00 am.   Located at the [employer] ([address] [place].)  Contact Jane Maydian at [email or web address] or [phone]  Dance for Parkinson 's classes will be on Tuesdays 10-11 am. Located in the [facility], in the first floor of the [facility] ([address] To register:  [email or web address] or [phone] [employer] Parkinson's Tai Chi Class, Mondays at 11 am.  Call [phone] for  details Hamil-Kerr Challenge.  Bike, Run, Walk Fundraiser for Parkinson's.  Saturday, April 6th at [place].  To register, visit [email or web address] Moving Day [place].  Saturday, May 4th, 10 am start.  Register at [email or web address]    ONLINE EDUCATION AND SUPPORT  Parkinson Foundation:  www.parkinson.org  PD Health at Home continues:  Mindfulness Mondays, Wellness Wednesdays, Fitness Fridays  (PWR! Moves as part of Fitness Fridays March 22nd, 1-1:45 pm) Upcoming Education:   Parkinson's 101.  Wednesday, April 3rd, 1-2 pm Movement for Parkinson's.  Wednesday, May 1st, 1-2 pm Expert Briefing:  Research Update:  Working to Halt PD.  Wednesday, April 10th, 1-2 pm Trouble with Zzz's:  Sleep Challenges with Parkinson's.  Wed, May 8th 1-2 pm Register for virtual education and expert briefings (webinars) at [email or web address]/resources-support/[email or web address]-education Please check out their website to sign up for emails and see their full online offerings     Michael J Fox Foundation:  www.michaeljfox.org   Third Thursday Webinars:  On the third Thursday of every month at 12 p.m. ET, join our free live webinars to learn about various aspects of living with Parkinson's disease and our work to speed medical breakthroughs.  Upcoming Webinar:  Let's Talk Taboos:  Hard-to-Discuss Parkinson's Symptoms.  Thursday, April 18th at 12 noon. Check out additional information on their website to see their full online offerings    Davis Phinney Foundation:  www.davisphinneyfoundation.org  Upcoming Webinar:   [facility].  Wednesday, April 2nd, 4 pm Series:  Living with Parkinson's Meetup.   Third Thursdays each month, 3 pm  Care Partner Monthly Meetup.  With Connie Carpenter Phinney.  First Tuesday of each month, 2 pm  Check out additional   information to Live Well Today on their website    Parkinson and Movement Disorders (PMD) Alliance:  www.pmdalliance.org  [employer]  Online:  Online Education Events  Sign up for emails, which are sent weekly to give you updates on programming and online offerings    Parkinson's Association of [place]:  www.parkinsonassociation.org  Information on online support groups, education events, and online exercises including Yoga, Parkinson's exercises and more-LOTS of information on links to PD resources and online events  Virtual Support Group through Parkinson's Association of [place]; next one is scheduled for Wednesday, April 3rd  MOVEMENT AND EXERCISE OPPORTUNITIES  PWR! Moves Classes at [place] [facility].  Wednesdays 10 and 11 am.   Contact Amy Marriott, PT [email or web address] if interested.  Parkinson's Exercise Class offerings at [employer]. *TUESDAYS* (Chair yoga) and Wednesdays (PWR! Moves)  1:00 pm.    Contact Christy Weaver at [email or web address]    Parkinson's Wellness Recovery (PWR! Moves)  www.pwr4life.org  Info on the PWR! Virtual Experience:  You will have access to our expertise?through self-assessment, guided plans that start with the PD-specific fundamentals, educational content, tips, Q&A with an expert, and a growing library of PD-specific pre-recorded and live exercise classes of varying types and intensity - both physical and cognitive! If that is not enough, we offer 1:1 wellness consultations (in-person or virtual) to personalize your PWR! Virtual Experience.   Parkinson Foundation Fitness Fridays:   As part of the PD Health @ Home program, this free video series focuses each week on one aspect of fitness designed to support people living with Parkinson's.? These weekly videos highlight the Parkinson Foundation fitness guidelines for people with Parkinson's disease.  www.parkinson.org/resources-support/online-education/pdhealth#ff  Dance for PD website is offering free, live-stream classes throughout the week, as well as links to digital library of classes:   [email or web address]  Virtual dance and Pilates for Parkinson's classes: Click on the Community Tab> Parkinson's Movement Initiative Tab.  To register for classes and for more information, visit www.[email or web address] and click the "community" tab.   [employer] Parkinson's Cycling Classes   [facility]:  Thursdays @ Noon-Live classes at [facility] [facility] (Contact Margaret Hazen at [email or web address]?or [phone])  [facility]: Classes Tuesday, Wednesday and Thursday (contact Marlee at [email or web address] ?or [phone])  [place] Steady Boxing  Varied levels of classes are offered Tuesdays and Thursdays at [facility].   Stretching with Maria weekly class is also offered for people with Parkinson's  To observe a class or for more information, call [phone] or email Hillary Savage at [email or web address]   ADDITIONAL SUPPORT AND RESOURCES  [employer]:  Online Caregiver Education Opportunities:  www.well-springsolutions.org/caregiver-education/caregiver-support-group.  You may also contact Jodi Kolada at [email or web address] or [phone].     [employer] April Offerings National HealthCare Decisions Day:  The Most Critical Legal and Medical Decisions to Consider Now!  Tuesday, April 16th, 1-3 pm at [facility] at [address].  Contact Jodi Kolada at [email or web address] or [phone] Powerful Tools for Caregivers.  6 week educational series for caregivers.  April 18-May 23, 10:30 am-12:15 pm at [facility] 3rd Floor Conference Room.   Contact Jodi Kolada at [email or web address] or [phone] to register [facility] Navigator:  Just1Navigator program, a?free service to help individuals and families through the journey of determining care for older adults.  The "Navigator" is a social worker, Nicole Reynolds, who will speak with a prospective client and/or loved  ones to provide an assessment of the situation and a set of recommendations for a personalized care   plan -- all free of charge, and whether?[facility] offers the needed service or not. If the need is not a service we provide, we are well-connected with reputable programs in town that we can refer you to.  www.well-springsolutions.org or to speak with the Navigator, call [phone].     

## 2022-05-01 ENCOUNTER — Ambulatory Visit (HOSPITAL_COMMUNITY): Payer: 59 | Attending: Cardiovascular Disease

## 2022-05-01 DIAGNOSIS — R002 Palpitations: Secondary | ICD-10-CM | POA: Diagnosis present

## 2022-05-01 DIAGNOSIS — E785 Hyperlipidemia, unspecified: Secondary | ICD-10-CM | POA: Insufficient documentation

## 2022-05-01 LAB — ECHOCARDIOGRAM COMPLETE
Area-P 1/2: 2.81 cm2
S' Lateral: 2.8 cm

## 2022-05-08 ENCOUNTER — Encounter: Payer: Self-pay | Admitting: Cardiovascular Disease

## 2022-05-08 ENCOUNTER — Ambulatory Visit: Payer: 59 | Attending: Cardiovascular Disease | Admitting: Cardiovascular Disease

## 2022-05-08 VITALS — BP 128/62 | HR 77 | Ht 64.0 in | Wt 180.8 lb

## 2022-05-08 DIAGNOSIS — E782 Mixed hyperlipidemia: Secondary | ICD-10-CM | POA: Diagnosis not present

## 2022-05-08 DIAGNOSIS — E1069 Type 1 diabetes mellitus with other specified complication: Secondary | ICD-10-CM | POA: Diagnosis not present

## 2022-05-08 DIAGNOSIS — R931 Abnormal findings on diagnostic imaging of heart and coronary circulation: Secondary | ICD-10-CM

## 2022-05-08 DIAGNOSIS — R002 Palpitations: Secondary | ICD-10-CM | POA: Diagnosis not present

## 2022-05-08 NOTE — Assessment & Plan Note (Signed)
Elevated coronary calcium score of 13 on the RCA territory performed 12/29/2020.  She is completely asymptomatic.  I would like her LDL to be closer to 70 for secondary prevention as a result of this.

## 2022-05-08 NOTE — Assessment & Plan Note (Signed)
History of palpitations with event monitor recently performed revealing occasional PACs, PVCs and short runs of SVT.  Since I saw her in the office 6 weeks ago her palpitations have significantly improved.  She does have a normal 2D echo performed 05/01/2022.  Her palpitations do not seem significant enough to warrant pharmacologic therapy.

## 2022-05-08 NOTE — Progress Notes (Signed)
05/08/2022 Chelsea Mercado   11/02/1960  [id number]  Primary Physician Jarold Motto, PA Primary Cardiologist: Runell Gess MD Milagros Loll, [place], [place]  HPI:  Chelsea Mercado is a 62 y.o.  . 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  She was referred by Jarold Motto, PA-C for palpitations.  I last saw her in the office 03/27/2022.  She saw Dr. Bryan Lemma remotely (03/19/2018) for palpitations as well.  She does admit to drinking caffeine.  She is a type I diabetic and has hyperlipidemia not at goal on high-dose pravastatin and Zetia.  Her mother had bypass surgery in her 22s.  She is never had a heart attack or stroke.  She denies chest pain or shortness of breath.  She may have symptoms of sleep apnea.  She has had palpitations for years that occur weekly and are fairly annoying.  Since I saw her 6 weeks ago her palpitations have significantly improved.  She still drinks 1 cup of coffee in the morning.  She had an event monitor performed 04/29/2022 that revealed occasional PACs, PVCs and short runs of SVT.  2D echo performed 05/01/2022 was essentially normal.  She otherwise is fairly active and asymptomatic.   Current Meds  Medication Sig   ACCU-CHEK GUIDE test strip USE AS INSTRUCTED 3-4 X A DAY   Accu-Chek Softclix Lancets lancets Use as instructed to check blood sugar 4 times daily   amoxicillin-clavulanate (AUGMENTIN) 500-125 MG tablet Take by mouth.   ascorbic acid (VITAMIN C) 500 MG tablet Take 1 tablet by mouth daily.   Blood Glucose Monitoring Suppl (ACCU-CHEK GUIDE) w/Device KIT AccuChek Guide LINK - the only meter approved to communicate with her Medtronic 770G insulin pump   carbidopa-levodopa (SINEMET IR) 25-100 MG tablet TAKE 1 TABLET BY MOUTH THREE TIMES A DAY   Cholecalciferol (VITAMIN D3) 5000 units CAPS Take 1 capsule by mouth daily.    ezetimibe  (ZETIA) 10 MG tablet Take 1 tablet (10 mg total) by mouth daily.   fexofenadine (ALLEGRA) 180 MG tablet Take 180 mg by mouth daily. Taking 1 daily   Glucagon 3 MG/DOSE POWD Place 3 mg into the nose once as needed for up to 1 dose.   HUMALOG 100 UNIT/ML injection USE AS DIRECTED PER PUMP TAKE MAXIMUM 50 UNITS DAILY   Insulin Syringe-Needle U-100 (BD VEO INSULIN SYRINGE U/F) 31G X 15/64" 0.3 ML MISC Use to inject insulin as needed.   levothyroxine (SYNTHROID) 112 MCG tablet Take 1 tablet (112 mcg total) by mouth daily.   pravastatin (PRAVACHOL) 80 MG tablet TAKE 1 TABLET BY MOUTH EVERY DAY   ramipril (ALTACE) 10 MG capsule TAKE 1 CAPSULE BY MOUTH EVERY DAY   tobramycin (TOBREX) 0.3 % ophthalmic ointment Administer to the right eye every 12 (twelve) hours for 7 days   vitamin B-12 (CYANOCOBALAMIN) 500 MCG tablet Take 1,000 mcg by mouth daily.     Allergies  Allergen Reactions   Hydrocodone-Acetaminophen Nausea And Vomiting   Phenergan [Promethazine]     Diagnosed with parkinsons, instructed by neurologist not to take phenergan    Propoxyphene Nausea Only   Tape Rash    Social History   Socioeconomic History   Marital status: Married    Spouse name: Not on file   Number of children: 3   Years of education: 12   Highest education level: High school graduate  Occupational History   Not on file  Tobacco Use   Smoking status: Never   Smokeless tobacco: Never  Vaping Use   Vaping Use: Never used  Substance and Sexual Activity   Alcohol use: No   Drug use: No   Sexual activity: Yes    Partners: Male  Other Topics Concern   Not on file  Social History Narrative   Married mother of 3, grandmother 5.  Lives with her husband and son.   Worked as Agricultural engineer       Social Determinants of Corporate investment banker Strain: Not on file  Food Insecurity: Not on file  Transportation Needs: Not on file  Physical Activity: Not on file  Stress: Not on file  Social Connections:  Not on file  Intimate Partner Violence: Not on file     Review of Systems: General: negative for chills, fever, night sweats or weight changes.  Cardiovascular: negative for chest pain, dyspnea on exertion, edema, orthopnea, palpitations, paroxysmal nocturnal dyspnea or shortness of breath Dermatological: negative for rash Respiratory: negative for cough or wheezing Urologic: negative for hematuria Abdominal: negative for nausea, vomiting, diarrhea, bright red blood per rectum, melena, or hematemesis Neurologic: negative for visual changes, syncope, or dizziness All other systems reviewed and are otherwise negative except as noted above.    Blood pressure 128/62, pulse 77, height 5\' 4"  (1.626 m), weight 180 lb 12.8 oz (82 kg), SpO2 95 %.  General appearance: alert and no distress Neck: no adenopathy, no carotid bruit, no JVD, supple, symmetrical, trachea midline, and thyroid not enlarged, symmetric, no tenderness/mass/nodules Lungs: clear to auscultation bilaterally Heart: regular rate and rhythm, S1, S2 normal, no murmur, click, rub or gallop Extremities: extremities normal, atraumatic, no cyanosis or edema Pulses: 2+ and symmetric Skin: Skin color, texture, turgor normal. No rashes or lesions Neurologic: Grossly normal  EKG not performed today  ASSESSMENT AND PLAN:   Mixed diabetic hyperlipidemia associated with type 1 diabetes mellitus (HCC) History of hyperlipidemia on high-dose pravastatin and Zetia with lipid profile performed 11/22/2021 revealing a total cholesterol of 191, LDL of 113 and HDL of 61, not at goal for secondary prevention given her mildly elevated coronary calcium score.  We will recheck a lipid liver profile.  Elevated coronary artery calcium score Elevated coronary calcium score of 13 on the RCA territory performed 12/29/2020.  She is completely asymptomatic.  I would like her LDL to be closer to 70 for secondary prevention as a result of  this.  Palpitations History of palpitations with event monitor recently performed revealing occasional PACs, PVCs and short runs of SVT.  Since I saw her in the office 6 weeks ago her palpitations have significantly improved.  She does have a normal 2D echo performed 05/01/2022.  Her palpitations do not seem significant enough to warrant pharmacologic therapy.     Runell Gess MD FACP,FACC,FAHA, [facility] 05/08/2022 11:47 AM

## 2022-05-08 NOTE — Assessment & Plan Note (Signed)
History of hyperlipidemia on high-dose pravastatin and Zetia with lipid profile performed 11/22/2021 revealing a total cholesterol of 191, LDL of 113 and HDL of 61, not at goal for secondary prevention given her mildly elevated coronary calcium score.  We will recheck a lipid liver profile.

## 2022-05-08 NOTE — Patient Instructions (Signed)
Medication Instructions:  Your physician recommends that you continue on your current medications as directed. Please refer to the Current Medication list given to you today.  *If you need a refill on your cardiac medications before your next appointment, please call your pharmacy*   Lab Work: Your physician recommends that you return for lab work in: the next week or 2 for FASTING lipid/liver panel  If you have labs (blood work) drawn today and your tests are completely normal, you will receive your results only by: MyChart Message (if you have MyChart) OR A paper copy in the mail If you have any lab test that is abnormal or we need to change your treatment, we will call you to review the results.   Follow-Up: At [facility], you and your health needs are our priority.  As part of our continuing mission to provide you with exceptional heart care, we have created designated Provider Care Teams.  These Care Teams include your primary Cardiologist (physician) and Advanced Practice Providers (APPs -  Physician Assistants and Nurse Practitioners) who all work together to provide you with the care you need, when you need it.  We recommend signing up for the patient portal called "MyChart".  Sign up information is provided on this After Visit Summary.  MyChart is used to connect with patients for Virtual Visits (Telemedicine).  Patients are able to view lab/test results, encounter notes, upcoming appointments, etc.  Non-urgent messages can be sent to your provider as well.   To learn more about what you can do with MyChart, go to [email or web address].    Your next appointment:   6 month(s)  Provider:   Nanetta Batty, MD   Other Instructions Heart-Healthy Eating Plan Many factors influence your heart health, including eating and exercise habits. Heart health is also called coronary health. Coronary risk increases with abnormal blood fat (lipid) levels. A heart-healthy eating plan  includes limiting unhealthy fats, increasing healthy fats, limiting salt (sodium) intake, and making other diet and lifestyle changes. What is my plan? Your health care provider may recommend that: You limit your fat intake to _________% or less of your total calories each day. You limit your saturated fat intake to _________% or less of your total calories each day. You limit the amount of cholesterol in your diet to less than _________ mg per day. You limit the amount of sodium in your diet to less than _________ mg per day. What are tips for following this plan? Cooking Cook foods using methods other than frying. Baking, boiling, grilling, and broiling are all good options. Other ways to reduce fat include: Removing the skin from poultry. Removing all visible fats from meats. Steaming vegetables in water or broth. Meal planning  At meals, imagine dividing your plate into fourths: Fill one-half of your plate with vegetables and green salads. Fill one-fourth of your plate with whole grains. Fill one-fourth of your plate with lean protein foods. Eat 2-4 cups of vegetables per day. One cup of vegetables equals 1 cup (91 g) broccoli or cauliflower florets, 2 medium carrots, 1 large bell pepper, 1 large sweet potato, 1 large tomato, 1 medium white potato, 2 cups (150 g) raw leafy greens. Eat 1-2 cups of fruit per day. One cup of fruit equals 1 small apple, 1 large banana, 1 cup (237 g) mixed fruit, 1 large orange,  cup (82 g) dried fruit, 1 cup (240 mL) 100% fruit juice. Eat more foods that contain soluble fiber. Examples include apples,  broccoli, carrots, beans, peas, and barley. Aim to get 25-30 g of fiber per day. Increase your consumption of legumes, nuts, and seeds to 4-5 servings per week. One serving of dried beans or legumes equals  cup (90 g) cooked, 1 serving of nuts is  oz (12 almonds, 24 pistachios, or 7 walnut halves), and 1 serving of seeds equals  oz (8 g). Fats Choose  healthy fats more often. Choose monounsaturated and polyunsaturated fats, such as olive and canola oils, avocado oil, flaxseeds, walnuts, almonds, and seeds. Eat more omega-3 fats. Choose salmon, mackerel, sardines, tuna, flaxseed oil, and ground flaxseeds. Aim to eat fish at least 2 times each week. Check food labels carefully to identify foods with trans fats or high amounts of saturated fat. Limit saturated fats. These are found in animal products, such as meats, butter, and cream. Plant sources of saturated fats include palm oil, palm kernel oil, and coconut oil. Avoid foods with partially hydrogenated oils in them. These contain trans fats. Examples are stick margarine, some tub margarines, cookies, crackers, and other baked goods. Avoid fried foods. General information Eat more home-cooked food and less restaurant, buffet, and fast food. Limit or avoid alcohol. Limit foods that are high in added sugar and simple starches such as foods made using white refined flour (white breads, pastries, sweets). Lose weight if you are overweight. Losing just 5-10% of your body weight can help your overall health and prevent diseases such as diabetes and heart disease. Monitor your sodium intake, especially if you have high blood pressure. Talk with your health care provider about your sodium intake. Try to incorporate more vegetarian meals weekly. What foods should I eat? Fruits All fresh, canned (in natural juice), or frozen fruits. Vegetables Fresh or frozen vegetables (raw, steamed, roasted, or grilled). Green salads. Grains Most grains. Choose whole wheat and whole grains most of the time. Rice and pasta, including brown rice and pastas made with whole wheat. Meats and other proteins Lean, well-trimmed beef, veal, pork, and lamb. Chicken and [place] without skin. All fish and shellfish. Wild duck, rabbit, pheasant, and venison. Egg whites or low-cholesterol egg substitutes. Dried beans, peas,  lentils, and tofu. Seeds and most nuts. Dairy Low-fat or nonfat cheeses, including ricotta and mozzarella. Skim or 1% milk (liquid, powdered, or evaporated). Buttermilk made with low-fat milk. Nonfat or low-fat yogurt. Fats and oils Non-hydrogenated (trans-free) margarines. Vegetable oils, including soybean, sesame, sunflower, olive, avocado, peanut, safflower, corn, canola, and cottonseed. Salad dressings or mayonnaise made with a vegetable oil. Beverages Water (mineral or sparkling). Coffee and tea. Unsweetened ice tea. Diet beverages. Sweets and desserts Sherbet, gelatin, and fruit ice. Small amounts of dark chocolate. Limit all sweets and desserts. Seasonings and condiments All seasonings and condiments. The items listed above may not be a complete list of foods and beverages you can eat. Contact a dietitian for more options. What foods should I avoid? Fruits Canned fruit in heavy syrup. Fruit in cream or butter sauce. Fried fruit. Limit coconut. Vegetables Vegetables cooked in cheese, cream, or butter sauce. Fried vegetables. Grains Breads made with saturated or trans fats, oils, or whole milk. Croissants. Sweet rolls. Donuts. High-fat crackers, such as cheese crackers and chips. Meats and other proteins Fatty meats, such as hot dogs, ribs, sausage, bacon, rib-eye roast or steak. High-fat deli meats, such as salami and bologna. Caviar. Domestic duck and goose. Organ meats, such as liver. Dairy Cream, sour cream, cream cheese, and creamed cottage cheese. Whole-milk cheeses. Whole or 2%  milk (liquid, evaporated, or condensed). Whole buttermilk. Cream sauce or high-fat cheese sauce. Whole-milk yogurt. Fats and oils Meat fat, or shortening. Cocoa butter, hydrogenated oils, palm oil, coconut oil, palm kernel oil. Solid fats and shortenings, including bacon fat, salt pork, lard, and butter. Nondairy cream substitutes. Salad dressings with cheese or sour cream. Beverages Regular sodas and  any drinks with added sugar. Sweets and desserts Frosting. Pudding. Cookies. Cakes. Pies. Milk chocolate or white chocolate. Buttered syrups. Full-fat ice cream or ice cream drinks. The items listed above may not be a complete list of foods and beverages to avoid. Contact a dietitian for more information. Summary Heart-healthy meal planning includes limiting unhealthy fats, increasing healthy fats, limiting salt (sodium) intake and making other diet and lifestyle changes. Lose weight if you are overweight. Losing just 5-10% of your body weight can help your overall health and prevent diseases such as diabetes and heart disease. Focus on eating a balance of foods, including fruits and vegetables, low-fat or nonfat dairy, lean protein, nuts and legumes, whole grains, and heart-healthy oils and fats. This information is not intended to replace advice given to you by your health care provider. Make sure you discuss any questions you have with your health care provider. Document Revised: 01/30/2021 Document Reviewed: 01/30/2021 Elsevier Patient Education  2023 [employer].

## 2022-05-10 ENCOUNTER — Other Ambulatory Visit: Payer: Self-pay | Admitting: Neurology

## 2022-05-10 DIAGNOSIS — G20A1 Parkinson's disease without dyskinesia, without mention of fluctuations: Secondary | ICD-10-CM

## 2022-05-22 LAB — LIPID PANEL
Chol/HDL Ratio: 2.8 ratio (ref 0.0–4.4)
Cholesterol, Total: 166 mg/dL (ref 100–199)
HDL: 59 mg/dL (ref >39)
LDL Chol Calc (NIH): 94 mg/dL (ref 0–99)
Triglycerides: 64 mg/dL (ref 0–149)
VLDL Cholesterol Cal: 13 mg/dL (ref 5–40)

## 2022-05-22 LAB — HEPATIC FUNCTION PANEL
ALT: 8 IU/L (ref 0–32)
AST: 18 IU/L (ref 0–40)
Albumin: 4.1 g/dL (ref 3.9–4.9)
Alkaline Phosphatase: 68 IU/L (ref 44–121)
Bilirubin Total: 0.4 mg/dL (ref 0.0–1.2)
Bilirubin, Direct: 0.14 mg/dL (ref 0.00–0.40)
Total Protein: 6.6 g/dL (ref 6.0–8.5)

## 2022-05-24 ENCOUNTER — Ambulatory Visit
Admission: RE | Admit: 2022-05-24 | Discharge: 2022-05-24 | Disposition: A | Discharge status: Home / Self Care | Payer: 59 | Source: Ambulatory Visit | Attending: Physician Assistant | Admitting: Physician Assistant

## 2022-05-24 DIAGNOSIS — Z9889 Other specified postprocedural states: Secondary | ICD-10-CM

## 2022-06-07 ENCOUNTER — Ambulatory Visit: Payer: 59 | Admitting: Physician Assistant

## 2022-06-07 ENCOUNTER — Encounter: Payer: Self-pay | Admitting: Physician Assistant

## 2022-06-07 VITALS — BP 126/70 | HR 80 | Temp 97.5°F | Ht 64.0 in | Wt 174.5 lb

## 2022-06-07 DIAGNOSIS — R234 Changes in skin texture: Secondary | ICD-10-CM

## 2022-06-07 DIAGNOSIS — L608 Other nail disorders: Secondary | ICD-10-CM | POA: Diagnosis not present

## 2022-06-07 MED ORDER — KETOCONAZOLE 2 % EX CREA: TOPICAL_CREAM | CUTANEOUS | 0 refills | Status: DC

## 2022-06-07 NOTE — Progress Notes (Signed)
Chelsea Mercado is a 62 y.o. female here for a new problem.  History of Present Illness:   Chief Complaint  Patient presents with   Sore    Pt c/o open sore on right pinky toe underneath skin is cracked, x 1-2 weeks.   Nail Problem    Pt c/o middle toe on left foot nail discolored.    HPI  Toenail discoloration Has slightly dark spot near cuticle under nail on toenail of left foot, middle toe Had some fungus here but was treated Now concerned about this discoloration No pain/tenderness   Cracked skin on feet Has a crack at the base of right 5th toe on bottom of foot Feels like she is not able to keep her feet moisturized enough Denies purulent drainage, fever, chills, pain Currently dealing with plantar fasciitis History of athletes feet  Past Medical History:  Diagnosis Date   Anemia    Arthritis    Controlled type 1 diabetes mellitus without complication (HCC) 06/22/2013   Hyperlipidemia 06/22/2013   Obesity, Class I, BMI 30-34.9 06/22/2013   Parkinson disease    PONV (postoperative nausea and vomiting)    Post menopausal syndrome 06/22/2013   Primary hypothyroidism 06/22/2013   Vitamin D deficiency 09/24/2013     Social History   Tobacco Use   Smoking status: Never   Smokeless tobacco: Never  Vaping Use   Vaping Use: Never used  Substance Use Topics   Alcohol use: No   Drug use: No    Past Surgical History:  Procedure Laterality Date   APPENDECTOMY     BREAST EXCISIONAL BIOPSY Left    papilloma   BREAST LUMPECTOMY WITH RADIOACTIVE SEED LOCALIZATION Left 06/10/2018   Procedure: LEFT BREAST LUMPECTOMY WITH RADIOACTIVE SEED LOCALIZATION;  Surgeon: Harriette Bouillon, MD;  Location: [facility];  Service: General;  Laterality: Left;   BREAST LUMPECTOMY WITH RADIOACTIVE SEED LOCALIZATION Right 04/21/2020   Procedure: RIGHT BREAST LUMPECTOMY WITH RADIOACTIVE SEED LOCALIZATION;  Surgeon: Harriette Bouillon, MD;  Location: [facility];   Service: General;  Laterality: Right;   CESAREAN SECTION  1985   TONSILLECTOMY     torn menisicus Left     Family History  Problem Relation Age of Onset   Hypertension Mother    High Cholesterol Mother    Coronary artery disease Mother 38       CABG   Tuberculosis Father    Hyperlipidemia Sister    Hypertension Sister    Hypothyroidism Sister    Other Sister        brain tumor; unsure if cancer   Hypothyroidism Sister    Heart attack Maternal Grandfather        Died from MI   Tuberculosis Paternal Grandmother    Cancer Paternal Grandfather    Coronary artery disease Maternal Uncle 17       CABG   Lung cancer Maternal Uncle        asbestosis   CAD Maternal Uncle 35       died - coronary aneurysm    Allergies  Allergen Reactions   Hydrocodone-Acetaminophen Nausea And Vomiting   Phenergan [Promethazine]     Diagnosed with parkinsons, instructed by neurologist not to take phenergan    Propoxyphene Nausea Only   Tape Rash    Current Medications:   Current Outpatient Medications:    ACCU-CHEK GUIDE test strip, USE AS INSTRUCTED 3-4 X A DAY, Disp: 400 strip, Rfl: 0   Accu-Chek Softclix Lancets lancets, Use  as instructed to check blood sugar 4 times daily, Disp: 400 each, Rfl: 3   Blood Glucose Monitoring Suppl (ACCU-CHEK GUIDE) w/Device KIT, AccuChek Guide LINK - the only meter approved to communicate with her Medtronic 770G insulin pump, Disp: 1 kit, Rfl: 0   carbidopa-levodopa (SINEMET IR) 25-100 MG tablet, TAKE 1 TABLET BY MOUTH THREE TIMES A DAY, Disp: 270 tablet, Rfl: 0   Cholecalciferol (VITAMIN D3) 5000 units CAPS, Take 1 capsule by mouth daily. , Disp: , Rfl:    ezetimibe (ZETIA) 10 MG tablet, Take 1 tablet (10 mg total) by mouth daily., Disp: 90 tablet, Rfl: 3   fexofenadine (ALLEGRA) 180 MG tablet, Take 180 mg by mouth daily. Taking 1 daily, Disp: , Rfl:    Glucagon 3 MG/DOSE POWD, Place 3 mg into the nose once as needed for up to 1 dose., Disp: 1 each, Rfl: 11    HUMALOG 100 UNIT/ML injection, USE AS DIRECTED PER PUMP TAKE MAXIMUM 50 UNITS DAILY, Disp: 50 mL, Rfl: 1   Insulin Syringe-Needle U-100 (BD VEO INSULIN SYRINGE U/F) 31G X 15/64" 0.3 ML MISC, Use to inject insulin as needed., Disp: 10 each, Rfl: 5   levothyroxine (SYNTHROID) 112 MCG tablet, Take 1 tablet (112 mcg total) by mouth daily., Disp: 45 tablet, Rfl: 3   pravastatin (PRAVACHOL) 80 MG tablet, TAKE 1 TABLET BY MOUTH EVERY DAY, Disp: 90 tablet, Rfl: 3   ramipril (ALTACE) 10 MG capsule, TAKE 1 CAPSULE BY MOUTH EVERY DAY, Disp: 90 capsule, Rfl: 3   vitamin B-12 (CYANOCOBALAMIN) 500 MCG tablet, Take 1,000 mcg by mouth daily., Disp: , Rfl:    Review of Systems:   ROS Negative unless otherwise specified per HPI.  Vitals:   Vitals:   06/07/22 1128  BP: 126/70  Pulse: 80  Temp: (!) 97.5 F (36.4 C)  TempSrc: Temporal  SpO2: 97%  Weight: 174 lb 8 oz (79.2 kg)  Height: 5\' 4"  (1.626 m)     Body mass index is 29.95 kg/m.  Physical Exam:   Physical Exam Constitutional:      Appearance: Normal appearance. She is well-developed.  HENT:     Head: Normocephalic and atraumatic.  Eyes:     General: Lids are normal.     Extraocular Movements: Extraocular movements intact.     Conjunctiva/sclera: Conjunctivae normal.  Pulmonary:     Effort: Pulmonary effort is normal.  Musculoskeletal:        General: Normal range of motion.     Cervical back: Normal range of motion and neck supple.  Feet:     Comments: Left foot: middle toenail with darkened appearance of nail bed at proximal aspect of nail  Right foot: plantar aspect of base of fifth toe with approximately 3mm fissure; no erythema or discharge, no tenderness; dry and cracked interdigital space of toes Skin:    General: Skin is warm and dry.  Neurological:     Mental Status: She is alert and oriented to person, place, and time.  Psychiatric:        Attention and Perception: Attention and perception normal.        Mood and  Affect: Mood normal.        Behavior: Behavior normal.        Thought Content: Thought content normal.        Judgment: Judgment normal.     Assessment and Plan:   Change in nail appearance Unclear etiology She is established with a dermatologist -- I recommended that  she reach out to them If unable to be evaulated for this, I asked her to reach out to [place] and let us know  Cracked skin on feet No red flags on exam No evidence of systemic symptoms No need for antibiotic(s) Feet are quite dry -- recommend topical ketaconazole to area given history of fungus and adequate moisturizer  Follow-up if new/worsening symptom(s)     Jarold Motto, PA-C

## 2022-06-07 NOTE — Patient Instructions (Signed)
It was great to see you!  Call your dermatologist and see if they can see you for your toenail change Let me know if not  Start fungal cream and excellent moisturizing to your feet Follow-up if any concerns  Take care,  Jarold Motto PA-C

## 2022-07-26 ENCOUNTER — Ambulatory Visit: Payer: 59 | Admitting: Family

## 2022-07-26 VITALS — BP 114/68 | HR 83 | Temp 96.5°F | Wt 177.2 lb

## 2022-07-26 DIAGNOSIS — L02413 Cutaneous abscess of right upper limb: Secondary | ICD-10-CM

## 2022-07-26 MED ORDER — DOXYCYCLINE HYCLATE 100 MG PO TABS
100.0000 mg | ORAL_TABLET | Freq: Two times a day (BID) | ORAL | 0 refills | Status: AC
Start: 2022-07-26 — End: 2022-08-02

## 2022-07-26 NOTE — Progress Notes (Signed)
Patient ID: Chelsea Mercado, female    DOB: 1960-05-31, 62 y.o.   MRN: [id number]  No chief complaint on file.   HPI:      Abscess:  right posterior forearm near elbow, very inflamed, redness, pain. Pt reports applying warm compresses for a couple of days and then it spontaneously opened, she tried to express some pus, with blood.      Assessment & Plan:  1. Cutaneous abscess of right upper extremity sending DOXY, advised on use & SE. Advised to continue to apply warm compresses, if it starts to drain again, gently apply pressure to express, wash with soap & water well, ok to apply a very thin amount of triple abt ointment & cover until no longer draining.  - doxycycline (VIBRA-TABS) 100 MG tablet; Take 1 tablet (100 mg total) by mouth 2 (two) times daily for 7 days.  Dispense: 14 tablet; Refill: 0   Subjective:    Outpatient Medications Prior to Visit  Medication Sig Dispense Refill   ACCU-CHEK GUIDE test strip USE AS INSTRUCTED 3-4 X A DAY 400 strip 0   Accu-Chek Softclix Lancets lancets Use as instructed to check blood sugar 4 times daily 400 each 3   Blood Glucose Monitoring Suppl (ACCU-CHEK GUIDE) w/Device KIT AccuChek Guide LINK - the only meter approved to communicate with her Medtronic 770G insulin pump 1 kit 0   carbidopa-levodopa (SINEMET IR) 25-100 MG tablet TAKE 1 TABLET BY MOUTH THREE TIMES A DAY 270 tablet 0   Cholecalciferol (VITAMIN D3) 5000 units CAPS Take 1 capsule by mouth daily.      ezetimibe (ZETIA) 10 MG tablet Take 1 tablet (10 mg total) by mouth daily. 90 tablet 3   fexofenadine (ALLEGRA) 180 MG tablet Take 180 mg by mouth daily. Taking 1 daily     Glucagon 3 MG/DOSE POWD Place 3 mg into the nose once as needed for up to 1 dose. 1 each 11   HUMALOG 100 UNIT/ML injection USE AS DIRECTED PER PUMP TAKE MAXIMUM 50 UNITS DAILY 50 mL 1   Insulin Syringe-Needle U-100 (BD VEO INSULIN SYRINGE U/F) 31G X 15/64" 0.3 ML MISC Use to inject insulin as needed. 10 each 5    ketoconazole (NIZORAL) 2 % cream Apply to affected area 1-2 times daily 15 g 0   levothyroxine (SYNTHROID) 112 MCG tablet Take 1 tablet (112 mcg total) by mouth daily. 45 tablet 3   pravastatin (PRAVACHOL) 80 MG tablet TAKE 1 TABLET BY MOUTH EVERY DAY 90 tablet 3   ramipril (ALTACE) 10 MG capsule TAKE 1 CAPSULE BY MOUTH EVERY DAY 90 capsule 3   vitamin B-12 (CYANOCOBALAMIN) 500 MCG tablet Take 1,000 mcg by mouth daily.     No facility-administered medications prior to visit.   Past Medical History:  Diagnosis Date   Anemia    Arthritis    Controlled type 1 diabetes mellitus without complication (HCC) 06/22/2013   Hyperlipidemia 06/22/2013   Obesity, Class I, BMI 30-34.9 06/22/2013   Parkinson disease    PONV (postoperative nausea and vomiting)    Post menopausal syndrome 06/22/2013   Primary hypothyroidism 06/22/2013   Vitamin D deficiency 09/24/2013   Past Surgical History:  Procedure Laterality Date   APPENDECTOMY     BREAST EXCISIONAL BIOPSY Left    papilloma   BREAST LUMPECTOMY WITH RADIOACTIVE SEED LOCALIZATION Left 06/10/2018   Procedure: LEFT BREAST LUMPECTOMY WITH RADIOACTIVE SEED LOCALIZATION;  Surgeon: Harriette Bouillon, MD;  Location: [facility];  Service: General;  Laterality: Left;   BREAST LUMPECTOMY WITH RADIOACTIVE SEED LOCALIZATION Right 04/21/2020   Procedure: RIGHT BREAST LUMPECTOMY WITH RADIOACTIVE SEED LOCALIZATION;  Surgeon: Harriette Bouillon, MD;  Location: [facility];  Service: General;  Laterality: Right;   CESAREAN SECTION  1985   TONSILLECTOMY     torn menisicus Left    Allergies  Allergen Reactions   Hydrocodone-Acetaminophen Nausea And Vomiting   Phenergan [Promethazine]     Diagnosed with parkinsons, instructed by neurologist not to take phenergan    Propoxyphene Nausea Only   Tape Rash      Objective:    Physical Exam Vitals and nursing note reviewed.  Constitutional:      Appearance: Normal appearance.   Cardiovascular:     Rate and Rhythm: Normal rate and regular rhythm.  Pulmonary:     Effort: Pulmonary effort is normal.     Breath sounds: Normal breath sounds.  Musculoskeletal:        General: Normal range of motion.  Skin:    General: Skin is warm and dry.     Findings: Abscess (right posterior forearm, approx 4cm in diameter, raised cyst, with pinpoint opening with dried pus, erythema noted all over cyst & 1.5cm in surrounding skin, warm to touch) present.       Neurological:     Mental Status: She is alert.  Psychiatric:        Mood and Affect: Mood normal.        Behavior: Behavior normal.    There were no vitals taken for this visit. Wt Readings from Last 3 Encounters:  06/07/22 174 lb 8 oz (79.2 kg)  05/08/22 180 lb 12.8 oz (82 kg)  04/13/22 181 lb 12.8 oz (82.5 kg)       Dulce Sellar, NP

## 2022-08-20 ENCOUNTER — Other Ambulatory Visit: Payer: Self-pay | Admitting: Neurology

## 2022-08-20 DIAGNOSIS — G20A1 Parkinson's disease without dyskinesia, without mention of fluctuations: Secondary | ICD-10-CM

## 2022-10-22 NOTE — Progress Notes (Unsigned)
All   Assessment/Plan:   1.  Parkinsons Disease  -Continue carbidopa/levodopa 25/100, 1 tablet 3 times per day.    -discussed exercise  -We discussed that it used to be thought that levodopa would increase risk of melanoma but now it is believed that Parkinsons itself likely increases risk of melanoma. she is to get regular skin checks.  Following with [facility] dermatology  2.  ? Intermittent myoclonus  -none seen today but she describes a few episodes of this.  I did not see any medications that should cause this on her list.  Her kidney function is essentially normal.  I told her to just keep an eye on it and see if she has further episodes.  3.  Palpitations  -Cardiology workup was negative and they felt no medications were needed  Subjective:   Chelsea Mercado was seen today in follow up for Parkinsons disease.  My previous records were reviewed prior to todays visit as well as outside records available to me.  She saw Dr. Allyson Sabal, cardiology, since last visit and at that point in time, palpitations had improved and she had a normal echocardiogram.  She had no falls since last visit.  She has had no near syncope.  Current prescribed movement disorder medications: Carbidopa/levodopa 25/100, 1 tablet 3 times per day    ALLERGIES:   Allergies  Allergen Reactions   Hydrocodone-Acetaminophen Nausea And Vomiting   Phenergan [Promethazine]     Diagnosed with parkinsons, instructed by neurologist not to take phenergan    Propoxyphene Nausea Only   Tape Rash    CURRENT MEDICATIONS:  Outpatient Encounter Medications as of 10/23/2022  Medication Sig   ACCU-CHEK GUIDE test strip USE AS INSTRUCTED 3-4 X A DAY   Accu-Chek Softclix Lancets lancets Use as instructed to check blood sugar 4 times daily   Blood Glucose Monitoring Suppl (ACCU-CHEK GUIDE) w/Device KIT AccuChek Guide LINK - the only meter approved to communicate with her Medtronic 770G insulin pump   carbidopa-levodopa (SINEMET  IR) 25-100 MG tablet TAKE 1 TABLET BY MOUTH THREE TIMES A DAY   Cholecalciferol (VITAMIN D3) 5000 units CAPS Take 1 capsule by mouth daily.    ezetimibe (ZETIA) 10 MG tablet Take 1 tablet (10 mg total) by mouth daily.   fexofenadine (ALLEGRA) 180 MG tablet Take 180 mg by mouth daily. Taking 1 daily   Glucagon 3 MG/DOSE POWD Place 3 mg into the nose once as needed for up to 1 dose.   HUMALOG 100 UNIT/ML injection USE AS DIRECTED PER PUMP TAKE MAXIMUM 50 UNITS DAILY   Insulin Syringe-Needle U-100 (BD VEO INSULIN SYRINGE U/F) 31G X 15/64" 0.3 ML MISC Use to inject insulin as needed.   ketoconazole (NIZORAL) 2 % cream Apply to affected area 1-2 times daily   levothyroxine (SYNTHROID) 112 MCG tablet Take 1 tablet (112 mcg total) by mouth daily.   pravastatin (PRAVACHOL) 80 MG tablet TAKE 1 TABLET BY MOUTH EVERY DAY   ramipril (ALTACE) 10 MG capsule TAKE 1 CAPSULE BY MOUTH EVERY DAY   vitamin B-12 (CYANOCOBALAMIN) 500 MCG tablet Take 1,000 mcg by mouth daily.   No facility-administered encounter medications on file as of 10/23/2022.    Objective:   PHYSICAL EXAMINATION:    VITALS:   There were no vitals filed for this visit.  Wt Readings from Last 3 Encounters:  07/26/22 177 lb 3.2 oz (80.4 kg)  06/07/22 174 lb 8 oz (79.2 kg)  05/08/22 180 lb 12.8 oz (82 kg)  GEN:  The patient appears stated age and is in NAD. HEENT:  Normocephalic, atraumatic.  The mucous membranes are moist. The superficial temporal arteries are without ropiness or tenderness. Cardiovascular: Regular rate rhythm Lungs: Clear to auscultation bilaterally Neck: No bruits   Neurological examination:  Orientation: The patient is alert and oriented x3. Cranial nerves: There is good facial symmetry with mild facial hypomimia. EOMI.  The speech is fluent and clear. Soft palate rises symmetrically and there is no tongue deviation. Hearing is intact to conversational tone. Sensation: Sensation is intact to light touch  throughout Motor: Strength is at least antigravity x4.  Movement examination:  Tone: There is nl tone in the UE/LE Abnormal movements: there is no tremor Coordination:  There is no decremation, with any form of RAMS, including alternating supination and pronation of the forearm, hand opening and closing, finger taps, heel taps and toe taps. Gait and Station: The patient has no difficulty arising out of a deep-seated chair without the use of the hands. The patient's stride length is good.  She has good armswing.  I have reviewed and interpreted the following labs independently    Chemistry      Component Value Date/Time   NA 139 03/07/2022 1036   K 4.6 03/07/2022 1036   CL 102 03/07/2022 1036   CO2 31 03/07/2022 1036   BUN 12 03/07/2022 1036   CREATININE 0.88 03/07/2022 1036   CREATININE 0.83 09/28/2020 1058      Component Value Date/Time   CALCIUM 9.4 03/07/2022 1036   ALKPHOS 68 05/21/2022 0824   AST 18 05/21/2022 0824   ALT 8 05/21/2022 0824   BILITOT 0.4 05/21/2022 0824       Lab Results  Component Value Date   WBC 6.5 03/07/2022   HGB 14.0 03/07/2022   HCT 41.4 03/07/2022   MCV 89.4 03/07/2022   PLT 313.0 03/07/2022    Lab Results  Component Value Date   TSH 1.11 03/07/2022   Total time spent on today's visit was *** minutes, including both face-to-face time and nonface-to-face time.  Time included that spent on review of records (prior notes available to me/labs/imaging if pertinent), discussing treatment and goals, answering patient's questions and coordinating care.    Cc:  Jarold Motto, PA

## 2022-10-23 ENCOUNTER — Ambulatory Visit: Payer: 59 | Admitting: Neurology

## 2022-10-23 ENCOUNTER — Encounter: Payer: Self-pay | Admitting: Neurology

## 2022-10-23 VITALS — BP 129/83 | HR 88 | Ht 64.0 in | Wt 178.0 lb

## 2022-10-23 DIAGNOSIS — G20A1 Parkinson's disease without dyskinesia, without mention of fluctuations: Secondary | ICD-10-CM | POA: Diagnosis not present

## 2022-10-23 DIAGNOSIS — R002 Palpitations: Secondary | ICD-10-CM

## 2022-10-23 DIAGNOSIS — R4702 Dysphasia: Secondary | ICD-10-CM

## 2022-11-06 LAB — HEMOGLOBIN A1C: Hemoglobin A1C: 7.9

## 2022-11-13 ENCOUNTER — Other Ambulatory Visit: Payer: 59

## 2022-11-19 NOTE — Progress Notes (Signed)
Chelsea Mercado is a 62 y.o. female {ELNP/FU:31256} History of Present Illness:  No chief complaint on file.  HPI Diarrhea: {ELStarters:31163} *** *** *** *** *** ***  *** *** *** *** *** Past Medical History:  Diagnosis Date   Anemia    Arthritis    Controlled type 1 diabetes mellitus without complication (HCC) 06/22/2013   Hyperlipidemia 06/22/2013   Obesity, Class I, BMI 30-34.9 06/22/2013   Parkinson disease (HCC)    PONV (postoperative nausea and vomiting)    Post menopausal syndrome 06/22/2013   Primary hypothyroidism 06/22/2013   Vitamin D deficiency 09/24/2013    Social History   Tobacco Use   Smoking status: Never   Smokeless tobacco: Never  Vaping Use   Vaping status: Never Used  Substance Use Topics   Alcohol use: No   Drug use: No   Past Surgical History:  Procedure Laterality Date   APPENDECTOMY     BREAST EXCISIONAL BIOPSY Left    papilloma   BREAST LUMPECTOMY WITH RADIOACTIVE SEED LOCALIZATION Left 06/10/2018   Procedure: LEFT BREAST LUMPECTOMY WITH RADIOACTIVE SEED LOCALIZATION;  Surgeon: Harriette Bouillon, MD;  Location: [facility];  Service: General;  Laterality: Left;   BREAST LUMPECTOMY WITH RADIOACTIVE SEED LOCALIZATION Right 04/21/2020   Procedure: RIGHT BREAST LUMPECTOMY WITH RADIOACTIVE SEED LOCALIZATION;  Surgeon: Harriette Bouillon, MD;  Location: [facility];  Service: General;  Laterality: Right;   CESAREAN SECTION  1985   TONSILLECTOMY     torn menisicus Left    Family History  Problem Relation Age of Onset   Hypertension Mother    High Cholesterol Mother    Coronary artery disease Mother 71       CABG   Tuberculosis Father    Hyperlipidemia Sister    Hypertension Sister    Hypothyroidism Sister    Other Sister        brain tumor; unsure if cancer   Hypothyroidism Sister    Heart attack Maternal Grandfather        Died from MI   Tuberculosis Paternal Grandmother    Cancer Paternal Grandfather     Coronary artery disease Maternal Uncle 71       CABG   Lung cancer Maternal Uncle        asbestosis   CAD Maternal Uncle 35       died - coronary aneurysm   Allergies  Allergen Reactions   Hydrocodone-Acetaminophen Nausea And Vomiting   Phenergan [Promethazine]     Diagnosed with parkinsons, instructed by neurologist not to take phenergan    Propoxyphene Nausea Only   Tape Rash   Current Medications:   Current Outpatient Medications:    ACCU-CHEK GUIDE test strip, USE AS INSTRUCTED 3-4 X A DAY, Disp: 400 strip, Rfl: 0   Accu-Chek Softclix Lancets lancets, Use as instructed to check blood sugar 4 times daily, Disp: 400 each, Rfl: 3   Blood Glucose Monitoring Suppl (ACCU-CHEK GUIDE) w/Device KIT, AccuChek Guide LINK - the only meter approved to communicate with her Medtronic 770G insulin pump, Disp: 1 kit, Rfl: 0   carbidopa-levodopa (SINEMET IR) 25-100 MG tablet, TAKE 1 TABLET BY MOUTH THREE TIMES A DAY, Disp: 270 tablet, Rfl: 0   Cholecalciferol (VITAMIN D3) 5000 units CAPS, Take 1 capsule by mouth daily. , Disp: , Rfl:    ezetimibe (ZETIA) 10 MG tablet, Take 1 tablet (10 mg total) by mouth daily., Disp: 90 tablet, Rfl: 3   fexofenadine (ALLEGRA) 180 MG tablet, Take 180  mg by mouth daily. Taking 1 daily, Disp: , Rfl:    Glucagon 3 MG/DOSE POWD, Place 3 mg into the nose once as needed for up to 1 dose., Disp: 1 each, Rfl: 11   HUMALOG 100 UNIT/ML injection, USE AS DIRECTED PER PUMP TAKE MAXIMUM 50 UNITS DAILY, Disp: 50 mL, Rfl: 1   Insulin Syringe-Needle U-100 (BD VEO INSULIN SYRINGE U/F) 31G X 15/64" 0.3 ML MISC, Use to inject insulin as needed., Disp: 10 each, Rfl: 5   ketoconazole (NIZORAL) 2 % cream, Apply to affected area 1-2 times daily, Disp: 15 g, Rfl: 0   levothyroxine (SYNTHROID) 112 MCG tablet, Take 1 tablet (112 mcg total) by mouth daily., Disp: 45 tablet, Rfl: 3   pravastatin (PRAVACHOL) 80 MG tablet, TAKE 1 TABLET BY MOUTH EVERY DAY, Disp: 90 tablet, Rfl: 3   ramipril  (ALTACE) 10 MG capsule, TAKE 1 CAPSULE BY MOUTH EVERY DAY, Disp: 90 capsule, Rfl: 3   vitamin B-12 (CYANOCOBALAMIN) 500 MCG tablet, Take 1,000 mcg by mouth daily., Disp: , Rfl:   Review of Systems:   ROS See pertinent positives and negatives as per the HPI.  Vitals:   There were no vitals filed for this visit.   There is no height or weight on file to calculate BMI.  Physical Exam:   Physical Exam  Assessment and Plan:   There are no diagnoses linked to this encounter.            I,Emily Lagle,acting as a Neurosurgeon for [employer], PA.,have documented all relevant documentation on the behalf of Jarold Motto, PA,as directed by  Jarold Motto, PA while in the presence of Jarold Motto, [place].  *** (refresh reminder)  I, Jarold Motto, PA, have reviewed all documentation for this visit. The documentation on 11/19/22 for the exam, diagnosis, procedures, and orders are all accurate and complete.  Jarold Motto, PA-C

## 2022-11-20 ENCOUNTER — Ambulatory Visit: Payer: 59 | Admitting: Physician Assistant

## 2022-11-20 ENCOUNTER — Encounter: Payer: Self-pay | Admitting: Physician Assistant

## 2022-11-20 VITALS — BP 112/70 | HR 79 | Temp 97.5°F | Ht 64.0 in | Wt 180.4 lb

## 2022-11-20 DIAGNOSIS — R194 Change in bowel habit: Secondary | ICD-10-CM

## 2022-11-20 LAB — CBC WITH DIFFERENTIAL/PLATELET
Basophils Absolute: 0.1 10*3/uL (ref 0.0–0.1)
Basophils Relative: 1 % (ref 0.0–3.0)
Eosinophils Absolute: 0.2 10*3/uL (ref 0.0–0.7)
Eosinophils Relative: 3.5 % (ref 0.0–5.0)
HCT: 41.3 % (ref 36.0–46.0)
Hemoglobin: 14 g/dL (ref 12.0–15.0)
Lymphocytes Relative: 32.1 % (ref 12.0–46.0)
Lymphs Abs: 2 10*3/uL (ref 0.7–4.0)
MCHC: 34 g/dL (ref 30.0–36.0)
MCV: 90.1 fL (ref 78.0–100.0)
Monocytes Absolute: 0.4 10*3/uL (ref 0.1–1.0)
Monocytes Relative: 6.3 % (ref 3.0–12.0)
Neutro Abs: 3.5 10*3/uL (ref 1.4–7.7)
Neutrophils Relative %: 57.1 % (ref 43.0–77.0)
Platelets: 263 10*3/uL (ref 150.0–400.0)
RBC: 4.58 Mil/uL (ref 3.87–5.11)
RDW: 13.4 % (ref 11.5–15.5)
WBC: 6.1 10*3/uL (ref 4.0–10.5)

## 2022-11-20 LAB — TSH: TSH: 0.69 u[IU]/mL (ref 0.35–5.50)

## 2022-11-20 LAB — COMPREHENSIVE METABOLIC PANEL
ALT: 3 U/L (ref 0–35)
AST: 20 U/L (ref 0–37)
Albumin: 4 g/dL (ref 3.5–5.2)
Alkaline Phosphatase: 55 U/L (ref 39–117)
BUN: 14 mg/dL (ref 6–23)
CO2: 29 meq/L (ref 19–32)
Calcium: 9.4 mg/dL (ref 8.4–10.5)
Chloride: 103 meq/L (ref 96–112)
Creatinine, Ser: 0.86 mg/dL (ref 0.40–1.20)
GFR: 72.46 mL/min (ref >60.00)
Glucose, Bld: 74 mg/dL (ref 70–99)
Potassium: 4.4 meq/L (ref 3.5–5.1)
Sodium: 140 meq/L (ref 135–145)
Total Bilirubin: 0.4 mg/dL (ref 0.2–1.2)
Total Protein: 7 g/dL (ref 6.0–8.3)

## 2022-11-20 LAB — LIPASE: Lipase: 13 U/L (ref 11.0–59.0)

## 2022-11-20 NOTE — Patient Instructions (Signed)
It was great to see you!  Continue to work on bland foods as able Consider OTC (available over the counter without a prescription) probiotic for your gut health  Keep me posted on symptom(s), I will be in touch with results as I get them  Take care,  Jarold Motto PA-C

## 2022-11-21 ENCOUNTER — Encounter: Payer: Self-pay | Admitting: Physician Assistant

## 2022-11-21 LAB — H. PYLORI BREATH TEST: H. pylori Breath Test: NOT DETECTED

## 2022-11-22 ENCOUNTER — Other Ambulatory Visit: Payer: Self-pay | Admitting: Physician Assistant

## 2022-11-22 DIAGNOSIS — R194 Change in bowel habit: Secondary | ICD-10-CM

## 2022-11-23 ENCOUNTER — Other Ambulatory Visit: Payer: 59

## 2022-11-23 NOTE — Addendum Note (Signed)
Addended by: Lorn Junes on: 11/23/2022 10:28 AM   Modules accepted: Orders

## 2022-11-26 ENCOUNTER — Encounter: Payer: Self-pay | Admitting: Physician Assistant

## 2022-11-26 ENCOUNTER — Other Ambulatory Visit: Payer: Self-pay | Admitting: Neurology

## 2022-11-26 ENCOUNTER — Ambulatory Visit (INDEPENDENT_AMBULATORY_CARE_PROVIDER_SITE_OTHER): Payer: 59 | Admitting: Physician Assistant

## 2022-11-26 VITALS — BP 130/70 | HR 88 | Temp 97.7°F | Ht 62.5 in | Wt 180.0 lb

## 2022-11-26 DIAGNOSIS — E559 Vitamin D deficiency, unspecified: Secondary | ICD-10-CM | POA: Diagnosis not present

## 2022-11-26 DIAGNOSIS — E782 Mixed hyperlipidemia: Secondary | ICD-10-CM

## 2022-11-26 DIAGNOSIS — Z0000 Encounter for general adult medical examination without abnormal findings: Secondary | ICD-10-CM

## 2022-11-26 DIAGNOSIS — E669 Obesity, unspecified: Secondary | ICD-10-CM | POA: Diagnosis not present

## 2022-11-26 DIAGNOSIS — E1069 Type 1 diabetes mellitus with other specified complication: Secondary | ICD-10-CM

## 2022-11-26 DIAGNOSIS — Z23 Encounter for immunization: Secondary | ICD-10-CM | POA: Diagnosis not present

## 2022-11-26 DIAGNOSIS — E039 Hypothyroidism, unspecified: Secondary | ICD-10-CM

## 2022-11-26 DIAGNOSIS — E1065 Type 1 diabetes mellitus with hyperglycemia: Secondary | ICD-10-CM | POA: Diagnosis not present

## 2022-11-26 DIAGNOSIS — G20A1 Parkinson's disease without dyskinesia, without mention of fluctuations: Secondary | ICD-10-CM

## 2022-11-26 LAB — VITAMIN D 25 HYDROXY (VIT D DEFICIENCY, FRACTURES): VITD: 53.86 ng/mL (ref 30.00–100.00)

## 2022-11-26 LAB — LIPID PANEL
Cholesterol: 158 mg/dL (ref 0–200)
HDL: 57.5 mg/dL (ref >39.00)
LDL Cholesterol: 86 mg/dL (ref 0–99)
NonHDL: 100.8
Total CHOL/HDL Ratio: 3
Triglycerides: 75 mg/dL (ref 0.0–149.0)
VLDL: 15 mg/dL (ref 0.0–40.0)

## 2022-11-26 LAB — MICROALBUMIN / CREATININE URINE RATIO
Creatinine,U: 143.8 mg/dL
Microalb Creat Ratio: 0.7 mg/g (ref 0.0–30.0)
Microalb, Ur: 1 mg/dL (ref 0.0–1.9)

## 2022-11-26 LAB — TSH: TSH: 0.67 u[IU]/mL (ref 0.35–5.50)

## 2022-11-26 NOTE — Progress Notes (Signed)
Chelsea Mercado is a 62 y.o. female and is here for a comprehensive physical exam.  HPI Health Maintenance Due  Topic Date Due   HEMOGLOBIN A1C  04/04/2022   Diabetic kidney evaluation - Urine ACR  11/23/2022   Acute Concerns: None  Chronic Issues: Diabetes Sees [facility]. She has insulin pump She is planning see another diabetic coordinator, her prior one retired Patient is compliant with medications.  Denies: hypoglycemic or hyperglycemic episodes or symptoms. This patient's diabetes is complicated by HLD. She takes ramipril 10 mg.   Most recent A1c 7.9% -- done two weeks ago.  HLD Currently taking pravastatin 80 mg twice daily and zetia 10 mg daily Would like to come off of zetia  Parkinsons Continues with Dr Arbutus Leas at [facility] Planning to get MRI tomorrow to update - had episode recently where she felt like she wasn't communicating normally when speaking with neighbor Continues on Sinemet IR 25-100 mg three times daily   Health Maintenance: Immunizations -- UpToDate  Cologuard-- Last done 12/05/20. Results were normal. Repeat 2025. Mammogram-- Last done 05/24/22. Results were normal. Repeat 2025. PAP-- Last done 12/31/18. Results were normal. Repeat 2025. Bone Density-- N/A Diet -- Healthy overall -- occasional sweets Exercise -- Regular exercise  Sleep habits -- Stable Mood -- Stable  UTD with dentist? - Yes UTD with eye doctor? - Yes Established/UTD with dermatology? - yes  Weight history: Wt Readings from Last 10 Encounters:  11/26/22 180 lb (81.6 kg)  11/20/22 180 lb 6.1 oz (81.8 kg)  10/23/22 178 lb (80.7 kg)  07/26/22 177 lb 3.2 oz (80.4 kg)  06/07/22 174 lb 8 oz (79.2 kg)  05/08/22 180 lb 12.8 oz (82 kg)  04/13/22 181 lb 12.8 oz (82.5 kg)  03/27/22 181 lb 3.2 oz (82.2 kg)  03/07/22 179 lb 8 oz (81.4 kg)  02/19/22 178 lb (80.7 kg)   Body mass index is 32.4 kg/m. No LMP recorded. Patient is postmenopausal.  Alcohol use:  reports no history of  alcohol use.  Tobacco use:  Tobacco Use: Low Risk  (11/26/2022)   Patient History    Smoking Tobacco Use: Never    Smokeless Tobacco Use: Never    Passive Exposure: Not on file   Eligible for lung cancer screening? no     11/26/2022    9:13 AM  Depression screen PHQ 2/9  Decreased Interest 0  Down, Depressed, Hopeless 0  PHQ - 2 Score 0     Other providers/specialists: Patient Care Team: Jarold Motto, [place] as PCP - General (Physician Assistant) Marykay Lex, MD as PCP - Cardiology (Cardiology) Carlus Pavlov, MD as Consulting Physician (Internal Medicine) Antony Contras, MD as Consulting Physician (Ophthalmology) Tat, Octaviano Batty, DO as Consulting Physician (Neurology) [facility] as Consulting Physician (Optometry)   PMHx, SurgHx, SocialHx, Medications, and Allergies were reviewed in the Visit Navigator and updated as appropriate.   Past Medical History:  Diagnosis Date   Anemia    Arthritis    Controlled type 1 diabetes mellitus without complication (HCC) 06/22/2013   Hyperlipidemia 06/22/2013   Obesity, Class I, BMI 30-34.9 06/22/2013   Parkinson disease (HCC)    PONV (postoperative nausea and vomiting)    Post menopausal syndrome 06/22/2013   Primary hypothyroidism 06/22/2013   Vitamin D deficiency 09/24/2013    Past Surgical History:  Procedure Laterality Date   APPENDECTOMY     BREAST EXCISIONAL BIOPSY Left    papilloma   BREAST LUMPECTOMY WITH RADIOACTIVE SEED LOCALIZATION Left  06/10/2018   Procedure: LEFT BREAST LUMPECTOMY WITH RADIOACTIVE SEED LOCALIZATION;  Surgeon: Harriette Bouillon, MD;  Location: [facility];  Service: General;  Laterality: Left;   BREAST LUMPECTOMY WITH RADIOACTIVE SEED LOCALIZATION Right 04/21/2020   Procedure: RIGHT BREAST LUMPECTOMY WITH RADIOACTIVE SEED LOCALIZATION;  Surgeon: Harriette Bouillon, MD;  Location: [facility];  Service: General;  Laterality: Right;   CESAREAN SECTION  1985    TONSILLECTOMY     torn menisicus Left    Family History  Problem Relation Age of Onset   Hypertension Mother    High Cholesterol Mother    Coronary artery disease Mother 27       CABG   Tuberculosis Father    Hyperlipidemia Sister    Hypertension Sister    Hypothyroidism Sister    Other Sister        brain tumor; unsure if cancer   Hypothyroidism Sister    Heart attack Maternal Grandfather        Died from MI   Tuberculosis Paternal Grandmother    Cancer Paternal Grandfather    Coronary artery disease Maternal Uncle 58       CABG   Lung cancer Maternal Uncle        asbestosis   CAD Maternal Uncle 35       died - coronary aneurysm   Social History   Tobacco Use   Smoking status: Never   Smokeless tobacco: Never  Vaping Use   Vaping status: Never Used  Substance Use Topics   Alcohol use: No   Drug use: No   Review of Systems:   Review of Systems  Constitutional:  Negative for chills, fever, malaise/fatigue and weight loss.  HENT:  Negative for hearing loss, sinus pain and sore throat.   Respiratory:  Negative for cough and hemoptysis.   Cardiovascular:  Negative for chest pain, palpitations, leg swelling and PND.  Gastrointestinal:  Negative for abdominal pain, constipation, diarrhea, heartburn, nausea and vomiting.  Genitourinary:  Negative for dysuria, frequency and urgency.  Musculoskeletal:  Negative for back pain, myalgias and neck pain.  Skin:  Negative for itching and rash.  Neurological:  Negative for dizziness, tingling, seizures and headaches.  Endo/Heme/Allergies:  Negative for polydipsia.  Psychiatric/Behavioral:  Negative for depression. The patient is not nervous/anxious.       Objective:   BP 130/70 (BP Location: Left Arm, Patient Position: Sitting, Cuff Size: Normal)   Pulse 88   Temp 97.7 F (36.5 C) (Temporal)   Ht 5' 2.5" (1.588 m)   Wt 180 lb (81.6 kg)   SpO2 96%   BMI 32.40 kg/m  Body mass index is 32.4 kg/m.   General  Appearance:    Alert, cooperative, no distress, appears stated age  Head:    Normocephalic, without obvious abnormality, atraumatic  Eyes:    PERRL, conjunctiva/corneas clear, EOM's intact, fundi    benign, both eyes  Ears:    Normal TM's and external ear canals, both ears  Nose:   Nares normal, septum midline, mucosa normal, no drainage    or sinus tenderness  Throat:   Lips, mucosa, and tongue normal; teeth and gums normal  Neck:   Supple, symmetrical, trachea midline, no adenopathy;    thyroid:  no enlargement/tenderness/nodules; no carotid   bruit or JVD  Back:     Symmetric, no curvature, ROM normal, no CVA tenderness  Lungs:     Clear to auscultation bilaterally, respirations unlabored  Chest Wall:  No tenderness or deformity   Heart:    Regular rate and rhythm, S1 and S2 normal, no murmur, rub or gallop  Breast Exam:    Deferred  Abdomen:     Soft, non-tender, bowel sounds active all four quadrants,    no masses, no organomegaly  Genitalia:    Deferred  Extremities:   Extremities normal, atraumatic, no cyanosis or edema  Pulses:   2+ and symmetric all extremities  Skin:   Skin color, texture, turgor normal, no rashes or lesions  Lymph nodes:   Cervical, supraclavicular, and axillary nodes normal  Neurologic:   CNII-XII intact, normal strength, sensation and reflexes    throughout    Assessment/Plan:   Routine physical examination Today patient counseled on age appropriate routine health concerns for screening and prevention, each reviewed and up to date or declined. Immunizations reviewed and up to date or declined. Labs ordered and reviewed. Risk factors for depression reviewed and negative. Hearing function and visual acuity are intact. ADLs screened and addressed as needed. Functional ability and level of safety reviewed and appropriate. Education, counseling and referrals performed based on assessed risks today. Patient provided with a copy of personalized plan for  preventive services.  Vitamin D deficiency Update vitamin D and provide recommendations  Type 1 diabetes mellitus with hyperglycemia (HCC) Management per endo Reviewed most recent note She is trying to work on better dietary changes  Obesity, unspecified class, unspecified obesity type, unspecified whether serious comorbidity present Continue efforts at healthy lifestyle  Mixed diabetic hyperlipidemia associated with type 1 diabetes mellitus (HCC) Update lipid panel and provide recommendations to pravastatin 80 mg twice daily and zetia 10 mg daily accordingly  Primary hypothyroidism Update TSH and adjust levothyroxine 112 mcg daily   Need for immunization against influenza Completed  Parkinson's disease without dyskinesia or fluctuating manifestations (HCC) Management per neurology Most recent note reviewed Getting updated MRI tomorrow  I,Emily Lagle,acting as a scribe for [employer], PA.,have documented all relevant documentation on the behalf of Jarold Motto, PA,as directed by  Jarold Motto, PA while in the presence of Jarold Motto, [place].  I, Jarold Motto, [place], have reviewed all documentation for this visit. The documentation on 11/26/22 for the exam, diagnosis, procedures, and orders are all accurate and complete.  Jarold Motto, PA-C [facility]

## 2022-11-26 NOTE — Patient Instructions (Signed)
It was great to see you!  I will be in touch with all blood work results.  Please go to the lab for blood work.   Our office will call you with your results unless you have chosen to receive results via MyChart.  If your blood work is normal we will follow-up each year for physicals and as scheduled for chronic medical problems.  If anything is abnormal we will treat accordingly and get you in for a follow-up.  Take care,  Chelsea Mercado

## 2022-11-27 ENCOUNTER — Ambulatory Visit: Admission: RE | Admit: 2022-11-27 | Payer: 59 | Source: Ambulatory Visit

## 2022-11-27 LAB — GI PROFILE, STOOL, PCR

## 2022-11-30 ENCOUNTER — Encounter: Payer: Self-pay | Admitting: Neurology

## 2022-12-22 ENCOUNTER — Other Ambulatory Visit: Payer: Self-pay | Admitting: Family Medicine

## 2022-12-31 ENCOUNTER — Encounter: Payer: Self-pay | Admitting: Cardiovascular Disease

## 2022-12-31 ENCOUNTER — Ambulatory Visit: Payer: 59 | Attending: Cardiovascular Disease | Admitting: Cardiovascular Disease

## 2022-12-31 VITALS — BP 126/80 | Ht 62.5 in | Wt 180.0 lb

## 2022-12-31 DIAGNOSIS — E782 Mixed hyperlipidemia: Secondary | ICD-10-CM | POA: Diagnosis not present

## 2022-12-31 DIAGNOSIS — R931 Abnormal findings on diagnostic imaging of heart and coronary circulation: Secondary | ICD-10-CM

## 2022-12-31 DIAGNOSIS — R002 Palpitations: Secondary | ICD-10-CM | POA: Diagnosis not present

## 2022-12-31 DIAGNOSIS — E1069 Type 1 diabetes mellitus with other specified complication: Secondary | ICD-10-CM | POA: Diagnosis not present

## 2022-12-31 NOTE — Assessment & Plan Note (Signed)
Coronary calcium score performed 12/29/2020 was 13 all in the RCA.  She is completely asymptomatic.

## 2022-12-31 NOTE — Assessment & Plan Note (Signed)
History of hyperlipidemia on high-dose pravastatin and Zetia with lipid profile performed 11/26/2022 revealing total cholesterol 158, LDL of 86 and HDL 57.  She is not quite at goal for secondary prevention given her mildly elevated coronary calcium score.  We talked about diet and exercise and we will readdress in 12 months.  She has been on atorvastatin in the past which she will do.

## 2022-12-31 NOTE — Patient Instructions (Signed)
Medication Instructions:  Your physician recommends that you continue on your current medications as directed. Please refer to the Current Medication list given to you today.  *If you need a refill on your cardiac medications before your next appointment, please call your pharmacy*   Follow-Up: At [facility], you and your health needs are our priority.  As part of our continuing mission to provide you with exceptional heart care, we have created designated Provider Care Teams.  These Care Teams include your primary Cardiologist (physician) and Advanced Practice Providers (APPs -  Physician Assistants and Nurse Practitioners) who all work together to provide you with the care you need, when you need it.  We recommend signing up for the patient portal called "MyChart".  Sign up information is provided on this After Visit Summary.  MyChart is used to connect with patients for Virtual Visits (Telemedicine).  Patients are able to view lab/test results, encounter notes, upcoming appointments, etc.  Non-urgent messages can be sent to your provider as well.   To learn more about what you can do with MyChart, go to [email or web address].    Your next appointment:   12 month(s)  Provider:   Nanetta Batty, MD    Other Instructions Heart-Healthy Eating Plan Eating a healthy diet is important for the health of your heart. A heart-healthy eating plan includes: Eating less unhealthy fats. Eating more healthy fats. Eating less salt in your food. Salt is also called sodium. Making other changes in your diet. Cooking Avoid frying your food. Try to bake, boil, grill, or broil it instead. You can also reduce fat by: Removing the skin from poultry. Removing all visible fats from meats. Steaming vegetables in water or broth. Meal planning  At meals, divide your plate into four equal parts: Fill one-half of your plate with vegetables and green salads. Fill one-fourth of your plate with  whole grains. Fill one-fourth of your plate with lean protein foods. Eat 2-4 cups of vegetables per day. One cup of vegetables is: 1 cup (91 g) broccoli or cauliflower florets. 2 medium carrots. 1 large bell pepper. 1 large sweet potato. 1 large tomato. 1 medium white potato. 2 cups (150 g) raw leafy greens. Eat 1-2 cups of fruit per day. One cup of fruit is: 1 small apple 1 large banana 1 cup (237 g) mixed fruit, 1 large orange,  cup (82 g) dried fruit, 1 cup (240 mL) 100% fruit juice. Eat more foods that have soluble fiber. These are apples, broccoli, carrots, beans, peas, and barley. Try to get 20-30 g of fiber per day. Eat 4-5 servings of nuts, legumes, and seeds per week: 1 serving of dried beans or legumes equals  cup (90 g) cooked. 1 serving of nuts is  oz (12 almonds, 24 pistachios, or 7 walnut halves). 1 serving of seeds equals  oz (8 g). General information Eat more home-cooked food. Eat less restaurant, buffet, and fast food. Limit or avoid alcohol. Limit foods that are high in starch and sugar. Avoid fried foods. Lose weight if you are overweight. Keep track of how much salt (sodium) you eat. This is important if you have high blood pressure. Ask your doctor to tell you more about this. Try to add vegetarian meals each week. Fats Choose healthy fats. These include olive oil and canola oil, flaxseeds, walnuts, almonds, and seeds. Eat more omega-3 fats. These include salmon, mackerel, sardines, tuna, flaxseed oil, and ground flaxseeds. Try to eat fish at least 2 times each  week. Check food labels. Avoid foods with trans fats or high amounts of saturated fat. Limit saturated fats. These are often found in animal products, such as meats, butter, and cream. These are also found in plant foods, such as palm oil, palm kernel oil, and coconut oil. Avoid foods with partially hydrogenated oils in them. These have trans fats. Examples are stick margarine, some tub  margarines, cookies, crackers, and other baked goods. What foods should I eat? Fruits All fresh, canned (in natural juice), or frozen fruits. Vegetables Fresh or frozen vegetables (raw, steamed, roasted, or grilled). Green salads. Grains Most grains. Choose whole wheat and whole grains most of the time. Rice and pasta, including brown rice and pastas made with whole wheat. Meats and other proteins Lean, well-trimmed beef, veal, pork, and lamb. Chicken and [place] without skin. All fish and shellfish. Wild duck, rabbit, pheasant, and venison. Egg whites or low-cholesterol egg substitutes. Dried beans, peas, lentils, and tofu. Seeds and most nuts. Dairy Low-fat or nonfat cheeses, including ricotta and mozzarella. Skim or 1% milk that is liquid, powdered, or evaporated. Buttermilk that is made with low-fat milk. Nonfat or low-fat yogurt. Fats and oils Non-hydrogenated (trans-free) margarines. Vegetable oils, including soybean, sesame, sunflower, olive, peanut, safflower, corn, canola, and cottonseed. Salad dressings or mayonnaise made with a vegetable oil. Beverages Mineral water. Coffee and tea. Diet carbonated beverages. Sweets and desserts Sherbet, gelatin, and fruit ice. Small amounts of dark chocolate. Limit all sweets and desserts. Seasonings and condiments All seasonings and condiments. The items listed above may not be a complete list of foods and drinks you can eat. Contact a dietitian for more options. What foods should I avoid? Fruits Canned fruit in heavy syrup. Fruit in cream or butter sauce. Fried fruit. Limit coconut. Vegetables Vegetables cooked in cheese, cream, or butter sauce. Fried vegetables. Grains Breads that are made with saturated or trans fats, oils, or whole milk. Croissants. Sweet rolls. Donuts. High-fat crackers, such as cheese crackers. Meats and other proteins Fatty meats, such as hot dogs, ribs, sausage, bacon, rib-eye roast or steak. High-fat deli meats,  such as salami and bologna. Caviar. Domestic duck and goose. Organ meats, such as liver. Dairy Cream, sour cream, cream cheese, and creamed cottage cheese. Whole-milk cheeses. Whole or 2% milk that is liquid, evaporated, or condensed. Whole buttermilk. Cream sauce or high-fat cheese sauce. Yogurt that is made from whole milk. Fats and oils Meat fat, or shortening. Cocoa butter, hydrogenated oils, palm oil, coconut oil, palm kernel oil. Solid fats and shortenings, including bacon fat, salt pork, lard, and butter. Nondairy cream substitutes. Salad dressings with cheese or sour cream. Beverages Regular sodas and juice drinks with added sugar. Sweets and desserts Frosting. Pudding. Cookies. Cakes. Pies. Milk chocolate or white chocolate. Buttered syrups. Full-fat ice cream or ice cream drinks. The items listed above may not be a complete list of foods and drinks to avoid. Contact a dietitian for more information. Summary Heart-healthy meal planning includes eating less unhealthy fats, eating more healthy fats, and making other changes in your diet. Eat a balanced diet. This includes fruits and vegetables, low-fat or nonfat dairy, lean protein, nuts and legumes, whole grains, and heart-healthy oils and fats. This information is not intended to replace advice given to you by your health care provider. Make sure you discuss any questions you have with your health care provider. Document Revised: 01/30/2021 Document Reviewed: 01/30/2021 Elsevier Patient Education  2024 [employer].

## 2022-12-31 NOTE — Assessment & Plan Note (Signed)
History of palpitations in the past with event monitoring showing occasional PACs, PVCs and short runs of SVT.  Since I saw her last she has eliminated caffeine from her diet and her palpitations have markedly improved.

## 2022-12-31 NOTE — Progress Notes (Signed)
12/31/2022 Chelsea Mercado   06-27-1960  [id number]  Primary Physician Jarold Motto, PA Primary Cardiologist: Runell Gess MD Nicholes Calamity, [place]  HPI:  Chelsea Mercado is a 62 y.o.  mild to moderately overweight married Caucasian female mother of 3, grandmother of 5 grandchildren who currently works at the [employer] in [employer] center. She was referred by Jarold Motto, PA-C for palpitations. She saw Dr. Bryan Lemma remotely (03/19/2018) for palpitations as well. She does admit to drinking caffeine. She is a type I diabetic and has hyperlipidemia not at goal on high-dose pravastatin and Zetia. Her mother had bypass surgery in her 4s. She is never had a heart attack or stroke. She denies chest pain or shortness of breath. She may have symptoms of sleep apnea. She has had palpitations for years that occur weekly and are fairly annoying.  I saw her 8 months ago she has limited caffeine from her diet and her palpitations have resolved.  She did have a 2D echocardiogram performed/23/24 which was essentially normal and an event monitor that showed occasional PACs, PVCs and short runs of SVT.  In addition, she had a coronary calcium score performed 12/29/2020 which was 13.   Current Meds  Medication Sig   ACCU-CHEK GUIDE test strip USE AS INSTRUCTED 3-4 X A DAY   Accu-Chek Softclix Lancets lancets Use as instructed to check blood sugar 4 times daily   Blood Glucose Monitoring Suppl (ACCU-CHEK GUIDE) w/Device KIT AccuChek Guide LINK - the only meter approved to communicate with her Medtronic 770G insulin pump   carbidopa-levodopa (SINEMET IR) 25-100 MG tablet TAKE 1 TABLET BY MOUTH THREE TIMES A DAY   Cholecalciferol (VITAMIN D3) 5000 units CAPS Take 1 capsule by mouth daily.    ezetimibe (ZETIA) 10 MG tablet TAKE 1 TABLET BY MOUTH EVERY DAY   fexofenadine (ALLEGRA) 180 MG tablet Take 180 mg by mouth daily. Taking 1 daily   Glucagon 3 MG/DOSE POWD Place 3 mg into the nose  once as needed for up to 1 dose.   HUMALOG 100 UNIT/ML injection USE AS DIRECTED PER PUMP TAKE MAXIMUM 50 UNITS DAILY   Insulin Syringe-Needle U-100 (BD VEO INSULIN SYRINGE U/F) 31G X 15/64" 0.3 ML MISC Use to inject insulin as needed.   levothyroxine (SYNTHROID) 112 MCG tablet Take 1 tablet (112 mcg total) by mouth daily.   pravastatin (PRAVACHOL) 80 MG tablet TAKE 1 TABLET BY MOUTH EVERY DAY   ramipril (ALTACE) 10 MG capsule TAKE 1 CAPSULE BY MOUTH EVERY DAY   vitamin B-12 (CYANOCOBALAMIN) 500 MCG tablet Take 1,000 mcg by mouth daily.   [DISCONTINUED] ketoconazole (NIZORAL) 2 % cream Apply to affected area 1-2 times daily     Allergies  Allergen Reactions   Hydrocodone-Acetaminophen Nausea And Vomiting   Phenergan [Promethazine]     Diagnosed with parkinsons, instructed by neurologist not to take phenergan    Propoxyphene Nausea Only   Tape Rash    Social History   Socioeconomic History   Marital status: Married    Spouse name: Not on file   Number of children: 3   Years of education: 12   Highest education level: High school graduate  Occupational History   Not on file  Tobacco Use   Smoking status: Never   Smokeless tobacco: Never  Vaping Use   Vaping status: Never Used  Substance and Sexual Activity   Alcohol use: No   Drug use: No   Sexual activity: Yes  Partners: Male  Other Topics Concern   Not on file  Social History Narrative   Married mother of 3, grandmother 5.  Lives with her husband and son.   Worked as Agricultural engineer       Social Drivers of Corporate investment banker Strain: Not on file  Food Insecurity: Low Risk  (11/06/2022)   Received from [facility]   Hunger Vital Sign    Worried About Running Out of Food in the Last Year: Never true    Ran Out of Food in the Last Year: Never true  Transportation Needs: No Transportation Needs (11/06/2022)   Received from [facility]    In the past 12 months, has lack of reliable  transportation kept you from medical appointments, meetings, work or from getting things needed for daily living? : No  Physical Activity: Not on file  Stress: Not on file  Social Connections: Not on file  Intimate Partner Violence: Not on file     Review of Systems: General: negative for chills, fever, night sweats or weight changes.  Cardiovascular: negative for chest pain, dyspnea on exertion, edema, orthopnea, palpitations, paroxysmal nocturnal dyspnea or shortness of breath Dermatological: negative for rash Respiratory: negative for cough or wheezing Urologic: negative for hematuria Abdominal: negative for nausea, vomiting, diarrhea, bright red blood per rectum, melena, or hematemesis Neurologic: negative for visual changes, syncope, or dizziness All other systems reviewed and are otherwise negative except as noted above.    Blood pressure 126/80, height 5' 2.5" (1.588 m), weight 180 lb (81.6 kg), SpO2 98%.  General appearance: alert and no distress Neck: no adenopathy, no carotid bruit, no JVD, supple, symmetrical, trachea midline, and thyroid not enlarged, symmetric, no tenderness/mass/nodules Lungs: clear to auscultation bilaterally Heart: regular rate and rhythm, S1, S2 normal, no murmur, click, rub or gallop Extremities: extremities normal, atraumatic, no cyanosis or edema Pulses: 2+ and symmetric Skin: Skin color, texture, turgor normal. No rashes or lesions Neurologic: Grossly normal  EKG EKG Interpretation Date/Time:  Monday December 31 2022 11:13:14 EST Ventricular Rate:  75 PR Interval:  140 QRS Duration:  70 QT Interval:  370 QTC Calculation: 413 R Axis:   35  Text Interpretation: Normal sinus rhythm Normal ECG When compared with ECG of 18-Apr-2020 11:44, No significant change was found Confirmed by Nanetta Batty [phone]) on 12/31/2022 11:27:40 AM    ASSESSMENT AND PLAN:   Mixed diabetic hyperlipidemia associated with type 1 diabetes mellitus (HCC) History  of hyperlipidemia on high-dose pravastatin and Zetia with lipid profile performed 11/26/2022 revealing total cholesterol 158, LDL of 86 and HDL 57.  She is not quite at goal for secondary prevention given her mildly elevated coronary calcium score.  We talked about diet and exercise and we will readdress in 12 months.  She has been on atorvastatin in the past which she will do.  Palpitations History of palpitations in the past with event monitoring showing occasional PACs, PVCs and short runs of SVT.  Since I saw her last she has eliminated caffeine from her diet and her palpitations have markedly improved.  Elevated coronary artery calcium score Coronary calcium score performed 12/29/2020 was 13 all in the RCA.  She is completely asymptomatic.     Runell Gess MD FACP,FACC,FAHA, [facility] 12/31/2022 11:37 AM

## 2023-01-09 ENCOUNTER — Other Ambulatory Visit: Payer: Self-pay | Admitting: Physician Assistant

## 2023-02-25 ENCOUNTER — Other Ambulatory Visit: Payer: Self-pay | Admitting: Neurology

## 2023-02-25 DIAGNOSIS — G20A1 Parkinson's disease without dyskinesia, without mention of fluctuations: Secondary | ICD-10-CM

## 2023-03-11 ENCOUNTER — Other Ambulatory Visit: Payer: Self-pay | Admitting: Neurology

## 2023-03-11 DIAGNOSIS — G20A1 Parkinson's disease without dyskinesia, without mention of fluctuations: Secondary | ICD-10-CM

## 2023-03-27 LAB — HEMOGLOBIN A1C: Hemoglobin A1C: 8.1

## 2023-04-22 NOTE — Progress Notes (Unsigned)
 All   Assessment/Plan:   1.  Parkinsons Disease  -Continue carbidopa/levodopa 25/100, 1 tablet 3 times per day.    -discussed exercise and needs to increase that   -We discussed that it used to be thought that levodopa would increase risk of melanoma but now it is believed that Parkinsons itself likely increases risk of melanoma. she is to get regular skin checks.  Following with [facility] dermatology  2.  Palpitations  -Cardiology workup was negative and they felt no medications were needed.  She does have an elevated coronary calcium score, mild, and diet and exercise will be important here as well.    Subjective:   Chelsea Mercado was seen today in follow up for Parkinsons disease.  My previous records were reviewed prior to todays visit as well as outside records available to me.  Last visit, she was describing an episode that she had where she was talking to her neighbor and her words would not come out right.  It was just 1 single episode.  We decided to do an MRI of the brain, but ultimately the patient decided not to go through with that, mostly because she was starting to have a panic attack when she went into the machine.  She has had no further recurrent episodes.  She saw her cardiologist December 23.  She does have a elevated coronary calcium score and they talked about diet and exercise.  She is exercising.  No falls.    Current prescribed movement disorder medications: Carbidopa/levodopa 25/100, 1 tablet 3 times per day    ALLERGIES:   Allergies  Allergen Reactions   Hydrocodone-Acetaminophen Nausea And Vomiting   Phenergan [Promethazine]     Diagnosed with parkinsons, instructed by neurologist not to take phenergan    Propoxyphene Nausea Only   Tape Rash    CURRENT MEDICATIONS:  Outpatient Encounter Medications as of 04/23/2023  Medication Sig   ACCU-CHEK GUIDE test strip USE AS INSTRUCTED 3-4 X A DAY   Accu-Chek Softclix Lancets lancets Use as instructed to check  blood sugar 4 times daily   Blood Glucose Monitoring Suppl (ACCU-CHEK GUIDE) w/Device KIT AccuChek Guide LINK - the only meter approved to communicate with her Medtronic 770G insulin pump   carbidopa-levodopa (SINEMET IR) 25-100 MG tablet TAKE 1 TABLET BY MOUTH THREE TIMES A DAY   Cholecalciferol (VITAMIN D3) 5000 units CAPS Take 1 capsule by mouth daily.    ezetimibe (ZETIA) 10 MG tablet TAKE 1 TABLET BY MOUTH EVERY DAY   fexofenadine (ALLEGRA) 180 MG tablet Take 180 mg by mouth daily. Taking 1 daily   Glucagon 3 MG/DOSE POWD Place 3 mg into the nose once as needed for up to 1 dose.   HUMALOG 100 UNIT/ML injection USE AS DIRECTED PER PUMP TAKE MAXIMUM 50 UNITS DAILY   Insulin Syringe-Needle U-100 (BD VEO INSULIN SYRINGE U/F) 31G X 15/64" 0.3 ML MISC Use to inject insulin as needed.   levothyroxine (SYNTHROID) 112 MCG tablet Take 1 tablet (112 mcg total) by mouth daily.   pravastatin (PRAVACHOL) 80 MG tablet TAKE 1 TABLET BY MOUTH EVERY DAY   ramipril (ALTACE) 10 MG capsule TAKE 1 CAPSULE BY MOUTH EVERY DAY   vitamin B-12 (CYANOCOBALAMIN) 500 MCG tablet Take 1,000 mcg by mouth daily.   No facility-administered encounter medications on file as of 04/23/2023.    Objective:   PHYSICAL EXAMINATION:    VITALS:   Vitals:   04/23/23 1029  BP: 136/80  Pulse: 81  SpO2: 97%  Weight: 178 lb (80.7 kg)  Height: 5\' 4"  (1.626 m)    Wt Readings from Last 3 Encounters:  04/23/23 178 lb (80.7 kg)  12/31/22 180 lb (81.6 kg)  11/26/22 180 lb (81.6 kg)    GEN:  The patient appears stated age and is in NAD. HEENT:  Normocephalic, atraumatic.  The mucous membranes are moist. The superficial temporal arteries are without ropiness or tenderness. Cardiovascular: Regular rate rhythm Lungs: Clear to auscultation bilaterally Neck: No bruits  Neurological examination:  Orientation: The patient is alert and oriented x3. Cranial nerves: There is good facial symmetry with mild facial hypomimia. EOMI.   The speech is fluent and clear. Soft palate rises symmetrically and there is no tongue deviation. Hearing is intact to conversational tone. Sensation: Sensation is intact to light touch throughout Motor: Strength is at least antigravity x4.  Movement examination:  Tone: There is nl tone in the UE/LE Abnormal movements: there is no tremor Coordination:  There is no decremation, with any form of RAMS, including alternating supination and pronation of the forearm, hand opening and closing, finger taps, heel taps and toe taps. Gait and Station: The patient has no difficulty arising out of a deep-seated chair without the use of the hands. The patient's stride length is good.  She has good armswing.  Exam is stable  I have reviewed and interpreted the following labs independently    Chemistry      Component Value Date/Time   NA 140 11/20/2022 1238   K 4.4 11/20/2022 1238   CL 103 11/20/2022 1238   CO2 29 11/20/2022 1238   BUN 14 11/20/2022 1238   CREATININE 0.86 11/20/2022 1238   CREATININE 0.83 09/28/2020 1058      Component Value Date/Time   CALCIUM 9.4 11/20/2022 1238   ALKPHOS 55 11/20/2022 1238   AST 20 11/20/2022 1238   ALT 3 11/20/2022 1238   BILITOT 0.4 11/20/2022 1238   BILITOT 0.4 05/21/2022 0824       Lab Results  Component Value Date   WBC 6.1 11/20/2022   HGB 14.0 11/20/2022   HCT 41.3 11/20/2022   MCV 90.1 11/20/2022   PLT 263.0 11/20/2022    Lab Results  Component Value Date   TSH 0.67 11/26/2022      Cc:  Alexander Iba, [place]

## 2023-04-23 ENCOUNTER — Ambulatory Visit: Payer: 59 | Admitting: Neurology

## 2023-04-23 ENCOUNTER — Encounter: Payer: Self-pay | Admitting: Neurology

## 2023-04-23 VITALS — BP 136/80 | HR 81 | Ht 64.0 in | Wt 178.0 lb

## 2023-04-23 DIAGNOSIS — G20A1 Parkinson's disease without dyskinesia, without mention of fluctuations: Secondary | ICD-10-CM | POA: Diagnosis not present

## 2023-04-25 ENCOUNTER — Encounter: Payer: Self-pay | Admitting: Family

## 2023-04-25 ENCOUNTER — Ambulatory Visit: Admitting: Family

## 2023-04-25 VITALS — BP 133/78 | HR 74 | Temp 97.3°F | Ht 64.0 in | Wt 180.4 lb

## 2023-04-25 DIAGNOSIS — R220 Localized swelling, mass and lump, head: Secondary | ICD-10-CM

## 2023-04-25 DIAGNOSIS — N3090 Cystitis, unspecified without hematuria: Secondary | ICD-10-CM

## 2023-04-25 LAB — POCT URINALYSIS DIPSTICK
Bilirubin, UA: NEGATIVE
Blood, UA: NEGATIVE
Glucose, UA: NEGATIVE
Ketones, UA: NEGATIVE
Nitrite, UA: NEGATIVE
Protein, UA: NEGATIVE
Spec Grav, UA: >=1.03 — AB (ref 1.010–1.025)
Urobilinogen, UA: 0.2 U/dL
pH, UA: 6 (ref 5.0–8.0)

## 2023-04-25 MED ORDER — NITROFURANTOIN MONOHYD MACRO 100 MG PO CAPS
100.0000 mg | ORAL_CAPSULE | Freq: Two times a day (BID) | ORAL | 0 refills | Status: DC
Start: 2023-04-25 — End: 2023-05-27

## 2023-04-25 NOTE — Progress Notes (Signed)
 Patient ID: Chelsea Mercado, female    DOB: 1960-06-07, 63 y.o.   MRN: [id number]  Chief Complaint  Patient presents with   Urinary Frequency    Pt c/o urinary frequency, flank pain and pelvic pain. Present for a couple of days.   Discussed the use of AI scribe software for clinical note transcription with the patient, who gave verbal consent to proceed.  History of Present Illness The patient, with a history of diabetes managed with an insulin pump, presents with urinary frequency and pain. She suspects a urinary tract infection (UTI), although she has not had one in years. She reports that her blood sugar levels have been elevated, reaching the 300s yesterday, but have since improved to 154 today. She denies dysuria and has not taken any over-the-counter medications for her symptoms. She denies any recent diarrhea or antibiotic use. The patient also reports feeling tired and has had a worsening voice and headache, which she attributes to allergies. She has had chronic swelling on the right side of her nose, which has become tender to touch. She has a history of nosebleeds and has seen an ear, nose, and throat doctor in the past.   Assessment & Plan Urinary Tract Infection (UTI) Mild UTI confirmed by urine test. Symptoms warrant treatment. - Prescribed Macrobid (nitrofurantoin) for 7 days. - Send urine for culture. - Advised increased water intake. - Instructed to check MyChart for culture results and contact on-call provider if needed over the holiday weekend. - Advised to stop antibiotics if culture shows no growth.  Nasal Swelling and Epistaxis Persistent nasal swelling with family history of sinus polyps. Discussed sinus scan to rule out polyps or growths. - Consider ordering a sinus scan via PCP to rule out polyps or other growths   Subjective:    Outpatient Medications Prior to Visit  Medication Sig Dispense Refill   ACCU-CHEK GUIDE test strip USE AS INSTRUCTED 3-4 X A DAY 400  strip 0   Accu-Chek Softclix Lancets lancets Use as instructed to check blood sugar 4 times daily 400 each 3   Blood Glucose Monitoring Suppl (ACCU-CHEK GUIDE) w/Device KIT AccuChek Guide LINK - the only meter approved to communicate with her Medtronic 770G insulin pump 1 kit 0   carbidopa-levodopa (SINEMET IR) 25-100 MG tablet TAKE 1 TABLET BY MOUTH THREE TIMES A DAY 270 tablet 0   Cholecalciferol (VITAMIN D3) 5000 units CAPS Take 1 capsule by mouth daily.      ezetimibe (ZETIA) 10 MG tablet TAKE 1 TABLET BY MOUTH EVERY DAY 90 tablet 3   fexofenadine (ALLEGRA) 180 MG tablet Take 180 mg by mouth daily. Taking 1 daily     Glucagon 3 MG/DOSE POWD Place 3 mg into the nose once as needed for up to 1 dose. 1 each 11   HUMALOG 100 UNIT/ML injection USE AS DIRECTED PER PUMP TAKE MAXIMUM 50 UNITS DAILY 50 mL 1   Insulin Syringe-Needle U-100 (BD VEO INSULIN SYRINGE U/F) 31G X 15/64" 0.3 ML MISC Use to inject insulin as needed. 10 each 5   levothyroxine (SYNTHROID) 112 MCG tablet Take 1 tablet (112 mcg total) by mouth daily. 45 tablet 3   pravastatin (PRAVACHOL) 80 MG tablet TAKE 1 TABLET BY MOUTH EVERY DAY 90 tablet 3   ramipril (ALTACE) 10 MG capsule TAKE 1 CAPSULE BY MOUTH EVERY DAY 90 capsule 3   vitamin B-12 (CYANOCOBALAMIN) 500 MCG tablet Take 1,000 mcg by mouth daily.     No facility-administered medications prior to  visit.   Past Medical History:  Diagnosis Date   Anemia    Arthritis    Controlled type 1 diabetes mellitus without complication (HCC) 06/22/2013   Hyperlipidemia 06/22/2013   Obesity, Class I, BMI 30-34.9 06/22/2013   Parkinson disease (HCC)    PONV (postoperative nausea and vomiting)    Post menopausal syndrome 06/22/2013   Primary hypothyroidism 06/22/2013   Vitamin D deficiency 09/24/2013   Past Surgical History:  Procedure Laterality Date   APPENDECTOMY     BREAST EXCISIONAL BIOPSY Left    papilloma   BREAST LUMPECTOMY WITH RADIOACTIVE SEED LOCALIZATION Left  06/10/2018   Procedure: LEFT BREAST LUMPECTOMY WITH RADIOACTIVE SEED LOCALIZATION;  Surgeon: Sim Dryer, MD;  Location: [facility];  Service: General;  Laterality: Left;   BREAST LUMPECTOMY WITH RADIOACTIVE SEED LOCALIZATION Right 04/21/2020   Procedure: RIGHT BREAST LUMPECTOMY WITH RADIOACTIVE SEED LOCALIZATION;  Surgeon: Sim Dryer, MD;  Location: [facility];  Service: General;  Laterality: Right;   CESAREAN SECTION  1985   TONSILLECTOMY     torn menisicus Left    Allergies  Allergen Reactions   Hydrocodone-Acetaminophen Nausea And Vomiting   Phenergan [Promethazine]     Diagnosed with parkinsons, instructed by neurologist not to take phenergan    Propoxyphene Nausea Only   Tape Rash      Objective:    Physical Exam Vitals and nursing note reviewed.  Constitutional:      Appearance: Normal appearance.  Cardiovascular:     Rate and Rhythm: Normal rate and regular rhythm.  Pulmonary:     Effort: Pulmonary effort is normal.     Breath sounds: Normal breath sounds.  Musculoskeletal:        General: Normal range of motion.  Skin:    General: Skin is warm and dry.  Neurological:     Mental Status: She is alert.  Psychiatric:        Mood and Affect: Mood normal.        Behavior: Behavior normal.    BP 133/78 (BP Location: Left Arm, Patient Position: Sitting, Cuff Size: Large)   Pulse 74   Temp (!) 97.3 F (36.3 C) (Temporal)   Ht 5\' 4"  (1.626 m)   Wt 180 lb 6.4 oz (81.8 kg)   SpO2 98%   BMI 30.97 kg/m  Wt Readings from Last 3 Encounters:  04/25/23 180 lb 6.4 oz (81.8 kg)  04/23/23 178 lb (80.7 kg)  12/31/22 180 lb (81.6 kg)      Versa Gore, NP

## 2023-04-26 LAB — URINE CULTURE
MICRO NUMBER:: 16343009
Result:: NO GROWTH
SPECIMEN QUALITY:: ADEQUATE

## 2023-04-30 ENCOUNTER — Encounter: Payer: Self-pay | Admitting: Family

## 2023-05-09 ENCOUNTER — Other Ambulatory Visit: Payer: Self-pay | Admitting: Physician Assistant

## 2023-05-09 DIAGNOSIS — Z0000 Encounter for general adult medical examination without abnormal findings: Secondary | ICD-10-CM

## 2023-05-20 ENCOUNTER — Encounter (HOSPITAL_COMMUNITY): Payer: Self-pay

## 2023-05-23 ENCOUNTER — Other Ambulatory Visit: Payer: Self-pay | Admitting: Medical Genetics

## 2023-05-27 ENCOUNTER — Other Ambulatory Visit: Payer: Self-pay | Admitting: Neurology

## 2023-05-27 ENCOUNTER — Ambulatory Visit
Admission: RE | Admit: 2023-05-27 | Discharge: 2023-05-27 | Disposition: A | Discharge status: Home / Self Care | Source: Ambulatory Visit | Attending: Physician Assistant | Admitting: Physician Assistant

## 2023-05-27 ENCOUNTER — Encounter: Payer: Self-pay | Admitting: Physician Assistant

## 2023-05-27 ENCOUNTER — Ambulatory Visit: Payer: 59 | Admitting: Physician Assistant

## 2023-05-27 VITALS — BP 120/68 | HR 83 | Temp 97.2°F | Ht 64.0 in | Wt 183.0 lb

## 2023-05-27 DIAGNOSIS — R220 Localized swelling, mass and lump, head: Secondary | ICD-10-CM

## 2023-05-27 DIAGNOSIS — R29818 Other symptoms and signs involving the nervous system: Secondary | ICD-10-CM

## 2023-05-27 DIAGNOSIS — E1065 Type 1 diabetes mellitus with hyperglycemia: Secondary | ICD-10-CM

## 2023-05-27 DIAGNOSIS — E1069 Type 1 diabetes mellitus with other specified complication: Secondary | ICD-10-CM | POA: Diagnosis not present

## 2023-05-27 DIAGNOSIS — E782 Mixed hyperlipidemia: Secondary | ICD-10-CM

## 2023-05-27 DIAGNOSIS — M79672 Pain in left foot: Secondary | ICD-10-CM

## 2023-05-27 DIAGNOSIS — G20A1 Parkinson's disease without dyskinesia, without mention of fluctuations: Secondary | ICD-10-CM

## 2023-05-27 DIAGNOSIS — E039 Hypothyroidism, unspecified: Secondary | ICD-10-CM

## 2023-05-27 DIAGNOSIS — Z0000 Encounter for general adult medical examination without abnormal findings: Secondary | ICD-10-CM

## 2023-05-27 MED ORDER — GLUCAGON 3 MG/DOSE NA POWD: 3.0000 mg | Freq: Once | NASAL | 11 refills | Status: AC | PRN

## 2023-05-27 NOTE — Patient Instructions (Addendum)
 Chelsea Mercado

## 2023-05-27 NOTE — Progress Notes (Signed)
 Chelsea Mercado is a 63 y.o. female here for a follow up of a pre-existing problem.  History of Present Illness:   Chief Complaint  Patient presents with   Medical Management of Chronic Issues    Type I Diabetes A1c done 03/27/2023 was 8.1, Hyperlipidemia, Hypothyroidism   Facial Swelling    Pt c/o facial swelling around nose area.   Foot Problem    Pt c/o pain arch of left foot radiates to top of foot x 6 months.     HPI   Nasal swelling Pt complains of facial swelling around both sides of her nose Was having some regular nasal bleeding but this has slowed down She saw Dr Starling Eck in the past for her nose bleeds; no cautery per patient report She has used nasal spray in the past but wasn't sure if it was causing headache(s) so she discontinued When she touches the swelling on her nose, it causes her to have increase of phlegm in post-nasal area   Left foot pain: Pt complains of pain in the arch of her left foot that radiates to the top of her foot starting 6 months ago.  She used to have high arches and now has more of a flat foot She has seen [facility] in the past for her knee Her pain can limit her activity   HLD Pt is on Pravastatin  80 mg once daily and Zetia  10 mg once daily. Good compliance and tolerance.    Diabetes Getting new insulin  pump in June Planning to get a Dexcom Good compliance and tolerance. Still working on getting healthier food choices down; working on some regular exercise as able 3/19 A1c was 8/1. Uses humalog  -- 50 units max daily  This pt's diabetes is complicated by HLD.   Hypothyroidism Pt is on Levothyroxine  112 mcg once daily. Good compliance and tolerance.   Parkinsons She is well controlled She is UpToDate with care from neurology She takes sinemet  ir 25-100 mg three times daily    Possible OSA Her applewatch has notified her a few times of possible obstructive sleep apnea She sometimes feels like her heart rate increases  more than it should when walking around her home Denies chest pain, shortness of breath     Past Medical History:  Diagnosis Date   Anemia    Arthritis    Controlled type 1 diabetes mellitus without complication (HCC) 06/22/2013   Hyperlipidemia 06/22/2013   Obesity, Class I, BMI 30-34.9 06/22/2013   Parkinson disease (HCC)    PONV (postoperative nausea and vomiting)    Post menopausal syndrome 06/22/2013   Primary hypothyroidism 06/22/2013   Vitamin D  deficiency 09/24/2013     Social History   Tobacco Use   Smoking status: Never   Smokeless tobacco: Never  Vaping Use   Vaping status: Never Used  Substance Use Topics   Alcohol use: No   Drug use: No    Past Surgical History:  Procedure Laterality Date   APPENDECTOMY     BREAST EXCISIONAL BIOPSY Left    papilloma   BREAST LUMPECTOMY WITH RADIOACTIVE SEED LOCALIZATION Left 06/10/2018   Procedure: LEFT BREAST LUMPECTOMY WITH RADIOACTIVE SEED LOCALIZATION;  Surgeon: Sim Dryer, MD;  Location: [facility];  Service: General;  Laterality: Left;   BREAST LUMPECTOMY WITH RADIOACTIVE SEED LOCALIZATION Right 04/21/2020   Procedure: RIGHT BREAST LUMPECTOMY WITH RADIOACTIVE SEED LOCALIZATION;  Surgeon: Sim Dryer, MD;  Location: [facility];  Service: General;  Laterality: Right;  CESAREAN SECTION  1985   TONSILLECTOMY     torn menisicus Left     Family History  Problem Relation Age of Onset   Hypertension Mother    High Cholesterol Mother    Coronary artery disease Mother 32       CABG   Tuberculosis Father    Hyperlipidemia Sister    Hypertension Sister    Hypothyroidism Sister    Other Sister        brain tumor; unsure if cancer   Hypothyroidism Sister    Heart attack Maternal Grandfather        Died from MI   Tuberculosis Paternal Grandmother    Cancer Paternal Grandfather    Coronary artery disease Maternal Uncle 41       CABG   Lung cancer Maternal Uncle         asbestosis   CAD Maternal Uncle 35       died - coronary aneurysm    Allergies  Allergen Reactions   Hydrocodone-Acetaminophen  Nausea And Vomiting   Phenergan [Promethazine]     Diagnosed with parkinsons, instructed by neurologist not to take phenergan    Propoxyphene Nausea Only   Tape Rash    Current Medications:   Current Outpatient Medications:    ACCU-CHEK GUIDE test strip, USE AS INSTRUCTED 3-4 X A DAY, Disp: 400 strip, Rfl: 0   Accu-Chek Softclix Lancets lancets, Use as instructed to check blood sugar 4 times daily, Disp: 400 each, Rfl: 3   Blood Glucose Monitoring Suppl (ACCU-CHEK GUIDE) w/Device KIT, AccuChek Guide LINK - the only meter approved to communicate with her Medtronic 770G insulin  pump, Disp: 1 kit, Rfl: 0   carbidopa -levodopa  (SINEMET  IR) 25-100 MG tablet, TAKE 1 TABLET BY MOUTH THREE TIMES A DAY, Disp: 270 tablet, Rfl: 0   Cholecalciferol (VITAMIN D3) 5000 units CAPS, Take 1 capsule by mouth daily. , Disp: , Rfl:    ezetimibe  (ZETIA ) 10 MG tablet, TAKE 1 TABLET BY MOUTH EVERY DAY, Disp: 90 tablet, Rfl: 3   fexofenadine (ALLEGRA) 180 MG tablet, Take 180 mg by mouth daily. Taking 1 daily, Disp: , Rfl:    HUMALOG  100 UNIT/ML injection, USE AS DIRECTED PER PUMP TAKE MAXIMUM 50 UNITS DAILY, Disp: 50 mL, Rfl: 1   Insulin  Syringe-Needle U-100 (BD VEO INSULIN  SYRINGE U/F) 31G X 15/64" 0.3 ML MISC, Use to inject insulin  as needed., Disp: 10 each, Rfl: 5   levothyroxine  (SYNTHROID ) 112 MCG tablet, Take 1 tablet (112 mcg total) by mouth daily., Disp: 45 tablet, Rfl: 3   pravastatin  (PRAVACHOL ) 80 MG tablet, TAKE 1 TABLET BY MOUTH EVERY DAY, Disp: 90 tablet, Rfl: 3   ramipril  (ALTACE ) 10 MG capsule, TAKE 1 CAPSULE BY MOUTH EVERY DAY, Disp: 90 capsule, Rfl: 3   Glucagon  3 MG/DOSE POWD, Place 3 mg into the nose once as needed for up to 1 dose., Disp: 1 each, Rfl: 11   Review of Systems:   Negative unless otherwise specified per HPI.  Vitals:   Vitals:   05/27/23 0854   BP: 120/68  Pulse: 83  Temp: (!) 97.2 F (36.2 C)  TempSrc: Temporal  SpO2: 97%  Weight: 183 lb (83 kg)  Height: 5\' 4"  (1.626 m)     Body mass index is 31.41 kg/m.  Physical Exam:   Physical Exam Vitals and nursing note reviewed.  Constitutional:      General: She is not in acute distress.    Appearance: She is well-developed. She is not  ill-appearing or toxic-appearing.  HENT:     Nose:     Comments: Visible swelling to bilateral aspects of nasal bone Cardiovascular:     Rate and Rhythm: Normal rate and regular rhythm.     Pulses: Normal pulses.     Heart sounds: Normal heart sounds, S1 normal and S2 normal.  Pulmonary:     Effort: Pulmonary effort is normal.     Breath sounds: Normal breath sounds.  Musculoskeletal:       Feet:  Skin:    General: Skin is warm and dry.  Neurological:     Mental Status: She is alert.     GCS: GCS eye subscore is 4. GCS verbal subscore is 5. GCS motor subscore is 6.  Psychiatric:        Speech: Speech normal.        Behavior: Behavior normal. Behavior is cooperative.     Assessment and Plan:   1. Nasal swelling (Primary) Unclear etiology Recommend restarting nasal spray She would like to see new ENT -- will place referral If symptom(s) significantly worsen in the interim, consider CT scan of sinuses - Ambulatory referral to ENT  2. Left foot pain She is planning to call [facility] to schedule an appointment to follow-up on this issue She will let us  know if she needs a referral or any other needs  3. Mixed diabetic hyperlipidemia associated with type 1 diabetes mellitus (HCC) Continue pravastatin  80 mg daily and Zetia  10 mg daily  4. Type 1 diabetes mellitus with hyperglycemia (HCC) She is up-to-date with her endocrinologist  She is compliant with care Continue efforts at healthy diet and exercise as able  5. Primary hypothyroidism Compliant with levothyroxine  112 mcg daily  6. Parkinson's disease without  dyskinesia or fluctuating manifestations (HCC) Well-controlled and compliant with care from neurology  7. Suspected sleep apnea Recommended sleep studies referral but she declined   I, Timoteo Force, acting as a Neurosurgeon for [employer], [place]., have documented all relevant documentation on the behalf of Alexander Iba, [place], as directed by  Alexander Iba, PA while in the presence of Alexander Iba, [place].  I, Alexander Iba, [place], have reviewed all documentation for this visit. The documentation on 05/27/23 for the exam, diagnosis, procedures, and orders are all accurate and complete.  Alexander Iba, PA-C

## 2023-05-31 ENCOUNTER — Other Ambulatory Visit (HOSPITAL_COMMUNITY)
Admission: RE | Admit: 2023-05-31 | Discharge: 2023-05-31 | Disposition: A | Discharge status: Home / Self Care | Payer: Self-pay | Source: Ambulatory Visit | Attending: Oncology | Admitting: Oncology

## 2023-06-11 LAB — GENECONNECT MOLECULAR SCREEN: Genetic Analysis Overall Interpretation: NEGATIVE

## 2023-07-02 ENCOUNTER — Encounter (INDEPENDENT_AMBULATORY_CARE_PROVIDER_SITE_OTHER): Payer: Self-pay

## 2023-07-04 ENCOUNTER — Encounter: Payer: Self-pay | Admitting: Family

## 2023-07-04 ENCOUNTER — Ambulatory Visit: Payer: Self-pay

## 2023-07-04 ENCOUNTER — Ambulatory Visit: Admitting: Family

## 2023-07-04 VITALS — BP 122/73 | HR 74 | Temp 97.7°F | Ht 64.0 in | Wt 179.0 lb

## 2023-07-04 DIAGNOSIS — B839 Helminthiasis, unspecified: Secondary | ICD-10-CM

## 2023-07-04 DIAGNOSIS — H00012 Hordeolum externum right lower eyelid: Secondary | ICD-10-CM

## 2023-07-04 MED ORDER — ERYTHROMYCIN 5 MG/GM OP OINT: 1.0000 | TOPICAL_OINTMENT | Freq: Two times a day (BID) | OPHTHALMIC | 0 refills | Status: AC

## 2023-07-04 NOTE — Telephone Encounter (Signed)
 Please see pt msg and pics in regards to today's visit

## 2023-07-04 NOTE — Telephone Encounter (Signed)
 FYI Only or Action Required?: FYI only for provider.  Patient was last seen in primary care on 05/27/2023 by Job Lukes, PA. Called Nurse Triage reporting Worms. Symptoms began several days ago. Interventions attempted: Nothing. Symptoms are: unchanged.  Triage Disposition: See Physician Within 24 Hours  Patient/caregiver understands and will follow disposition?: Yes  Copied from CRM [id number]. Topic: Clinical - Red Word Triage >> Jul 04, 2023  9:53 AM [name] wrote: [facility] that prompted transfer to Nurse Triage: patient stated she think she has worms Reason for Disposition  Passed a worm by rectum  Answer Assessment - Initial Assessment Questions 1. APPEARANCE of WORM: What does it look like and does it move?     Looks like earth worm, does not move 2. SIZE: How long is the worm?     Few inches 3. LOCATION: Where did it come from? (e.g., from rectum, in stool, through mouth)     Stool 4. WHEN: When did it happen?     2 days ago, then again today 5. OTHER SYMPTOMS: Do you have any other symptoms? (e.g., stomach pain, vomiting)     Itching  6. TRAVEL: Have you traveled internationally in the last 2 to 3 months?     No  Protocols used: Worms - Other Than Pinworms-A-AH

## 2023-07-04 NOTE — Progress Notes (Signed)
 Patient ID: Chelsea Mercado, female    DOB: 16-Dec-1960, 63 y.o.   MRN: [id number]  Chief Complaint  Patient presents with   worms    Seen in poop, noticed about 2 days ago; nothing done differently, no travels; no new pets in the home; no pain; normal bowels except for one day being loose  Discussed the use of AI scribe software for clinical note transcription with the patient, who gave verbal consent to proceed.  History of Present Illness Chelsea Mercado is a 63 year old female who presents with the presence of worms in her stool.  She observed worms resembling earthworms with dark spots in her stool a couple of days ago, expelled with soft stool. She has not traveled recently, been exposed to new pets, or had contact with animals. She has an Art therapist. There has been no consumption of spoiled food or visits to petting zoos. She recently ate pork that was frozen and then cooked. Occasional cramping occurs but resolves quickly. She experienced two episodes of sudden nausea and feeling 'funky in the head' at [facility], without consistent or severe stomach cramping and rectal itching. She has hemorrhoids with occasional itching and burning.  Assessment & Plan Suspected Helminthiasis Bily reports worm-like structures in her stool, first seen 2 days ago and 2nd this morning. Pictures uploaded to MyChart.  Discussed stool kit for parasite identification and potential treatments including ivermectin, azithromycin , and albendazole. Advised on hand hygiene and toilet cleaning to prevent transmission. - Provide stool kit for sample collection, pt to go to [address] [place] location to pick 2 stool collection containers, 1 pink one for actual worm, and a black one for stool sample. - Investigate to identify the type of worm. - Provide treatment for type of infestation if diagnosis is confirmed.  Recurrent Stye Stye on her right lower eyelid with erythema and pinpoint white bump, persisting for several weeks. She  has tried applying warm compresses, which has drawn it out more instead of absorbing. Suggested trying a different mascara and avoid using on lower eyelashes until stye is healed. - Prescribe erythromycin ointment for eyelid application, can apply using Q-tip. - Advise using baby shampoo to clean eyelids to prevent future styes. - Recommend finding non-flaking mascara.  Subjective:    Outpatient Medications Prior to Visit  Medication Sig Dispense Refill   ACCU-CHEK GUIDE test strip USE AS INSTRUCTED 3-4 X A DAY 400 strip 0   Accu-Chek Softclix Lancets lancets Use as instructed to check blood sugar 4 times daily 400 each 3   Blood Glucose Monitoring Suppl (ACCU-CHEK GUIDE) w/Device KIT AccuChek Guide LINK - the only meter approved to communicate with her Medtronic 770G insulin  pump 1 kit 0   carbidopa -levodopa  (SINEMET  IR) 25-100 MG tablet TAKE 1 TABLET BY MOUTH THREE TIMES A DAY 270 tablet 0   Cholecalciferol (VITAMIN D3) 5000 units CAPS Take 1 capsule by mouth daily.      Continuous Glucose Sensor (DEXCOM G7 SENSOR) MISC Use as directed to monitor blood sugar levels. Follow package directions for replacement.     ezetimibe  (ZETIA ) 10 MG tablet TAKE 1 TABLET BY MOUTH EVERY DAY 90 tablet 3   fexofenadine (ALLEGRA) 180 MG tablet Take 180 mg by mouth daily. Taking 1 daily     Glucagon  3 MG/DOSE POWD Place 3 mg into the nose once as needed for up to 1 dose. 1 each 11   HUMALOG  100 UNIT/ML injection USE AS DIRECTED PER PUMP TAKE MAXIMUM 50 UNITS  DAILY 50 mL 1   Insulin  Syringe-Needle U-100 (BD VEO INSULIN  SYRINGE U/F) 31G X 15/64 0.3 ML MISC Use to inject insulin  as needed. 10 each 5   levothyroxine  (SYNTHROID ) 112 MCG tablet Take 1 tablet (112 mcg total) by mouth daily. 45 tablet 3   pravastatin  (PRAVACHOL ) 80 MG tablet TAKE 1 TABLET BY MOUTH EVERY DAY 90 tablet 3   ramipril  (ALTACE ) 10 MG capsule TAKE 1 CAPSULE BY MOUTH EVERY DAY 90 capsule 3   No facility-administered medications prior to  visit.   Past Medical History:  Diagnosis Date   Anemia    Arthritis    Controlled type 1 diabetes mellitus without complication (HCC) 06/22/2013   Hyperlipidemia 06/22/2013   Obesity, Class I, BMI 30-34.9 06/22/2013   Parkinson disease (HCC)    PONV (postoperative nausea and vomiting)    Post menopausal syndrome 06/22/2013   Primary hypothyroidism 06/22/2013   Vitamin D  deficiency 09/24/2013   Past Surgical History:  Procedure Laterality Date   APPENDECTOMY     BREAST EXCISIONAL BIOPSY Left 06/10/2018   papilloma   BREAST EXCISIONAL BIOPSY Right 04/21/2020   papilloma   BREAST LUMPECTOMY WITH RADIOACTIVE SEED LOCALIZATION Left 06/10/2018   Procedure: LEFT BREAST LUMPECTOMY WITH RADIOACTIVE SEED LOCALIZATION;  Surgeon: Vanderbilt Ned, MD;  Location: [facility];  Service: General;  Laterality: Left;   BREAST LUMPECTOMY WITH RADIOACTIVE SEED LOCALIZATION Right 04/21/2020   Procedure: RIGHT BREAST LUMPECTOMY WITH RADIOACTIVE SEED LOCALIZATION;  Surgeon: Vanderbilt Ned, MD;  Location: [facility];  Service: General;  Laterality: Right;   CESAREAN SECTION  1985   TONSILLECTOMY     torn menisicus Left    Allergies  Allergen Reactions   Hydrocodone-Acetaminophen  Nausea And Vomiting   Phenergan [Promethazine]     Diagnosed with parkinsons, instructed by neurologist not to take phenergan    Propoxyphene Nausea Only   Silicone Dermatitis   Tape Rash      Objective:    Physical Exam Vitals and nursing note reviewed.  Constitutional:      Appearance: Normal appearance.   Eyes:     General:        Right eye: Hordeolum (lower eyelid, swollen bump with white pinpoint spot surrounded with erythema) present.     Conjunctiva/sclera:     Right eye: Right conjunctiva is injected (lower eyelid).    Cardiovascular:     Rate and Rhythm: Normal rate and regular rhythm.  Pulmonary:     Effort: Pulmonary effort is normal.     Breath sounds: Normal  breath sounds.   Musculoskeletal:        General: Normal range of motion.   Skin:    General: Skin is warm and dry.   Neurological:     Mental Status: She is alert.   Psychiatric:        Mood and Affect: Mood normal.        Behavior: Behavior normal.    BP 122/73   Pulse 74   Temp 97.7 F (36.5 C)   Ht 5' 4 (1.626 m)   Wt 179 lb (81.2 kg)   SpO2 99%   BMI 30.73 kg/m  Wt Readings from Last 3 Encounters:  07/04/23 179 lb (81.2 kg)  05/27/23 183 lb (83 kg)  04/25/23 180 lb 6.4 oz (81.8 kg)      Lucius Krabbe, NP

## 2023-07-05 ENCOUNTER — Other Ambulatory Visit

## 2023-07-05 DIAGNOSIS — B839 Helminthiasis, unspecified: Secondary | ICD-10-CM

## 2023-07-10 ENCOUNTER — Ambulatory Visit: Payer: Self-pay | Admitting: Family

## 2023-07-10 DIAGNOSIS — B839 Helminthiasis, unspecified: Secondary | ICD-10-CM

## 2023-07-10 LAB — PARASITE ID, WORM

## 2023-07-10 LAB — SPECIMEN STATUS REPORT

## 2023-07-11 LAB — OVA AND PARASITE EXAMINATION

## 2023-07-11 MED ORDER — ALBENDAZOLE 200 MG PO TABS
400.0000 mg | ORAL_TABLET | Freq: Every day | ORAL | 0 refills | Status: DC
Start: 2023-07-11 — End: 2023-11-27

## 2023-07-16 ENCOUNTER — Institutional Professional Consult (permissible substitution) (INDEPENDENT_AMBULATORY_CARE_PROVIDER_SITE_OTHER): Admitting: Otolaryngology

## 2023-08-23 ENCOUNTER — Other Ambulatory Visit: Payer: Self-pay | Admitting: Neurology

## 2023-08-23 DIAGNOSIS — G20A1 Parkinson's disease without dyskinesia, without mention of fluctuations: Secondary | ICD-10-CM

## 2023-11-12 NOTE — Progress Notes (Unsigned)
 Assessment/Plan:   1.  Parkinsons Disease  -Continue carbidopa /levodopa  25/100, 1 tablet 3 times per day.    -We discussed that it used to be thought that levodopa  would increase risk of melanoma but now it is believed that Parkinsons itself likely increases risk of melanoma. she is to get regular skin checks.  Following with [facility] dermatology  2.  Palpitations  -Cardiology workup was negative and they felt no medications were needed.  She does have an elevated coronary calcium  score, mild, and diet and exercise will be important here as well.  3.  Type 1 diabetes  - Follows with endocrinology at [facility].  4.  Occ insomnia  -discussed melatonin and Mg glycinate.  She worries about taking something too strong as she wants to be able to be alerted at night if her blood sugar drops too low.  I told her I think that both melatonin and magnesium glycinate are fine to be able to take at night.  Subjective:   Chelsea Mercado was seen today in follow up for Parkinsons disease.  My previous records were reviewed prior to todays visit as well as outside records available to me.  Patient continues to do well with her Parkinson's disease.  She takes her levodopa  faithfully.  She has no falls.  No hallucinations.  Notes some occasional insomnia.  She is following with endocrinology at [facility] regarding type 1 diabetes mellitus.  Notes are reviewed.  Not exercising as much as I should be.   Current prescribed movement disorder medications: Carbidopa /levodopa  25/100, 1 tablet 3 times per day    ALLERGIES:   Allergies  Allergen Reactions   Hydrocodone-Acetaminophen  Nausea And Vomiting   Phenergan [Promethazine]     Diagnosed with parkinsons, instructed by neurologist not to take phenergan    Propoxyphene Nausea Only   Silicone Dermatitis   Tape Rash    CURRENT MEDICATIONS:  Outpatient Encounter Medications as of 11/14/2023  Medication Sig   ACCU-CHEK GUIDE test strip USE AS INSTRUCTED  3-4 X A DAY   Accu-Chek Softclix Lancets lancets Use as instructed to check blood sugar 4 times daily   albendazole  (ALBENZA ) 200 MG tablet Take 2 tablets (400 mg total) by mouth daily.   Blood Glucose Monitoring Suppl (ACCU-CHEK GUIDE) w/Device KIT AccuChek Guide LINK - the only meter approved to communicate with her Medtronic 770G insulin  pump   carbidopa -levodopa  (SINEMET  IR) 25-100 MG tablet TAKE 1 TABLET BY MOUTH THREE TIMES A DAY   Cholecalciferol (VITAMIN D3) 5000 units CAPS Take 1 capsule by mouth daily.    Continuous Glucose Sensor (DEXCOM G7 SENSOR) MISC Use as directed to monitor blood sugar levels. Follow package directions for replacement.   ezetimibe  (ZETIA ) 10 MG tablet TAKE 1 TABLET BY MOUTH EVERY DAY   fexofenadine (ALLEGRA) 180 MG tablet Take 180 mg by mouth daily. Taking 1 daily   Glucagon  3 MG/DOSE POWD Place 3 mg into the nose once as needed for up to 1 dose.   HUMALOG  100 UNIT/ML injection USE AS DIRECTED PER PUMP TAKE MAXIMUM 50 UNITS DAILY   Insulin  Syringe-Needle U-100 (BD VEO INSULIN  SYRINGE U/F) 31G X 15/64 0.3 ML MISC Use to inject insulin  as needed.   levothyroxine  (SYNTHROID ) 112 MCG tablet Take 1 tablet (112 mcg total) by mouth daily.   pravastatin  (PRAVACHOL ) 80 MG tablet TAKE 1 TABLET BY MOUTH EVERY DAY   ramipril  (ALTACE ) 10 MG capsule TAKE 1 CAPSULE BY MOUTH EVERY DAY   No facility-administered encounter medications on  file as of 11/14/2023.    Objective:   PHYSICAL EXAMINATION:    VITALS:   Vitals:   11/14/23 1030  BP: 126/76  Pulse: 64  SpO2: 98%  Weight: 172 lb (78 kg)     Wt Readings from Last 3 Encounters:  11/14/23 172 lb (78 kg)  07/04/23 179 lb (81.2 kg)  05/27/23 183 lb (83 kg)    GEN:  The patient appears stated age and is in NAD. HEENT:  Normocephalic, atraumatic.  The mucous membranes are moist. The superficial temporal arteries are without ropiness or tenderness. Cardiovascular: Regular rate rhythm Lungs: Clear to auscultation  bilaterally Neck: No bruits  Neurological examination:  Orientation: The patient is alert and oriented x3. Cranial nerves: There is good facial symmetry with mild facial hypomimia. EOMI.  The speech is fluent and clear. Soft palate rises symmetrically and there is no tongue deviation. Hearing is intact to conversational tone. Sensation: Sensation is intact to light touch throughout Motor: Strength is at least antigravity x4.  Movement examination:  Tone: There is nl tone in the UE/LE Abnormal movements: there is no tremor (stable) Coordination:  There is no decremation, with any form of RAMS, including alternating supination and pronation of the forearm, hand opening and closing, finger taps, heel taps and toe taps.  (Stable) Gait and Station: The patient has no difficulty arising out of a deep-seated chair without the use of the hands. The patient's stride length is good.  She has good armswing.  I have reviewed and interpreted the following labs independently    Chemistry      Component Value Date/Time   NA 140 11/20/2022 1238   K 4.4 11/20/2022 1238   CL 103 11/20/2022 1238   CO2 29 11/20/2022 1238   BUN 14 11/20/2022 1238   CREATININE 0.86 11/20/2022 1238   CREATININE 0.83 09/28/2020 1058      Component Value Date/Time   CALCIUM  9.4 11/20/2022 1238   ALKPHOS 55 11/20/2022 1238   AST 20 11/20/2022 1238   ALT 3 11/20/2022 1238   BILITOT 0.4 11/20/2022 1238   BILITOT 0.4 05/21/2022 0824       Lab Results  Component Value Date   WBC 6.1 11/20/2022   HGB 14.0 11/20/2022   HCT 41.3 11/20/2022   MCV 90.1 11/20/2022   PLT 263.0 11/20/2022    Lab Results  Component Value Date   TSH 0.67 11/26/2022     Cc:  Job Lukes, [place]

## 2023-11-14 ENCOUNTER — Ambulatory Visit: Admitting: Neurology

## 2023-11-14 ENCOUNTER — Encounter: Payer: Self-pay | Admitting: Neurology

## 2023-11-14 VITALS — BP 126/76 | HR 64 | Wt 172.0 lb

## 2023-11-14 DIAGNOSIS — G20A1 Parkinson's disease without dyskinesia, without mention of fluctuations: Secondary | ICD-10-CM

## 2023-11-14 DIAGNOSIS — G4700 Insomnia, unspecified: Secondary | ICD-10-CM | POA: Diagnosis not present

## 2023-11-14 MED ORDER — CARBIDOPA-LEVODOPA 25-100 MG PO TABS: 1.0000 | ORAL_TABLET | Freq: Three times a day (TID) | ORAL | 1 refills | Status: AC

## 2023-11-14 NOTE — Patient Instructions (Signed)
 Here are some resources/books that you may find helpful as you navigate the challenges of Parkinson's Disease  1.  Parkinson's treatement: 10 secrets to a happier life by Jefferey Pica, MD 2.  Navigating Life with Parkinsons disease by Sotirios Parashos 3.  My degeneration: A journey through Parkinsons Ledora Bottcher - Shohl) 4.  Every Victory counts (I believe this one if free through [employer]) 5.  Lucky Man by Gardner Candle 6.  101 Questions & Answers about Parkinson's by Caprice Renshaw 7.  Parkinsons Disease Treatment Book by JE Ahiskog

## 2023-11-20 LAB — OPHTHALMOLOGY REPORT-SCANNED

## 2023-11-27 ENCOUNTER — Encounter: Payer: Self-pay | Admitting: Physician Assistant

## 2023-11-27 ENCOUNTER — Ambulatory Visit (INDEPENDENT_AMBULATORY_CARE_PROVIDER_SITE_OTHER): Payer: 59 | Admitting: Physician Assistant

## 2023-11-27 VITALS — BP 132/70 | HR 78 | Temp 97.9°F | Ht 62.5 in | Wt 172.2 lb

## 2023-11-27 DIAGNOSIS — E1065 Type 1 diabetes mellitus with hyperglycemia: Secondary | ICD-10-CM | POA: Diagnosis not present

## 2023-11-27 DIAGNOSIS — E782 Mixed hyperlipidemia: Secondary | ICD-10-CM

## 2023-11-27 DIAGNOSIS — N393 Stress incontinence (female) (male): Secondary | ICD-10-CM

## 2023-11-27 DIAGNOSIS — E559 Vitamin D deficiency, unspecified: Secondary | ICD-10-CM

## 2023-11-27 DIAGNOSIS — Z1211 Encounter for screening for malignant neoplasm of colon: Secondary | ICD-10-CM

## 2023-11-27 DIAGNOSIS — G20A1 Parkinson's disease without dyskinesia, without mention of fluctuations: Secondary | ICD-10-CM | POA: Diagnosis not present

## 2023-11-27 DIAGNOSIS — Z0000 Encounter for general adult medical examination without abnormal findings: Secondary | ICD-10-CM | POA: Diagnosis not present

## 2023-11-27 DIAGNOSIS — E1069 Type 1 diabetes mellitus with other specified complication: Secondary | ICD-10-CM

## 2023-11-27 DIAGNOSIS — E039 Hypothyroidism, unspecified: Secondary | ICD-10-CM

## 2023-11-27 LAB — CBC WITH DIFFERENTIAL/PLATELET
Basophils Absolute: 0 K/uL (ref 0.0–0.1)
Basophils Relative: 0.6 % (ref 0.0–3.0)
Eosinophils Absolute: 0.2 K/uL (ref 0.0–0.7)
Eosinophils Relative: 3.7 % (ref 0.0–5.0)
HCT: 41.9 % (ref 36.0–46.0)
Hemoglobin: 14.1 g/dL (ref 12.0–15.0)
Lymphocytes Relative: 27 % (ref 12.0–46.0)
Lymphs Abs: 1.6 K/uL (ref 0.7–4.0)
MCHC: 33.6 g/dL (ref 30.0–36.0)
MCV: 90.5 fl (ref 78.0–100.0)
Monocytes Absolute: 0.5 K/uL (ref 0.1–1.0)
Monocytes Relative: 7.5 % (ref 3.0–12.0)
Neutro Abs: 3.7 K/uL (ref 1.4–7.7)
Neutrophils Relative %: 61.2 % (ref 43.0–77.0)
Platelets: 288 K/uL (ref 150.0–400.0)
RBC: 4.63 Mil/uL (ref 3.87–5.11)
RDW: 13.4 % (ref 11.5–15.5)
WBC: 6.1 K/uL (ref 4.0–10.5)

## 2023-11-27 LAB — COMPREHENSIVE METABOLIC PANEL WITH GFR
ALT: 7 U/L (ref 0–35)
AST: 19 U/L (ref 0–37)
Albumin: 4.2 g/dL (ref 3.5–5.2)
Alkaline Phosphatase: 55 U/L (ref 39–117)
BUN: 16 mg/dL (ref 6–23)
CO2: 32 meq/L (ref 19–32)
Calcium: 9.5 mg/dL (ref 8.4–10.5)
Chloride: 99 meq/L (ref 96–112)
Creatinine, Ser: 0.84 mg/dL (ref 0.40–1.20)
GFR: 74.01 mL/min (ref >60.00)
Glucose, Bld: 153 mg/dL — ABNORMAL HIGH (ref 70–99)
Potassium: 5.1 meq/L (ref 3.5–5.1)
Sodium: 138 meq/L (ref 135–145)
Total Bilirubin: 0.5 mg/dL (ref 0.2–1.2)
Total Protein: 6.9 g/dL (ref 6.0–8.3)

## 2023-11-27 LAB — LIPID PANEL
Cholesterol: 136 mg/dL (ref 0–200)
HDL: 61.1 mg/dL
LDL Cholesterol: 59 mg/dL (ref 0–99)
NonHDL: 75
Total CHOL/HDL Ratio: 2
Triglycerides: 79 mg/dL (ref 0.0–149.0)
VLDL: 15.8 mg/dL (ref 0.0–40.0)

## 2023-11-27 LAB — MICROALBUMIN / CREATININE URINE RATIO
Creatinine,U: 91.2 mg/dL
Microalb Creat Ratio: UNDETERMINED mg/g (ref 0.0–30.0)
Microalb, Ur: <0.7 mg/dL

## 2023-11-27 LAB — TSH: TSH: 0.39 u[IU]/mL (ref 0.35–5.50)

## 2023-11-27 LAB — VITAMIN D 25 HYDROXY (VIT D DEFICIENCY, FRACTURES): VITD: 50.3 ng/mL (ref 30.00–100.00)

## 2023-11-27 NOTE — Patient Instructions (Signed)
 It was great to see you!  Vaccines -- you are due for pneumonia, shingles and flu.  Take care,  Lucie Buttner PA-C

## 2023-11-27 NOTE — Progress Notes (Signed)
 Subjective:    Chelsea Mercado is a 63 y.o. female and is here for a comprehensive physical exam.  HPI  Health Maintenance Due  Topic Date Due   Diabetic kidney evaluation - Urine ACR  Never done   DTaP/Tdap/Td (1 - Tdap) Never done   Pneumococcal Vaccine: 50+ Years (2 of 2 - PCV) 11/19/2017   HEMOGLOBIN A1C  09/27/2023   Diabetic kidney evaluation - eGFR measurement  11/20/2023   Cervical Cancer Screening (HPV/Pap Cotest)  12/31/2023    Discussed the use of AI scribe software for clinical note transcription with the patient, who gave verbal consent to proceed.  History of Present Illness   Chelsea Mercado is a 63 year old female with Parkinson's disease and diabetes who presents for a routine follow-up visit.  Her neurologist has maintained her current medication dosage for Parkinson's disease. Her diabetes management includes the use of insulin  pumps, with an A1c of 7.4. She transitioned from Medtronic to Omnipod with Dexcom 7, and then to T slim, experiencing issues with Dexcom 7 CGM accuracy and adhesive, leading to stress and sleep disturbances. She now prefers fingerstick blood glucose monitoring.  She has mild urinary incontinence, particularly with coughing and sneezing, and uses a pad daily. She has a persistent sty since March, unresponsive to light treatments, with a procedure scheduled.  She has lost nine pounds through lifestyle changes and is not using Ozempic, preferring to manage her weight through lifestyle modifications. She engages in regular physical activity, though not daily. No vaginal bleeding or headaches.       Health Maintenance: Immunizations -- overdue for pneumonia, shingles, flu Colonoscopy -- cologaurd due  Mammogram -- UpToDate  PAP -- overdue -- she would like to do next year Bone Density -- N/A  Diet -- healthy Exercise -- as able  Sleep habits -- no major concerns Mood -- stable  UTD with dentist? - yes UTD with eye doctor? - yes  Weight  history: Wt Readings from Last 10 Encounters:  11/27/23 172 lb 4 oz (78.1 kg)  11/14/23 172 lb (78 kg)  07/04/23 179 lb (81.2 kg)  05/27/23 183 lb (83 kg)  04/25/23 180 lb 6.4 oz (81.8 kg)  04/23/23 178 lb (80.7 kg)  12/31/22 180 lb (81.6 kg)  11/26/22 180 lb (81.6 kg)  11/20/22 180 lb 6.1 oz (81.8 kg)  10/23/22 178 lb (80.7 kg)   Body mass index is 31 kg/m. No LMP recorded. Patient is postmenopausal.  Alcohol use:  reports no history of alcohol use.  Tobacco use:  Tobacco Use: Low Risk  (11/27/2023)   Patient History    Smoking Tobacco Use: Never    Smokeless Tobacco Use: Never    Passive Exposure: Not on file   Eligible for lung cancer screening? no     11/27/2023    9:09 AM  Depression screen PHQ 2/9  Decreased Interest 0  Down, Depressed, Hopeless 0  PHQ - 2 Score 0     Other providers/specialists: Patient Care Team: Job Lukes, [place] as PCP - General (Physician Assistant) Anner Alm ORN, MD as PCP - Cardiology (Cardiology) Trixie File, MD as Consulting Physician (Internal Medicine) Charmayne Molly, MD as Consulting Physician (Ophthalmology) Tat, Asberry RAMAN, DO as Consulting Physician (Neurology) [facility] as Consulting Physician (Optometry)    PMHx, SurgHx, SocialHx, Medications, and Allergies were reviewed in the Visit Navigator and updated as appropriate.   Past Medical History:  Diagnosis Date   Anemia    Arthritis  Controlled type 1 diabetes mellitus without complication (HCC) 06/22/2013   Hyperlipidemia 06/22/2013   Obesity, Class I, BMI 30-34.9 06/22/2013   Parkinson disease (HCC)    PONV (postoperative nausea and vomiting)    Post menopausal syndrome 06/22/2013   Primary hypothyroidism 06/22/2013   Vitamin D  deficiency 09/24/2013     Past Surgical History:  Procedure Laterality Date   APPENDECTOMY     BREAST EXCISIONAL BIOPSY Left 06/10/2018   papilloma   BREAST EXCISIONAL BIOPSY Right 04/21/2020   papilloma    BREAST LUMPECTOMY WITH RADIOACTIVE SEED LOCALIZATION Left 06/10/2018   Procedure: LEFT BREAST LUMPECTOMY WITH RADIOACTIVE SEED LOCALIZATION;  Surgeon: Vanderbilt Ned, MD;  Location: [facility];  Service: General;  Laterality: Left;   BREAST LUMPECTOMY WITH RADIOACTIVE SEED LOCALIZATION Right 04/21/2020   Procedure: RIGHT BREAST LUMPECTOMY WITH RADIOACTIVE SEED LOCALIZATION;  Surgeon: Vanderbilt Ned, MD;  Location: [facility];  Service: General;  Laterality: Right;   CESAREAN SECTION  1985   TONSILLECTOMY     torn menisicus Left      Family History  Problem Relation Age of Onset   Hypertension Mother    High Cholesterol Mother    Coronary artery disease Mother 38       CABG   Tuberculosis Father    Hyperlipidemia Sister    Hypertension Sister    Hypothyroidism Sister    Other Sister        brain tumor; unsure if cancer   Hypothyroidism Sister    Coronary artery disease Maternal Uncle 11       CABG   Lung cancer Maternal Uncle        asbestosis   CAD Maternal Uncle 35       died - coronary aneurysm   Heart attack Maternal Grandfather        Died from MI   Tuberculosis Paternal Grandmother    Cancer Paternal Grandfather    Breast cancer Neg Hx    BRCA 1/2 Neg Hx     Social History   Tobacco Use   Smoking status: Never   Smokeless tobacco: Never  Vaping Use   Vaping status: Never Used  Substance Use Topics   Alcohol use: No   Drug use: No    Review of Systems:   Review of Systems  Constitutional:  Negative for chills, fever, malaise/fatigue and weight loss.  HENT:  Negative for hearing loss, sinus pain and sore throat.   Respiratory:  Negative for cough and hemoptysis.   Cardiovascular:  Negative for chest pain, palpitations, leg swelling and PND.  Gastrointestinal:  Negative for abdominal pain, constipation, diarrhea, heartburn, nausea and vomiting.  Genitourinary:  Negative for dysuria, frequency and urgency.  Musculoskeletal:   Negative for back pain, myalgias and neck pain.  Skin:  Negative for itching and rash.  Neurological:  Negative for dizziness, tingling, seizures and headaches.  Endo/Heme/Allergies:  Negative for polydipsia.  Psychiatric/Behavioral:  Negative for depression. The patient is not nervous/anxious.     Objective:   BP 132/70 (BP Location: Left Arm, Patient Position: Sitting, Cuff Size: Large)   Pulse 78   Temp 97.9 F (36.6 C) (Temporal)   Ht 5' 2.5 (1.588 m)   Wt 172 lb 4 oz (78.1 kg)   SpO2 97%   BMI 31.00 kg/m  Body mass index is 31 kg/m.   General Appearance:    Alert, cooperative, no distress, appears stated age  Head:    Normocephalic, without obvious abnormality, atraumatic  Eyes:    PERRL, conjunctiva/corneas clear, EOM's intact, fundi    benign, both eyes  Ears:    Normal TM's and external ear canals, both ears  Nose:   Nares normal, septum midline, mucosa normal, no drainage    or sinus tenderness  Throat:   Lips, mucosa, and tongue normal; teeth and gums normal  Neck:   Supple, symmetrical, trachea midline, no adenopathy;    thyroid :  no enlargement/tenderness/nodules; no carotid   bruit or JVD  Back:     Symmetric, no curvature, ROM normal, no CVA tenderness  Lungs:     Clear to auscultation bilaterally, respirations unlabored  Chest Wall:    No tenderness or deformity   Heart:    Regular rate and rhythm, S1 and S2 normal, no murmur, rub or gallop  Breast Exam:    Deferred  Abdomen:     Soft, non-tender, bowel sounds active all four quadrants,    no masses, no organomegaly  Genitalia:    Deferred   Extremities:   Extremities normal, atraumatic, no cyanosis or edema  Pulses:   2+ and symmetric all extremities  Skin:   Skin color, texture, turgor normal, no rashes or lesions  Lymph nodes:   Cervical, supraclavicular, and axillary nodes normal  Neurologic:   CNII-XII intact, normal strength, sensation and reflexes    throughout    Assessment/Plan:   Assessment  and Plan    Adult Wellness Visit Routine adult wellness visit with no acute concerns. Discussed Pap smear, colonoscopy, and vaccinations. - Will schedule Pap smear for next visit. - Ordered Cologuard for colorectal cancer screening. - Recommended pneumonia vaccine. - Recommended shingles vaccine. - Recommended flu vaccine.  Type 1 Diabetes Mellitus Type 1 diabetes managed with insulin  pump. Recent issues with Dexcom CGM and T-slim pump. A1c is 7.4. Prefers fingerstick glucose monitoring due to CGM inaccuracies. Discussed challenges with CGM and insulin  pump management. - Continue current insulin  regimen with T-slim pump. - Monitor blood glucose with fingerstick. - Checked urine for protein.  Parkinson's disease without dyskinesia or fluctuating manifestations (HCC)  Parkinson's disease well-managed with current medication regimen. Neurologist indicates current management is effective. - Continue current Parkinson's medication regimen.  Hypothyroidism Managed by endocrinologist. TSH not checked recently. Discussed management responsibilities between endocrinologist and primary care. - Continue current thyroid  management. - Checked TSH levels.  Vitamin D  Deficiency Vitamin D  deficiency managed with daily supplementation. - Continue vitamin D  supplementation.  Stress Urinary Incontinence Stress urinary incontinence with occasional episodes. Discussed pelvic floor physical therapy and pessary as potential interventions. - Will consider referral to urogynecology for further evaluation and management.     Lucie Buttner, PA-C  [facility]

## 2023-11-29 ENCOUNTER — Ambulatory Visit: Payer: Self-pay | Admitting: Physician Assistant

## 2023-12-10 LAB — COLOGUARD: COLOGUARD: NEGATIVE

## 2023-12-29 ENCOUNTER — Other Ambulatory Visit: Payer: Self-pay | Admitting: Physician Assistant

## 2024-05-15 ENCOUNTER — Ambulatory Visit: Admitting: Neurology

## 2024-11-27 ENCOUNTER — Encounter: Admitting: Physician Assistant
